# Patient Record
Sex: Male | Born: 1935
Health system: Southern US, Community
[De-identification: ages and names within clinical notes are randomized; demographics above are authoritative.]

## PROBLEM LIST (undated history)

## (undated) DIAGNOSIS — M1990 Unspecified osteoarthritis, unspecified site: Secondary | ICD-10-CM

## (undated) DIAGNOSIS — E119 Type 2 diabetes mellitus without complications: Secondary | ICD-10-CM

## (undated) DIAGNOSIS — C801 Malignant (primary) neoplasm, unspecified: Secondary | ICD-10-CM

## (undated) DIAGNOSIS — J439 Emphysema, unspecified: Secondary | ICD-10-CM

## (undated) DIAGNOSIS — Z95810 Presence of automatic (implantable) cardiac defibrillator: Secondary | ICD-10-CM

## (undated) DIAGNOSIS — E1142 Type 2 diabetes mellitus with diabetic polyneuropathy: Secondary | ICD-10-CM

## (undated) DIAGNOSIS — M1A00X0 Idiopathic chronic gout, unspecified site, without tophus (tophi): Secondary | ICD-10-CM

## (undated) DIAGNOSIS — M1A9XX0 Chronic gout, unspecified, without tophus (tophi): Secondary | ICD-10-CM

## (undated) DIAGNOSIS — I428 Other cardiomyopathies: Secondary | ICD-10-CM

## (undated) DIAGNOSIS — E538 Deficiency of other specified B group vitamins: Secondary | ICD-10-CM

## (undated) DIAGNOSIS — Z8673 Personal history of transient ischemic attack (TIA), and cerebral infarction without residual deficits: Secondary | ICD-10-CM

## (undated) DIAGNOSIS — E785 Hyperlipidemia, unspecified: Secondary | ICD-10-CM

## (undated) DIAGNOSIS — I447 Left bundle-branch block, unspecified: Secondary | ICD-10-CM

## (undated) DIAGNOSIS — I700 Atherosclerosis of aorta: Secondary | ICD-10-CM

## (undated) DIAGNOSIS — I6523 Occlusion and stenosis of bilateral carotid arteries: Secondary | ICD-10-CM

## (undated) DIAGNOSIS — Z9079 Acquired absence of other genital organ(s): Secondary | ICD-10-CM

## (undated) DIAGNOSIS — Z86718 Personal history of other venous thrombosis and embolism: Secondary | ICD-10-CM

## (undated) DIAGNOSIS — I4891 Unspecified atrial fibrillation: Secondary | ICD-10-CM

## (undated) DIAGNOSIS — I509 Heart failure, unspecified: Secondary | ICD-10-CM

## (undated) DIAGNOSIS — Z87442 Personal history of urinary calculi: Secondary | ICD-10-CM

## (undated) DIAGNOSIS — K219 Gastro-esophageal reflux disease without esophagitis: Secondary | ICD-10-CM

## (undated) HISTORY — PX: KNEE ARTHROSCOPY: SUR90

## (undated) HISTORY — PX: CARDIAC DEFIBRILLATOR PLACEMENT: SHX171

## (undated) HISTORY — PX: OTHER SURGICAL HISTORY: SHX169

## (undated) HISTORY — PX: PROSTATECTOMY: SHX69

---

## 1994-01-12 DIAGNOSIS — Z9079 Acquired absence of other genital organ(s): Secondary | ICD-10-CM | POA: Insufficient documentation

## 2005-06-22 ENCOUNTER — Ambulatory Visit: Payer: Self-pay | Admitting: Urology

## 2006-01-25 ENCOUNTER — Ambulatory Visit: Payer: Self-pay | Admitting: Ophthalmology

## 2006-10-10 ENCOUNTER — Emergency Department: Payer: Self-pay | Admitting: Emergency Medicine

## 2007-02-15 ENCOUNTER — Ambulatory Visit: Payer: Self-pay | Admitting: Gastroenterology

## 2007-08-11 ENCOUNTER — Ambulatory Visit: Payer: Self-pay | Admitting: Ophthalmology

## 2007-08-11 ENCOUNTER — Other Ambulatory Visit: Payer: Self-pay

## 2007-08-22 ENCOUNTER — Ambulatory Visit: Payer: Self-pay | Admitting: Ophthalmology

## 2012-01-14 ENCOUNTER — Ambulatory Visit: Payer: Self-pay | Admitting: Cardiology

## 2012-03-29 DIAGNOSIS — Z86718 Personal history of other venous thrombosis and embolism: Secondary | ICD-10-CM | POA: Insufficient documentation

## 2012-03-30 DIAGNOSIS — Z4502 Encounter for adjustment and management of automatic implantable cardiac defibrillator: Secondary | ICD-10-CM | POA: Insufficient documentation

## 2013-08-04 DIAGNOSIS — E1149 Type 2 diabetes mellitus with other diabetic neurological complication: Secondary | ICD-10-CM | POA: Insufficient documentation

## 2014-01-23 DIAGNOSIS — I420 Dilated cardiomyopathy: Secondary | ICD-10-CM | POA: Diagnosis not present

## 2014-02-01 DIAGNOSIS — M1A00X0 Idiopathic chronic gout, unspecified site, without tophus (tophi): Secondary | ICD-10-CM | POA: Diagnosis not present

## 2014-02-01 DIAGNOSIS — E119 Type 2 diabetes mellitus without complications: Secondary | ICD-10-CM | POA: Diagnosis not present

## 2014-02-01 DIAGNOSIS — C61 Malignant neoplasm of prostate: Secondary | ICD-10-CM | POA: Diagnosis not present

## 2014-02-08 DIAGNOSIS — E1149 Type 2 diabetes mellitus with other diabetic neurological complication: Secondary | ICD-10-CM | POA: Diagnosis not present

## 2014-02-08 DIAGNOSIS — M1A00X0 Idiopathic chronic gout, unspecified site, without tophus (tophi): Secondary | ICD-10-CM | POA: Diagnosis not present

## 2014-02-08 DIAGNOSIS — Z8546 Personal history of malignant neoplasm of prostate: Secondary | ICD-10-CM | POA: Insufficient documentation

## 2014-02-08 DIAGNOSIS — Z0000 Encounter for general adult medical examination without abnormal findings: Secondary | ICD-10-CM | POA: Diagnosis not present

## 2014-03-08 DIAGNOSIS — D0439 Carcinoma in situ of skin of other parts of face: Secondary | ICD-10-CM | POA: Diagnosis not present

## 2014-03-08 DIAGNOSIS — L821 Other seborrheic keratosis: Secondary | ICD-10-CM | POA: Diagnosis not present

## 2014-03-08 DIAGNOSIS — L858 Other specified epidermal thickening: Secondary | ICD-10-CM | POA: Diagnosis not present

## 2014-03-08 DIAGNOSIS — L218 Other seborrheic dermatitis: Secondary | ICD-10-CM | POA: Diagnosis not present

## 2014-03-08 DIAGNOSIS — D485 Neoplasm of uncertain behavior of skin: Secondary | ICD-10-CM | POA: Diagnosis not present

## 2014-03-08 DIAGNOSIS — Z85828 Personal history of other malignant neoplasm of skin: Secondary | ICD-10-CM | POA: Diagnosis not present

## 2014-04-11 DIAGNOSIS — C44519 Basal cell carcinoma of skin of other part of trunk: Secondary | ICD-10-CM | POA: Diagnosis not present

## 2014-04-24 DIAGNOSIS — I420 Dilated cardiomyopathy: Secondary | ICD-10-CM | POA: Diagnosis not present

## 2014-04-25 DIAGNOSIS — D0439 Carcinoma in situ of skin of other parts of face: Secondary | ICD-10-CM | POA: Diagnosis not present

## 2014-04-26 DIAGNOSIS — L905 Scar conditions and fibrosis of skin: Secondary | ICD-10-CM | POA: Diagnosis not present

## 2014-05-08 DIAGNOSIS — I5022 Chronic systolic (congestive) heart failure: Secondary | ICD-10-CM | POA: Diagnosis not present

## 2014-05-08 DIAGNOSIS — I447 Left bundle-branch block, unspecified: Secondary | ICD-10-CM | POA: Diagnosis not present

## 2014-05-08 DIAGNOSIS — E1149 Type 2 diabetes mellitus with other diabetic neurological complication: Secondary | ICD-10-CM | POA: Diagnosis not present

## 2014-05-08 DIAGNOSIS — I429 Cardiomyopathy, unspecified: Secondary | ICD-10-CM | POA: Diagnosis not present

## 2014-07-24 DIAGNOSIS — I420 Dilated cardiomyopathy: Secondary | ICD-10-CM | POA: Diagnosis not present

## 2014-08-02 DIAGNOSIS — Z0000 Encounter for general adult medical examination without abnormal findings: Secondary | ICD-10-CM | POA: Diagnosis not present

## 2014-08-02 DIAGNOSIS — E1149 Type 2 diabetes mellitus with other diabetic neurological complication: Secondary | ICD-10-CM | POA: Diagnosis not present

## 2014-08-02 DIAGNOSIS — M1A00X0 Idiopathic chronic gout, unspecified site, without tophus (tophi): Secondary | ICD-10-CM | POA: Diagnosis not present

## 2014-08-09 DIAGNOSIS — E1149 Type 2 diabetes mellitus with other diabetic neurological complication: Secondary | ICD-10-CM | POA: Diagnosis not present

## 2014-08-09 DIAGNOSIS — I429 Cardiomyopathy, unspecified: Secondary | ICD-10-CM | POA: Diagnosis not present

## 2014-08-09 DIAGNOSIS — M1A00X0 Idiopathic chronic gout, unspecified site, without tophus (tophi): Secondary | ICD-10-CM | POA: Diagnosis not present

## 2014-08-15 DIAGNOSIS — L821 Other seborrheic keratosis: Secondary | ICD-10-CM | POA: Diagnosis not present

## 2014-08-15 DIAGNOSIS — L570 Actinic keratosis: Secondary | ICD-10-CM | POA: Diagnosis not present

## 2014-08-15 DIAGNOSIS — D1801 Hemangioma of skin and subcutaneous tissue: Secondary | ICD-10-CM | POA: Diagnosis not present

## 2014-10-16 DIAGNOSIS — J40 Bronchitis, not specified as acute or chronic: Secondary | ICD-10-CM | POA: Diagnosis not present

## 2014-10-16 DIAGNOSIS — J4522 Mild intermittent asthma with status asthmaticus: Secondary | ICD-10-CM | POA: Diagnosis not present

## 2014-10-16 DIAGNOSIS — N3090 Cystitis, unspecified without hematuria: Secondary | ICD-10-CM | POA: Diagnosis not present

## 2014-10-16 DIAGNOSIS — Z23 Encounter for immunization: Secondary | ICD-10-CM | POA: Diagnosis not present

## 2014-10-23 DIAGNOSIS — I420 Dilated cardiomyopathy: Secondary | ICD-10-CM | POA: Diagnosis not present

## 2014-12-18 DIAGNOSIS — J40 Bronchitis, not specified as acute or chronic: Secondary | ICD-10-CM | POA: Diagnosis not present

## 2015-01-22 DIAGNOSIS — I420 Dilated cardiomyopathy: Secondary | ICD-10-CM | POA: Diagnosis not present

## 2015-02-06 DIAGNOSIS — E1149 Type 2 diabetes mellitus with other diabetic neurological complication: Secondary | ICD-10-CM | POA: Diagnosis not present

## 2015-02-13 DIAGNOSIS — Z0000 Encounter for general adult medical examination without abnormal findings: Secondary | ICD-10-CM | POA: Diagnosis not present

## 2015-02-13 DIAGNOSIS — R05 Cough: Secondary | ICD-10-CM | POA: Diagnosis not present

## 2015-02-13 DIAGNOSIS — E1142 Type 2 diabetes mellitus with diabetic polyneuropathy: Secondary | ICD-10-CM | POA: Insufficient documentation

## 2015-02-13 DIAGNOSIS — J40 Bronchitis, not specified as acute or chronic: Secondary | ICD-10-CM | POA: Diagnosis not present

## 2015-02-13 DIAGNOSIS — I5022 Chronic systolic (congestive) heart failure: Secondary | ICD-10-CM | POA: Diagnosis not present

## 2015-03-07 DIAGNOSIS — Z961 Presence of intraocular lens: Secondary | ICD-10-CM | POA: Diagnosis not present

## 2015-04-23 DIAGNOSIS — I420 Dilated cardiomyopathy: Secondary | ICD-10-CM | POA: Diagnosis not present

## 2015-05-02 DIAGNOSIS — D0439 Carcinoma in situ of skin of other parts of face: Secondary | ICD-10-CM | POA: Diagnosis not present

## 2015-05-02 DIAGNOSIS — D485 Neoplasm of uncertain behavior of skin: Secondary | ICD-10-CM | POA: Diagnosis not present

## 2015-05-08 DIAGNOSIS — I447 Left bundle-branch block, unspecified: Secondary | ICD-10-CM | POA: Diagnosis not present

## 2015-05-08 DIAGNOSIS — E1142 Type 2 diabetes mellitus with diabetic polyneuropathy: Secondary | ICD-10-CM | POA: Diagnosis not present

## 2015-05-08 DIAGNOSIS — I5022 Chronic systolic (congestive) heart failure: Secondary | ICD-10-CM | POA: Diagnosis not present

## 2015-05-21 DIAGNOSIS — J40 Bronchitis, not specified as acute or chronic: Secondary | ICD-10-CM | POA: Diagnosis not present

## 2015-05-21 DIAGNOSIS — J0101 Acute recurrent maxillary sinusitis: Secondary | ICD-10-CM | POA: Diagnosis not present

## 2015-05-21 DIAGNOSIS — E1142 Type 2 diabetes mellitus with diabetic polyneuropathy: Secondary | ICD-10-CM | POA: Diagnosis not present

## 2015-05-23 DIAGNOSIS — D0439 Carcinoma in situ of skin of other parts of face: Secondary | ICD-10-CM | POA: Diagnosis not present

## 2015-05-23 DIAGNOSIS — L570 Actinic keratosis: Secondary | ICD-10-CM | POA: Diagnosis not present

## 2015-07-23 DIAGNOSIS — I420 Dilated cardiomyopathy: Secondary | ICD-10-CM | POA: Diagnosis not present

## 2015-08-06 DIAGNOSIS — E1142 Type 2 diabetes mellitus with diabetic polyneuropathy: Secondary | ICD-10-CM | POA: Diagnosis not present

## 2015-08-06 DIAGNOSIS — Z0000 Encounter for general adult medical examination without abnormal findings: Secondary | ICD-10-CM | POA: Diagnosis not present

## 2015-08-13 DIAGNOSIS — E1142 Type 2 diabetes mellitus with diabetic polyneuropathy: Secondary | ICD-10-CM | POA: Diagnosis not present

## 2015-08-23 DIAGNOSIS — D485 Neoplasm of uncertain behavior of skin: Secondary | ICD-10-CM | POA: Diagnosis not present

## 2015-08-23 DIAGNOSIS — L0102 Bockhart's impetigo: Secondary | ICD-10-CM | POA: Diagnosis not present

## 2015-08-23 DIAGNOSIS — Z85828 Personal history of other malignant neoplasm of skin: Secondary | ICD-10-CM | POA: Diagnosis not present

## 2015-08-23 DIAGNOSIS — L570 Actinic keratosis: Secondary | ICD-10-CM | POA: Diagnosis not present

## 2015-08-23 DIAGNOSIS — X32XXXA Exposure to sunlight, initial encounter: Secondary | ICD-10-CM | POA: Diagnosis not present

## 2015-09-11 DIAGNOSIS — L570 Actinic keratosis: Secondary | ICD-10-CM | POA: Diagnosis not present

## 2015-10-14 ENCOUNTER — Inpatient Hospital Stay: Payer: Commercial Managed Care - HMO

## 2015-10-14 ENCOUNTER — Emergency Department: Payer: Commercial Managed Care - HMO

## 2015-10-14 ENCOUNTER — Inpatient Hospital Stay
Admission: EM | Admit: 2015-10-14 | Discharge: 2015-10-15 | DRG: 065 | Disposition: A | Discharge status: Home / Self Care | Payer: Commercial Managed Care - HMO | Attending: Internal Medicine | Admitting: Internal Medicine

## 2015-10-14 DIAGNOSIS — R531 Weakness: Secondary | ICD-10-CM

## 2015-10-14 DIAGNOSIS — R29818 Other symptoms and signs involving the nervous system: Secondary | ICD-10-CM | POA: Diagnosis not present

## 2015-10-14 DIAGNOSIS — I739 Peripheral vascular disease, unspecified: Secondary | ICD-10-CM | POA: Diagnosis present

## 2015-10-14 DIAGNOSIS — I440 Atrioventricular block, first degree: Secondary | ICD-10-CM | POA: Diagnosis present

## 2015-10-14 DIAGNOSIS — I2510 Atherosclerotic heart disease of native coronary artery without angina pectoris: Secondary | ICD-10-CM | POA: Diagnosis present

## 2015-10-14 DIAGNOSIS — R4781 Slurred speech: Secondary | ICD-10-CM | POA: Diagnosis present

## 2015-10-14 DIAGNOSIS — Z87891 Personal history of nicotine dependence: Secondary | ICD-10-CM

## 2015-10-14 DIAGNOSIS — R29701 NIHSS score 1: Secondary | ICD-10-CM | POA: Diagnosis present

## 2015-10-14 DIAGNOSIS — I429 Cardiomyopathy, unspecified: Secondary | ICD-10-CM | POA: Diagnosis not present

## 2015-10-14 DIAGNOSIS — J449 Chronic obstructive pulmonary disease, unspecified: Secondary | ICD-10-CM | POA: Diagnosis present

## 2015-10-14 DIAGNOSIS — R29810 Facial weakness: Secondary | ICD-10-CM | POA: Diagnosis present

## 2015-10-14 DIAGNOSIS — R200 Anesthesia of skin: Secondary | ICD-10-CM | POA: Diagnosis not present

## 2015-10-14 DIAGNOSIS — M109 Gout, unspecified: Secondary | ICD-10-CM | POA: Diagnosis present

## 2015-10-14 DIAGNOSIS — Z7984 Long term (current) use of oral hypoglycemic drugs: Secondary | ICD-10-CM | POA: Diagnosis not present

## 2015-10-14 DIAGNOSIS — I10 Essential (primary) hypertension: Secondary | ICD-10-CM

## 2015-10-14 DIAGNOSIS — N289 Disorder of kidney and ureter, unspecified: Secondary | ICD-10-CM

## 2015-10-14 DIAGNOSIS — N183 Chronic kidney disease, stage 3 (moderate): Secondary | ICD-10-CM | POA: Diagnosis present

## 2015-10-14 DIAGNOSIS — M6281 Muscle weakness (generalized): Secondary | ICD-10-CM

## 2015-10-14 DIAGNOSIS — I6529 Occlusion and stenosis of unspecified carotid artery: Secondary | ICD-10-CM | POA: Diagnosis present

## 2015-10-14 DIAGNOSIS — Z833 Family history of diabetes mellitus: Secondary | ICD-10-CM

## 2015-10-14 DIAGNOSIS — E1122 Type 2 diabetes mellitus with diabetic chronic kidney disease: Secondary | ICD-10-CM | POA: Diagnosis present

## 2015-10-14 DIAGNOSIS — I6359 Cerebral infarction due to unspecified occlusion or stenosis of other cerebral artery: Secondary | ICD-10-CM | POA: Diagnosis not present

## 2015-10-14 DIAGNOSIS — N189 Chronic kidney disease, unspecified: Secondary | ICD-10-CM

## 2015-10-14 DIAGNOSIS — Z23 Encounter for immunization: Secondary | ICD-10-CM | POA: Diagnosis not present

## 2015-10-14 DIAGNOSIS — R42 Dizziness and giddiness: Secondary | ICD-10-CM | POA: Diagnosis not present

## 2015-10-14 DIAGNOSIS — Z8546 Personal history of malignant neoplasm of prostate: Secondary | ICD-10-CM | POA: Diagnosis not present

## 2015-10-14 DIAGNOSIS — I130 Hypertensive heart and chronic kidney disease with heart failure and stage 1 through stage 4 chronic kidney disease, or unspecified chronic kidney disease: Secondary | ICD-10-CM | POA: Diagnosis present

## 2015-10-14 DIAGNOSIS — I509 Heart failure, unspecified: Secondary | ICD-10-CM | POA: Diagnosis present

## 2015-10-14 DIAGNOSIS — I639 Cerebral infarction, unspecified: Secondary | ICD-10-CM | POA: Diagnosis not present

## 2015-10-14 DIAGNOSIS — Z882 Allergy status to sulfonamides status: Secondary | ICD-10-CM

## 2015-10-14 DIAGNOSIS — Z950 Presence of cardiac pacemaker: Secondary | ICD-10-CM | POA: Diagnosis not present

## 2015-10-14 DIAGNOSIS — Z794 Long term (current) use of insulin: Secondary | ICD-10-CM | POA: Diagnosis not present

## 2015-10-14 DIAGNOSIS — I63233 Cerebral infarction due to unspecified occlusion or stenosis of bilateral carotid arteries: Secondary | ICD-10-CM | POA: Diagnosis not present

## 2015-10-14 HISTORY — DX: Type 2 diabetes mellitus without complications: E11.9

## 2015-10-14 HISTORY — DX: Heart failure, unspecified: I50.9

## 2015-10-14 HISTORY — DX: Malignant (primary) neoplasm, unspecified: C80.1

## 2015-10-14 LAB — COMPREHENSIVE METABOLIC PANEL
ALK PHOS: 49 U/L (ref 38–126)
ALT: 21 U/L (ref 17–63)
AST: 22 U/L (ref 15–41)
Albumin: 4.1 g/dL (ref 3.5–5.0)
Anion gap: 6 (ref 5–15)
BUN: 22 mg/dL — ABNORMAL HIGH (ref 6–20)
CALCIUM: 9.1 mg/dL (ref 8.9–10.3)
CO2: 32 mmol/L (ref 22–32)
CREATININE: 1.25 mg/dL — AB (ref 0.61–1.24)
Chloride: 98 mmol/L — ABNORMAL LOW (ref 101–111)
GFR, EST AFRICAN AMERICAN: 59 mL/min — AB (ref >60)
GFR, EST NON AFRICAN AMERICAN: 51 mL/min — AB (ref >60)
Glucose, Bld: 137 mg/dL — ABNORMAL HIGH (ref 65–99)
Potassium: 4.1 mmol/L (ref 3.5–5.1)
Sodium: 136 mmol/L (ref 135–145)
Total Bilirubin: 0.5 mg/dL (ref 0.3–1.2)
Total Protein: 7.1 g/dL (ref 6.5–8.1)

## 2015-10-14 LAB — DIFFERENTIAL
Basophils Absolute: 0.1 10*3/uL (ref 0–0.1)
Basophils Relative: 2 %
Eosinophils Absolute: 0.1 10*3/uL (ref 0–0.7)
Eosinophils Relative: 3 %
LYMPHS ABS: 1.1 10*3/uL (ref 1.0–3.6)
LYMPHS PCT: 25 %
MONO ABS: 0.7 10*3/uL (ref 0.2–1.0)
MONOS PCT: 16 %
NEUTROS ABS: 2.4 10*3/uL (ref 1.4–6.5)
Neutrophils Relative %: 54 %

## 2015-10-14 LAB — LIPID PANEL
Cholesterol: 157 mg/dL (ref 0–200)
HDL: 39 mg/dL — ABNORMAL LOW (ref >40)
LDL CALC: 88 mg/dL (ref 0–99)
TRIGLYCERIDES: 148 mg/dL (ref <150)
Total CHOL/HDL Ratio: 4 RATIO
VLDL: 30 mg/dL (ref 0–40)

## 2015-10-14 LAB — GLUCOSE, CAPILLARY
GLUCOSE-CAPILLARY: 131 mg/dL — AB (ref 65–99)
GLUCOSE-CAPILLARY: 100 mg/dL — AB (ref 65–99)
GLUCOSE-CAPILLARY: 154 mg/dL — AB (ref 65–99)
Glucose-Capillary: 98 mg/dL (ref 65–99)

## 2015-10-14 LAB — PROTIME-INR
INR: 1.04
Prothrombin Time: 13.6 seconds (ref 11.4–15.2)

## 2015-10-14 LAB — URINALYSIS COMPLETE WITH MICROSCOPIC (ARMC ONLY)
BACTERIA UA: NONE SEEN
Bilirubin Urine: NEGATIVE
GLUCOSE, UA: NEGATIVE mg/dL
HGB URINE DIPSTICK: NEGATIVE
Ketones, ur: NEGATIVE mg/dL
LEUKOCYTES UA: NEGATIVE
Nitrite: NEGATIVE
PROTEIN: NEGATIVE mg/dL
Specific Gravity, Urine: 1.015 (ref 1.005–1.030)
WBC UA: NONE SEEN WBC/hpf (ref 0–5)
pH: 5 (ref 5.0–8.0)

## 2015-10-14 LAB — CBC
HEMATOCRIT: 43.5 % (ref 40.0–52.0)
HEMOGLOBIN: 15.2 g/dL (ref 13.0–18.0)
MCH: 34.5 pg — ABNORMAL HIGH (ref 26.0–34.0)
MCHC: 34.9 g/dL (ref 32.0–36.0)
MCV: 98.8 fL (ref 80.0–100.0)
Platelets: 155 10*3/uL (ref 150–440)
RBC: 4.41 MIL/uL (ref 4.40–5.90)
RDW: 14 % (ref 11.5–14.5)
WBC: 4.4 10*3/uL (ref 3.8–10.6)

## 2015-10-14 LAB — APTT: aPTT: 29 seconds (ref 24–36)

## 2015-10-14 LAB — TROPONIN I

## 2015-10-14 MED ORDER — ONDANSETRON HCL 4 MG/2ML IJ SOLN: 4.0000 mg | Freq: Four times a day (QID) | INTRAMUSCULAR | Status: DC | PRN

## 2015-10-14 MED ORDER — INSULIN ASPART 100 UNIT/ML ~~LOC~~ SOLN: 0.0000 [IU] | Freq: Every day | SUBCUTANEOUS | Status: DC

## 2015-10-14 MED ORDER — ALLOPURINOL 300 MG PO TABS
300.0000 mg | ORAL_TABLET | Freq: Every day | ORAL | Status: DC
  Administered 2015-10-14: 300 mg via ORAL
  Filled 2015-10-14: qty 1

## 2015-10-14 MED ORDER — ASPIRIN 81 MG PO CHEW
324.0000 mg | CHEWABLE_TABLET | Freq: Once | ORAL | Status: AC
  Administered 2015-10-14: 324 mg via ORAL
  Filled 2015-10-14: qty 4

## 2015-10-14 MED ORDER — INSULIN ASPART 100 UNIT/ML ~~LOC~~ SOLN: 3.0000 [IU] | Freq: Three times a day (TID) | SUBCUTANEOUS | Status: DC

## 2015-10-14 MED ORDER — SODIUM CHLORIDE 0.9% FLUSH
3.0000 mL | Freq: Two times a day (BID) | INTRAVENOUS | Status: DC
Start: 2015-10-14 — End: 2015-10-15
  Administered 2015-10-14: 16:00:00 3 mL via INTRAVENOUS

## 2015-10-14 MED ORDER — ONDANSETRON HCL 4 MG PO TABS: 4.0000 mg | ORAL_TABLET | Freq: Four times a day (QID) | ORAL | Status: DC | PRN

## 2015-10-14 MED ORDER — METFORMIN HCL 500 MG PO TABS: 1000.0000 mg | ORAL_TABLET | Freq: Every day | ORAL | Status: DC

## 2015-10-14 MED ORDER — PANTOPRAZOLE SODIUM 40 MG PO TBEC
40.0000 mg | DELAYED_RELEASE_TABLET | Freq: Every day | ORAL | Status: DC
  Administered 2015-10-14 – 2015-10-15 (×2): 40 mg via ORAL
  Filled 2015-10-14 (×3): qty 1

## 2015-10-14 MED ORDER — CARVEDILOL 3.125 MG PO TABS
3.1250 mg | ORAL_TABLET | Freq: Every day | ORAL | Status: DC
  Administered 2015-10-14: 23:00:00 3.125 mg via ORAL
  Filled 2015-10-14 (×2): qty 1

## 2015-10-14 MED ORDER — IOPAMIDOL (ISOVUE-370) INJECTION 76%
75.0000 mL | Freq: Once | INTRAVENOUS | Status: AC | PRN
  Administered 2015-10-14: 75 mL via INTRAVENOUS

## 2015-10-14 MED ORDER — SODIUM CHLORIDE 0.45 % IV SOLN
INTRAVENOUS | Status: DC
  Administered 2015-10-14 – 2015-10-15 (×2): via INTRAVENOUS

## 2015-10-14 MED ORDER — INSULIN ASPART 100 UNIT/ML ~~LOC~~ SOLN: 0.0000 [IU] | Freq: Three times a day (TID) | SUBCUTANEOUS | Status: DC

## 2015-10-14 MED ORDER — ENOXAPARIN SODIUM 40 MG/0.4ML ~~LOC~~ SOLN
40.0000 mg | SUBCUTANEOUS | Status: DC
  Administered 2015-10-14 – 2015-10-15 (×2): 40 mg via SUBCUTANEOUS
  Filled 2015-10-14 (×2): qty 0.4

## 2015-10-14 MED ORDER — GLIMEPIRIDE 2 MG PO TABS
2.0000 mg | ORAL_TABLET | Freq: Every day | ORAL | Status: DC
  Administered 2015-10-14: 23:00:00 2 mg via ORAL
  Filled 2015-10-14 (×2): qty 1

## 2015-10-14 MED ORDER — INFLUENZA VAC SPLIT QUAD 0.5 ML IM SUSY
0.5000 mL | PREFILLED_SYRINGE | INTRAMUSCULAR | Status: AC
  Administered 2015-10-15: 0.5 mL via INTRAMUSCULAR
  Filled 2015-10-14: qty 0.5

## 2015-10-14 MED ORDER — DOCUSATE SODIUM 100 MG PO CAPS
100.0000 mg | ORAL_CAPSULE | Freq: Two times a day (BID) | ORAL | Status: DC
  Filled 2015-10-14 (×2): qty 1

## 2015-10-14 MED ORDER — INDAPAMIDE 2.5 MG PO TABS
1.2500 mg | ORAL_TABLET | Freq: Every day | ORAL | Status: DC
  Administered 2015-10-14 – 2015-10-15 (×2): 1.25 mg via ORAL
  Filled 2015-10-14 (×4): qty 1

## 2015-10-14 MED ORDER — ATORVASTATIN CALCIUM 20 MG PO TABS
40.0000 mg | ORAL_TABLET | Freq: Every day | ORAL | Status: DC
  Administered 2015-10-14: 40 mg via ORAL
  Filled 2015-10-14: qty 2

## 2015-10-14 MED ORDER — ASPIRIN EC 325 MG PO TBEC
325.0000 mg | DELAYED_RELEASE_TABLET | Freq: Every day | ORAL | Status: DC
  Administered 2015-10-14 – 2015-10-15 (×2): 325 mg via ORAL
  Filled 2015-10-14 (×2): qty 1

## 2015-10-14 NOTE — ED Triage Notes (Signed)
Pt with right side numbness and  Mild slurred speech.

## 2015-10-14 NOTE — Consult Note (Signed)
Referring Physician: Ether Griffins    Chief Complaint: Right facial weakness, right sided numbness  HPI: Thomas Morton is an 82 y.o. male  s/p pacemaker placement who reports going to bed normal and awakening around midnight with no complaints.  Awakened at about 0330 and noted that his right side was not working right.  Went back to sleep and on awakening around 0530 his symptoms were worse.  Presented for evaluation at that time.  NIHSS of 1.  Date last known well: Date: 10/14/2015 Time last known well: Time: 00:00 tPA Given: No: Outside time window  Past Medical History:  Diagnosis Date  . Cancer [facility])    prostate 2001  . CHF (congestive heart failure) ([place])   . Diabetes mellitus without complication ([place])     History reviewed. No pertinent surgical history.  Family history: Parents deceased.  Mother with colon cancer.  Father and GF with DM.  Brother and sister with Parkinsonism.  Both deceased.    Social History:  reports that he has quit smoking. His smoking use included Cigarettes. He quit after 30.00 years of use. He does not have any smokeless tobacco history on file. His alcohol and drug histories are not on file.  Allergies:  Allergies  Allergen Reactions  . Sulfa Antibiotics Rash    Medications:  I have reviewed the patient's current medications. Prior to Admission:  Prescriptions Prior to Admission  Medication Sig Dispense Refill Last Dose  . allopurinol (ZYLOPRIM) 300 MG tablet Take 1 tablet by mouth at bedtime.   10/13/2015 at 2000  . carvedilol (COREG) 3.125 MG tablet Take 1 tablet by mouth at bedtime.   10/13/2015 at 2000  . etodolac (LODINE) 400 MG tablet Take 1 tablet by mouth daily.   10/13/2015 at 2000  . glimepiride (AMARYL) 2 MG tablet Take 1 tablet by mouth at bedtime.   10/13/2015 at 2000  . indapamide (LOZOL) 1.25 MG tablet 1 tablet daily.   10/13/2015 at 2000  . metFORMIN (GLUCOPHAGE) 1000 MG tablet Take 1 tablet by mouth at bedtime.   10/13/2015 at 2000  .  pantoprazole (PROTONIX) 40 MG tablet Take 1 tablet by mouth as needed.   prn at prn  . triamcinolone (KENALOG) 0.025 % cream Apply 1 application topically as needed.   prn at prn   Scheduled: . allopurinol  300 mg Oral QHS  . aspirin EC  325 mg Oral Daily  . atorvastatin  40 mg Oral q1800  . carvedilol  3.125 mg Oral QHS  . docusate sodium  100 mg Oral BID  . enoxaparin (LOVENOX) injection  40 mg Subcutaneous Q24H  . glimepiride  2 mg Oral QHS  . indapamide  1.25 mg Oral Daily  . [START ON 10/15/2015] Influenza vac split quadrivalent PF  0.5 mL Intramuscular Tomorrow-1000  . insulin aspart  0-5 Units Subcutaneous QHS  . insulin aspart  0-9 Units Subcutaneous TID WC  . insulin aspart  3 Units Subcutaneous TID WC  . metFORMIN  1,000 mg Oral QHS  . pantoprazole  40 mg Oral Daily  . sodium chloride flush  3 mL Intravenous Q12H    ROS: History obtained from the patient  General ROS: negative for - chills, fatigue, fever, night sweats, weight gain or weight loss Psychological ROS: negative for - behavioral disorder, hallucinations, memory difficulties, mood swings or suicidal ideation Ophthalmic ROS: negative for - blurry vision, double vision, eye pain or loss of vision ENT ROS: negative for - epistaxis, nasal discharge, oral lesions,  sore throat, tinnitus or vertigo Allergy and Immunology ROS: negative for - hives or itchy/watery eyes Hematological and Lymphatic ROS: negative for - bleeding problems, bruising or swollen lymph nodes Endocrine ROS: negative for - galactorrhea, hair pattern changes, polydipsia/polyuria or temperature intolerance Respiratory ROS: negative for - cough, hemoptysis, shortness of breath or wheezing Cardiovascular ROS: negative for - chest pain, dyspnea on exertion, edema or irregular heartbeat Gastrointestinal ROS: negative for - abdominal pain, diarrhea, hematemesis, nausea/vomiting or stool incontinence Genito-Urinary ROS: negative for - dysuria, hematuria,  incontinence or urinary frequency/urgency Musculoskeletal ROS: negative for - joint swelling or muscular weakness Neurological ROS: as noted in HPI Dermatological ROS: negative for rash and skin lesion changes  Physical Examination: Blood pressure (!) 156/75, pulse (!) 59, temperature 97.7 F (36.5 C), temperature source Oral, resp. rate 20, height 5\' 10"  (1.778 m), weight 85.4 kg (188 lb 4.8 oz), SpO2 98 %.  HEENT-  Normocephalic, no lesions, without obvious abnormality.  Normal external eye and conjunctiva.  Normal TM's bilaterally.  Normal auditory canals and external ears. Normal external nose, mucus membranes and septum.  Normal pharynx. Cardiovascular- S1, S2 normal, pulses palpable throughout   Lungs- chest clear, no wheezing, rales, normal symmetric air entry Abdomen- soft, non-tender; bowel sounds normal; no masses,  no organomegaly Extremities- no edema Lymph-no adenopathy palpable Musculoskeletal-no joint tenderness, deformity or swelling Skin-warm and dry, no hyperpigmentation, vitiligo, or suspicious lesions  Neurological Examination Mental Status: Alert, oriented, thought content appropriate.  Speech fluent without evidence of aphasia.  Able to follow 3 step commands without difficulty. Cranial Nerves: II: Discs flat bilaterally; Visual fields grossly normal, pupils equal, round, reactive to light and accommodation III,IV, VI: ptosis not present, extra-ocular motions intact bilaterally V,VII: decreased right NLF, facial light touch sensation normal bilaterally VIII: hearing normal bilaterally IX,X: gag reflex present XI: bilateral shoulder shrug XII: midline tongue extension Motor: Right : Upper extremity   5/5    Left:     Upper extremity   5/5  Lower extremity   5/5     Lower extremity   5/5 Tone and bulk:normal tone throughout; no atrophy noted Sensory: Pinprick and light touch intact throughout, bilaterally Deep Tendon Reflexes: 2+ and symmetric with absent AJ's  bilaterally Plantars: Right: upgoing   Left: downgoing Cerebellar: Normal finger-to-nose and normal heel-to-shin testing bilaterally Gait: not tested due to safety concerns    Laboratory Studies:  Basic Metabolic Panel:  Recent Labs Lab 10/14/15 0724  NA 136  K 4.1  CL 98*  CO2 32  GLUCOSE 137*  BUN 22*  CREATININE 1.25*  CALCIUM 9.1    Liver Function Tests:  Recent Labs Lab 10/14/15 0724  AST 22  ALT 21  ALKPHOS 49  BILITOT 0.5  PROT 7.1  ALBUMIN 4.1   No results for input(s): LIPASE, AMYLASE in the last 168 hours. No results for input(s): AMMONIA in the last 168 hours.  CBC:  Recent Labs Lab 10/14/15 0724  WBC 4.4  NEUTROABS 2.4  HGB 15.2  HCT 43.5  MCV 98.8  PLT 155    Cardiac Enzymes:  Recent Labs Lab 10/14/15 0724  TROPONINI <0.03    BNP: Invalid input(s): POCBNP  CBG:  Recent Labs Lab 10/14/15 0740 10/14/15 1139  GLUCAP 131* 100*    Microbiology: No results found for this or any previous visit.  Coagulation Studies:  Recent Labs  10/14/15 0724  LABPROT 13.6  INR 1.04    Urinalysis:  Recent Labs Lab 10/14/15 0851  COLORURINE YELLOW*  LABSPEC 1.015  PHURINE 5.0  GLUCOSEU NEGATIVE  HGBUR NEGATIVE  BILIRUBINUR NEGATIVE  KETONESUR NEGATIVE  PROTEINUR NEGATIVE  NITRITE NEGATIVE  LEUKOCYTESUR NEGATIVE    Lipid Panel:    Component Value Date/Time   CHOL 157 10/14/2015 0724   TRIG 148 10/14/2015 0724   HDL 39 (L) 10/14/2015 0724   CHOLHDL 4.0 10/14/2015 0724   VLDL 30 10/14/2015 0724   LDLCALC 88 10/14/2015 0724    HgbA1C: No results found for: HGBA1C  Urine Drug Screen:  No results found for: LABOPIA, COCAINSCRNUR, LABBENZ, AMPHETMU, THCU, LABBARB  Alcohol Level: No results for input(s): ETH in the last 168 hours.  Other results: EKG: Atrioventricular paced rhythm at 60 bpm.    Imaging: Ct Angio Head W Or Wo Contrast  Result Date: 10/14/2015 CLINICAL DATA:  82 year old male code stroke, right side  weakness numbness and slurred speech. Unknown time of ictus. Initial encounter. EXAM: CT ANGIOGRAPHY HEAD AND NECK TECHNIQUE: Multidetector CT imaging of the head and neck was performed using the standard protocol during bolus administration of intravenous contrast. Multiplanar CT image reconstructions and MIPs were obtained to evaluate the vascular anatomy. Carotid stenosis measurements (when applicable) are obtained utilizing NASCET criteria, using the distal internal carotid diameter as the denominator. CONTRAST:  75 mL Isovue 370 COMPARISON:  Head CT without contrast 0730 hours today. FINDINGS: CTA NECK Skeleton: No acute osseous abnormality identified. Visualized paranasal sinuses and mastoids are stable and well pneumatized. Upper chest: Left chest cardiac pacemaker or AICD partially visible. Widespread pulmonary subpleural reticular opacity suggestive of chronic lung disease. Central airways are patent. No superior mediastinal lymphadenopathy. Other neck: Negative thyroid, larynx, pharynx, parapharyngeal spaces, retropharyngeal space, sublingual space, submandibular glands and parotid glands. Postoperative changes to the globes. No cervical lymphadenopathy. Aortic arch: 3 vessel arch configuration. Mild calcified arch atherosclerosis. Right carotid system: No brachiocephalic artery or right CCA origin stenosis. Soft and calcified plaque at the right carotid bifurcation and involving the right ICA origin and bulb. Subsequent stenosis is less than 50 % with respect to the distal vessel, because there is a somewhat diminutive caliber of the cervical right ICA throughout its course, never larger than about 4 mm diameter. Severe tortuosity of the vessel just below the skullbase, but no stenosis. Left carotid system: No left CCA origin stenosis despite calcified plaque. Mild soft plaque in the left CCA proximal to the bifurcation. At the bifurcation there is soft and calcified plaque involving the left ICA origin  and lateral bulb, but no subsequent stenosis. Negative cervical left ICA otherwise aside from tortuosity just below the skullbase. The left ICA has a more normal caliber measuring about 5 mm diameter. Vertebral arteries:No proximal right subclavian artery stenosis. Soft plaque at the right vertebral artery origin without stenosis. Negative right vertebral artery to the skullbase. No proximal left subclavian artery stenosis despite soft and calcified plaque. Normal left vertebral artery origin. Mildly tortuous V1 segment which is also partially obscured by dense paravertebral venous contrast reflux. Otherwise negative left vertebral artery to the skullbase. CTA HEAD Posterior circulation: Codominant distal vertebral arteries are normal to the vertebrobasilar junction. Both PICA origins are patent. No basilar artery stenosis. Mild basilar artery irregularity. Normal SCA and PCA origins. However, there is right distal P1 and P2 segment irregularity and mild stenosis best seen on series 10, image 26. Bilateral PCA branches otherwise are normal. Anterior circulation: Both ICA siphons are patent. Moderate left and mild right ICA siphon calcified plaque without stenosis. The right siphon appears  non dominant and somewhat diminutive compared to the left. Normal ophthalmic artery origins. Posterior communicating arteries are diminutive or absent. Patent carotid termini. The right ACA A1 segment is diminutive or absent. The right A2 is primarily supplied from the left. Normal MCA and left ACA origins. Anterior communicating artery and bilateral ACA branches are within normal limits. Left MCA M1 segment is tortuous but otherwise normal. Left MCA bifurcation is patent. There is a simple fide left MCA branching pattern, left MCA branches are within normal limits. Right MCA M1 segment is tortuous but otherwise normal. Right MCA branches are normal. Venous sinuses: Patent. Anatomic variants: Diminutive or absent right ACA A1  segment. This probably in part explains the non dominant, diminutive appearance of the right ICA. Delayed phase: Stable gray-white matter differentiation throughout the brain. No acute or evolving cortically based infarct identified. No abnormal enhancement identified. Review of the MIP images confirms the above findings IMPRESSION: 1. Negative for emergent large vessel occlusion. 2. Right greater than left carotid bifurcation and proximal ICA atherosclerosis is not hemodynamically significant. Non dominant, diminutive appearing right ICA. ICA siphon calcified plaque without stenosis. 3. Negative posterior circulation except for proximal right PCA irregularity and mild stenosis. 4.  Stable CT appearance of the brain. Electronically Signed   By: Genevie Ann M.D.   On: 10/14/2015 12:55   Ct Angio Neck W Or Wo Contrast  Result Date: 10/14/2015 CLINICAL DATA:  82 year old male code stroke, right side weakness numbness and slurred speech. Unknown time of ictus. Initial encounter. EXAM: CT ANGIOGRAPHY HEAD AND NECK TECHNIQUE: Multidetector CT imaging of the head and neck was performed using the standard protocol during bolus administration of intravenous contrast. Multiplanar CT image reconstructions and MIPs were obtained to evaluate the vascular anatomy. Carotid stenosis measurements (when applicable) are obtained utilizing NASCET criteria, using the distal internal carotid diameter as the denominator. CONTRAST:  75 mL Isovue 370 COMPARISON:  Head CT without contrast 0730 hours today. FINDINGS: CTA NECK Skeleton: No acute osseous abnormality identified. Visualized paranasal sinuses and mastoids are stable and well pneumatized. Upper chest: Left chest cardiac pacemaker or AICD partially visible. Widespread pulmonary subpleural reticular opacity suggestive of chronic lung disease. Central airways are patent. No superior mediastinal lymphadenopathy. Other neck: Negative thyroid, larynx, pharynx, parapharyngeal spaces,  retropharyngeal space, sublingual space, submandibular glands and parotid glands. Postoperative changes to the globes. No cervical lymphadenopathy. Aortic arch: 3 vessel arch configuration. Mild calcified arch atherosclerosis. Right carotid system: No brachiocephalic artery or right CCA origin stenosis. Soft and calcified plaque at the right carotid bifurcation and involving the right ICA origin and bulb. Subsequent stenosis is less than 50 % with respect to the distal vessel, because there is a somewhat diminutive caliber of the cervical right ICA throughout its course, never larger than about 4 mm diameter. Severe tortuosity of the vessel just below the skullbase, but no stenosis. Left carotid system: No left CCA origin stenosis despite calcified plaque. Mild soft plaque in the left CCA proximal to the bifurcation. At the bifurcation there is soft and calcified plaque involving the left ICA origin and lateral bulb, but no subsequent stenosis. Negative cervical left ICA otherwise aside from tortuosity just below the skullbase. The left ICA has a more normal caliber measuring about 5 mm diameter. Vertebral arteries:No proximal right subclavian artery stenosis. Soft plaque at the right vertebral artery origin without stenosis. Negative right vertebral artery to the skullbase. No proximal left subclavian artery stenosis despite soft and calcified plaque. Normal left vertebral  artery origin. Mildly tortuous V1 segment which is also partially obscured by dense paravertebral venous contrast reflux. Otherwise negative left vertebral artery to the skullbase. CTA HEAD Posterior circulation: Codominant distal vertebral arteries are normal to the vertebrobasilar junction. Both PICA origins are patent. No basilar artery stenosis. Mild basilar artery irregularity. Normal SCA and PCA origins. However, there is right distal P1 and P2 segment irregularity and mild stenosis best seen on series 10, image 26. Bilateral PCA branches  otherwise are normal. Anterior circulation: Both ICA siphons are patent. Moderate left and mild right ICA siphon calcified plaque without stenosis. The right siphon appears non dominant and somewhat diminutive compared to the left. Normal ophthalmic artery origins. Posterior communicating arteries are diminutive or absent. Patent carotid termini. The right ACA A1 segment is diminutive or absent. The right A2 is primarily supplied from the left. Normal MCA and left ACA origins. Anterior communicating artery and bilateral ACA branches are within normal limits. Left MCA M1 segment is tortuous but otherwise normal. Left MCA bifurcation is patent. There is a simple fide left MCA branching pattern, left MCA branches are within normal limits. Right MCA M1 segment is tortuous but otherwise normal. Right MCA branches are normal. Venous sinuses: Patent. Anatomic variants: Diminutive or absent right ACA A1 segment. This probably in part explains the non dominant, diminutive appearance of the right ICA. Delayed phase: Stable gray-white matter differentiation throughout the brain. No acute or evolving cortically based infarct identified. No abnormal enhancement identified. Review of the MIP images confirms the above findings IMPRESSION: 1. Negative for emergent large vessel occlusion. 2. Right greater than left carotid bifurcation and proximal ICA atherosclerosis is not hemodynamically significant. Non dominant, diminutive appearing right ICA. ICA siphon calcified plaque without stenosis. 3. Negative posterior circulation except for proximal right PCA irregularity and mild stenosis. 4.  Stable CT appearance of the brain. Electronically Signed   By: Genevie Ann M.D.   On: 10/14/2015 12:55   Dg Chest Portable 1 View  Result Date: 10/14/2015 CLINICAL DATA:  Onset of right-sided numbness and slurred speech this morning. EXAM: PORTABLE CHEST 1 VIEW COMPARISON:  Report of a chest x-ray dated February 13, 2015 FINDINGS: The lungs are  well-expanded. There is scarring or subsegmental atelectasis lateral to the cardiac apex. The heart and pulmonary vascularity are normal. The ICD is in reasonable position. There is calcification in the wall of the aortic arch. There is no pleural effusion. IMPRESSION: Scarring at the left lung base which by report is stable. No CHF, pneumonia, nor other acute cardiopulmonary abnormality. Aortic atherosclerosis. Electronically Signed   By: David  Martinique M.D.   On: 10/14/2015 08:08   Ct Head Code Stroke W/o Cm  Result Date: 10/14/2015 CLINICAL DATA:  Code stroke. Patient awoke with RIGHT-sided numbness and slurred speech, time of onset therefore unknown. EXAM: CT HEAD WITHOUT CONTRAST TECHNIQUE: Contiguous axial images were obtained from the base of the skull through the vertex without intravenous contrast. COMPARISON:  None. FINDINGS: Brain: No evidence of acute infarction, hemorrhage, hydrocephalus, or mass lesion/mass effect. Mild atrophy, not unexpected for age. Hypoattenuation of white matter, likely small vessel disease. Slight prominence of the extra-axial CSF spaces over the parietal convexity, likely represent cerebral volume loss, although small hygromas could have a similar appearance. Vascular: No hyperdense vessel. Mild vascular calcification in the carotid siphons, normal for age. Skull: Normal. Negative for fracture or focal lesion. Sinuses/Orbits: No acute finding.  BILATERAL cataract extraction. Other: None. ASPECTS [facility] Stroke Program Early CT Score) -  Ganglionic level infarction (caudate, lentiform nuclei, internal capsule, insula, M1-M3 cortex): 7 - Supraganglionic infarction (M4-M6 cortex): 3 Total score (0-10 with 10 being normal): 10 IMPRESSION: 1. No evidence for acute stroke or hemorrhage. Atrophy and small vessel disease. Slight prominence of the extra-axial CSF spaces likely reflects cerebral volume loss. 2. ASPECTS is 10. These results were called by telephone at the time of  interpretation on 10/14/2015 at 7:40 am to Dr. Hinda Kehr , who verbally acknowledged these results. Electronically Signed   By: Staci Righter M.D.   On: 10/14/2015 07:42    Assessment: 82 y.o. male with new onset facial droop and right sided numbness.  Symptoms have improved but continues to have facial droop.  Head CT personally reviewed and shows no acute changes.  Patient with DM and small vessel disease which I suspect is etiology of patient's symptoms.  Suspect acute infarct.  MRI unable to be performed secondary to pacer.  CTA shows no large vessel occlusion.  A1c pending.  LDL 88.  On no antiplatelet therapy at home.    Stroke Risk Factors - diabetes mellitus  Plan: 1. HgbA1c, fasting lipid panel 2. PT consult, OT consult, Speech consult 3. Echocardiogram 4. Prophylactic therapy-Antiplatelet med: Aspirin - dose 325mg  daily 5. NPO until RN stroke swallow screen 6. Telemetry monitoring 7. Frequent neuro checks 8. Agressive lipid management with target LDL<70.   Alexis Goodell, MD Neurology [phone] 10/14/2015, 2:19 PM

## 2015-10-14 NOTE — Progress Notes (Signed)
Chaplain was referred to patient by the on-call chaplain who asked for follow-up with code stroke patient who was in Ed. Chaplain visited patient to continue with care and support for patient who was under observation. Patient cracked jokes and was optimistic about getting better and asked prayers for a quick recovery. Chaplain provided spiritual support, prayers for healing and presence.      10/14/15 1500  Clinical Encounter Type  Visited With Patient  Visit Type Follow-up;Spiritual support  Referral From Chaplain  Consult/Referral To Chaplain;Nurse  Spiritual Encounters  Spiritual Needs Prayer

## 2015-10-14 NOTE — ED Provider Notes (Signed)
[facility] Emergency Department Provider Note   ____________________________________________   First MD Initiated Contact with Patient 10/14/15 [phone]     (approximate)  I have reviewed the triage vital signs and the nursing notes.   HISTORY  Chief Complaint Code Stroke    HPI Thomas Morton is a 82 y.o. male with history of tach 2 diabetes, history of first-degree AV block status post AICD/pacemeker placement, CKD, gout, nonischemic cardiomyopathy with EF of 16%, not chronically anti-coagulated who presents for evaluation of right sided numbness and weakness noted when he awakened at 3:30 AM to go to the bathroom, constant, mild to moderate, no modifying factors. Patient went to bed in his usual state of health at approximate 9 PM last night, when he awoke at 3:30 AM to use the bathroom, he noted the symptoms. He denies any headache, vision change, chest pain or difficulty breathing. He denies any recent illness.   No past medical history on file.  There are no active problems to display for this patient.   No past surgical history on file.  Prior to Admission medications   Medication Sig Start Date End Date Taking? Authorizing Provider  allopurinol (ZYLOPRIM) 300 MG tablet Take 1 tablet by mouth at bedtime. 02/13/15  Yes Historical Provider, MD  carvedilol (COREG) 3.125 MG tablet Take 1 tablet by mouth at bedtime. 02/13/15  Yes Historical Provider, MD  etodolac (LODINE) 400 MG tablet Take 1 tablet by mouth daily. 02/13/15 02/13/16 Yes Historical Provider, MD  glimepiride (AMARYL) 2 MG tablet Take 1 tablet by mouth at bedtime.   Yes Historical Provider, MD  indapamide (LOZOL) 1.25 MG tablet 1 tablet daily. 12/24/14  Yes Historical Provider, MD  metFORMIN (GLUCOPHAGE) 1000 MG tablet Take 1 tablet by mouth at bedtime. 02/13/15  Yes Historical Provider, MD  pantoprazole (PROTONIX) 40 MG tablet Take 1 tablet by mouth as needed. 02/13/15  Yes Historical Provider, MD    triamcinolone (KENALOG) 0.025 % cream Apply 1 application topically as needed. 11/24/13  Yes Historical Provider, MD    Allergies Sulfa antibiotics  No family history on file.  Social History Social History  Substance Use Topics  . Smoking status: Not on file  . Smokeless tobacco: Not on file  . Alcohol use Not on file    Review of Systems Constitutional: No fever/chills Eyes: No visual changes. ENT: No sore throat. Cardiovascular: Denies chest pain. Respiratory: Denies shortness of breath. Gastrointestinal: No abdominal pain.  No nausea, no vomiting.  No diarrhea.  No constipation. Genitourinary: Negative for dysuria. Musculoskeletal: Negative for back pain. Skin: Negative for rash. Neurological: Negative for headaches, +focal weakness and numbness.  10-point ROS otherwise negative.  ____________________________________________   PHYSICAL EXAM:  VITAL SIGNS: ED Triage Vitals  Enc Vitals Group     BP 10/14/15 0715 (!) 152/58     Pulse Rate 10/14/15 0715 (!) 59     Resp 10/14/15 0715 18     Temp 10/14/15 0715 97.3 F (36.3 C)     Temp Source 10/14/15 0715 Oral     SpO2 10/14/15 0715 94 %     Weight 10/14/15 0716 200 lb (90.7 kg)     Height 10/14/15 0716 5\' 10"  (1.778 m)     Head Circumference --      Peak Flow --      Pain Score --      Pain Loc --      Pain Edu? --      Excl. in  GC? --     Constitutional: Alert and oriented. Well appearing and in no acute distress. Eyes: Conjunctivae are normal. PERRL. EOMI. Head: Atraumatic. Nose: No congestion/rhinnorhea. Mouth/Throat: Mucous membranes are moist.  Oropharynx non-erythematous. Neck: No stridor.  Supple without meningismus. Cardiovascular: Normal rate, regular rhythm. Grossly normal heart sounds.  Good peripheral circulation. Respiratory: Normal respiratory effort.  No retractions. Lungs CTAB. Gastrointestinal: Soft and nontender. No distention.  No CVA tenderness. Genitourinary:  deferred Musculoskeletal: No lower extremity tenderness nor edema.  No joint effusions. Neurologic:  Normal speech and language. Decreased sensation to light touch in the right face, right arm and right leg however sensation intact to light touch on the left. 5 out of 5 strength in bilateral upper and lower extremities. Cranial nerves II through XII otherwise appear intact with the exception of a decreased sensation in the right face which could reflect a cranial nerve VII involvement. Skin:  Skin is warm, dry and intact. No rash noted. Psychiatric: Mood and affect are normal. Speech and behavior are normal.  ____________________________________________   LABS (all labs ordered are listed, but only abnormal results are displayed)  Labs Reviewed  CBC - Abnormal; Notable for the following:       Result Value   MCH 34.5 (*)    All other components within normal limits  COMPREHENSIVE METABOLIC PANEL - Abnormal; Notable for the following:    Chloride 98 (*)    Glucose, Bld 137 (*)    BUN 22 (*)    Creatinine, Ser 1.25 (*)    GFR calc non Af Amer 51 (*)    GFR calc Af Amer 59 (*)    All other components within normal limits  GLUCOSE, CAPILLARY - Abnormal; Notable for the following:    Glucose-Capillary 131 (*)    All other components within normal limits  PROTIME-INR  APTT  DIFFERENTIAL  TROPONIN I  URINALYSIS COMPLETEWITH MICROSCOPIC ([facility] ONLY)  CBG MONITORING, ED   ____________________________________________  EKG  ED ECG REPORT I, Joanne Gavel, the attending physician, personally viewed and interpreted this ECG.   Date: 10/14/2015  EKG Time: 07:37  Rate: 60  Rhythm: Atrioventricular paced rhythm  ____________________________________________  RADIOLOGY  CT head IMPRESSION:  1. No evidence for acute stroke or hemorrhage.    Atrophy and small vessel disease. Slight prominence of the  extra-axial CSF spaces likely reflects cerebral volume loss.    2. ASPECTS is  10.    These results were called by telephone at the time of interpretation  on 10/14/2015 at 7:40 am to Dr. Hinda Kehr , who verbally  acknowledged these results.     CXR IMPRESSION:  Scarring at the left lung base which by report is stable. No CHF,  pneumonia, nor other acute cardiopulmonary abnormality.    Aortic atherosclerosis.    ____________________________________________   PROCEDURES  Procedure(s) performed: None  Procedures  Critical Care performed: No  ____________________________________________   INITIAL IMPRESSION / ASSESSMENT AND PLAN / ED COURSE  Pertinent labs & imaging results that were available during my care of the patient were reviewed by me and considered in my medical decision making (see chart for details).  Thomas Morton is a 82 y.o. male with history of tach 2 diabetes, history of first-degree AV block status post AICD/pacemeker placement, CKD, gout, nonischemic cardiomyopathy with EF of 16%, not chronically anti-coagulated who presents for evaluation of right sided numbness and weakness noted when he awakened at 3:30 AM to go to the bathroom. On  arrival to the emergency department, code stroke initiated. NIH stroke scale 1, symptoms noted on awakening, not a candidate for TPA and teleneurologist agrees. CT head shows no acute intracranial process. Chest x-ray shows no acute cardio pulmonary abnormality. Labs are generally reassuring with the exception of a very mild creatinine elevation at 1.25. We'll give aspirin. Case discussed with the hospitalist for admission at 9 AM. Urinalysis pending.  Clinical Course     ____________________________________________   FINAL CLINICAL IMPRESSION(S) / ED DIAGNOSES  Final diagnoses:  Cerebrovascular accident (CVA), unspecified mechanism ([place])      NEW MEDICATIONS STARTED DURING THIS VISIT:  New Prescriptions   No medications on file     Note:  This document was prepared using Dragon voice  recognition software and may include unintentional dictation errors.    Joanne Gavel, MD 10/14/15 [phone]

## 2015-10-14 NOTE — Progress Notes (Signed)
OT Cancellation Note  Patient Details Name: KAISEI KERCHNER MRN: [id number] DOB: 1927/07/06   Cancelled Treatment:    Reason Eval/Treat Not Completed: Patient at procedure or test/ unavailable. Attempted. Pt. With nursing, and Lab waiting to see pt.   Harrel Carina, MS, OTR/L 10/14/2015, 11:17 AM

## 2015-10-14 NOTE — H&P (Signed)
[name] NAME: Thomas Morton    MR#:  [id number]  DATE OF BIRTH:  04-23-1927  DATE OF ADMISSION:  10/14/2015  PRIMARY CARE PHYSICIAN: Rusty Aus, MD   REQUESTING/REFERRING PHYSICIAN:   CHIEF COMPLAINT:   Chief Complaint  Patient presents with  . Code Stroke    HISTORY OF PRESENT ILLNESS: Thomas Morton  is a 82 y.o. male with a known history of former tobacco abuse for 30 years, essential hypertension, permanent pacemaker placement, coronary artery disease, diabetes, who presents to the hospital with complaints of right-sided numbness and weakness. Currently, patient woke up at around 3:30 at night and noted that his right side and bloating face, arm and leg are numb and weak, he was dragging his right lower extremity to walk to the bathroom. He has been not feeling well for the past 1-2 days, feeling lightheaded and dizzy, unsteady on his feet. During today's presentation. He denied any speech problems, however, patient's son admits of slurring speech. He denied any swelling, vision or other abnormalities. Hospitalist services were contacted for admission. Noncontrast CT of head was unremarkable.  PAST MEDICAL HISTORY:  No past medical history on file.  PAST SURGICAL HISTORY: No past surgical history on file.  SOCIAL HISTORY:  Social History  Substance Use Topics  . Smoking status: Not on file  . Smokeless tobacco: Not on file  . Alcohol use Not on file    FAMILY HISTORY: No family history on file.  DRUG ALLERGIES:  Allergies  Allergen Reactions  . Sulfa Antibiotics Rash    Review of Systems  Constitutional: Positive for malaise/fatigue. Negative for chills, fever and weight loss.  HENT: Negative for congestion.   Eyes: Negative for blurred vision and double vision.  Respiratory: Negative for cough, sputum production, shortness of breath and wheezing.   Cardiovascular: Negative for chest pain, palpitations,  orthopnea, leg swelling and PND.  Gastrointestinal: Negative for abdominal pain, blood in stool, constipation, diarrhea, nausea and vomiting.  Genitourinary: Negative for dysuria, frequency, hematuria and urgency.  Musculoskeletal: Negative for falls.  Neurological: Positive for dizziness, sensory change and focal weakness. Negative for tremors and headaches.  Endo/Heme/Allergies: Does not bruise/bleed easily.  Psychiatric/Behavioral: Negative for depression. The patient does not have insomnia.     MEDICATIONS AT HOME:  Prior to Admission medications   Medication Sig Start Date End Date Taking? Authorizing Provider  allopurinol (ZYLOPRIM) 300 MG tablet Take 1 tablet by mouth at bedtime. 02/13/15  Yes Historical Provider, MD  carvedilol (COREG) 3.125 MG tablet Take 1 tablet by mouth at bedtime. 02/13/15  Yes Historical Provider, MD  etodolac (LODINE) 400 MG tablet Take 1 tablet by mouth daily. 02/13/15 02/13/16 Yes Historical Provider, MD  glimepiride (AMARYL) 2 MG tablet Take 1 tablet by mouth at bedtime.   Yes Historical Provider, MD  indapamide (LOZOL) 1.25 MG tablet 1 tablet daily. 12/24/14  Yes Historical Provider, MD  metFORMIN (GLUCOPHAGE) 1000 MG tablet Take 1 tablet by mouth at bedtime. 02/13/15  Yes Historical Provider, MD  pantoprazole (PROTONIX) 40 MG tablet Take 1 tablet by mouth as needed. 02/13/15  Yes Historical Provider, MD  triamcinolone (KENALOG) 0.025 % cream Apply 1 application topically as needed. 11/24/13  Yes Historical Provider, MD      PHYSICAL EXAMINATION:   VITAL SIGNS: Blood pressure (!) 159/89, pulse 71, temperature 97.3 F (36.3 C), resp. rate 17, height 5\' 10"  (1.778 m), weight 90.7 kg (200 lb), SpO2 98 %.  GENERAL:  82 y.o.-year-old patient lying in the bed with no acute distress.  EYES: Pupils equal, round, reactive to light and accommodation. No scleral icterus. Extraocular muscles intact.  HEENT: Head atraumatic, normocephalic. Oropharynx and nasopharynx clear.   NECK:  Supple, no jugular venous distention. No thyroid enlargement, no tenderness.  LUNGS: Normal breath sounds bilaterally, no wheezing, rales,rhonchi or crepitation. No use of accessory muscles of respiration.  CARDIOVASCULAR: S1, S2 normal. No murmurs, rubs, or gallops.  ABDOMEN: Soft, nontender, nondistended. Bowel sounds present. No organomegaly or mass.  EXTREMITIES: No pedal edema, cyanosis, or clubbing.  NEUROLOGIC: Cranial nerves right facial weakness, lower face, for this., tongue is deviated to the left. Muscle strength 5/5 in all extremities. Sensation decreased to light touch in the right face, arm and leg. Gait not checked. Slurred speech PSYCHIATRIC: The patient is alert and oriented x 3.  SKIN: No obvious rash, lesion, or ulcer.   LABORATORY PANEL:   CBC  Recent Labs Lab 10/14/15 0724  WBC 4.4  HGB 15.2  HCT 43.5  PLT 155  MCV 98.8  MCH 34.5*  MCHC 34.9  RDW 14.0  LYMPHSABS 1.1  MONOABS 0.7  EOSABS 0.1  BASOSABS 0.1   ------------------------------------------------------------------------------------------------------------------  Chemistries   Recent Labs Lab 10/14/15 0724  NA 136  K 4.1  CL 98*  CO2 32  GLUCOSE 137*  BUN 22*  CREATININE 1.25*  CALCIUM 9.1  AST 22  ALT 21  ALKPHOS 49  BILITOT 0.5   ------------------------------------------------------------------------------------------------------------------  Cardiac Enzymes  Recent Labs Lab 10/14/15 0724  TROPONINI <0.03   ------------------------------------------------------------------------------------------------------------------  RADIOLOGY: Dg Chest Portable 1 View  Result Date: 10/14/2015 CLINICAL DATA:  Onset of right-sided numbness and slurred speech this morning. EXAM: PORTABLE CHEST 1 VIEW COMPARISON:  Report of a chest x-ray dated February 13, 2015 FINDINGS: The lungs are well-expanded. There is scarring or subsegmental atelectasis lateral to the cardiac apex. The  heart and pulmonary vascularity are normal. The ICD is in reasonable position. There is calcification in the wall of the aortic arch. There is no pleural effusion. IMPRESSION: Scarring at the left lung base which by report is stable. No CHF, pneumonia, nor other acute cardiopulmonary abnormality. Aortic atherosclerosis. Electronically Signed   By: David  Martinique M.D.   On: 10/14/2015 08:08   Ct Head Code Stroke W/o Cm  Result Date: 10/14/2015 CLINICAL DATA:  Code stroke. Patient awoke with RIGHT-sided numbness and slurred speech, time of onset therefore unknown. EXAM: CT HEAD WITHOUT CONTRAST TECHNIQUE: Contiguous axial images were obtained from the base of the skull through the vertex without intravenous contrast. COMPARISON:  None. FINDINGS: Brain: No evidence of acute infarction, hemorrhage, hydrocephalus, or mass lesion/mass effect. Mild atrophy, not unexpected for age. Hypoattenuation of white matter, likely small vessel disease. Slight prominence of the extra-axial CSF spaces over the parietal convexity, likely represent cerebral volume loss, although small hygromas could have a similar appearance. Vascular: No hyperdense vessel. Mild vascular calcification in the carotid siphons, normal for age. Skull: Normal. Negative for fracture or focal lesion. Sinuses/Orbits: No acute finding.  BILATERAL cataract extraction. Other: None. ASPECTS [facility] Stroke Program Early CT Score) - Ganglionic level infarction (caudate, lentiform nuclei, internal capsule, insula, M1-M3 cortex): 7 - Supraganglionic infarction (M4-M6 cortex): 3 Total score (0-10 with 10 being normal): 10 IMPRESSION: 1. No evidence for acute stroke or hemorrhage. Atrophy and small vessel disease. Slight prominence of the extra-axial CSF spaces likely reflects cerebral volume loss. 2. ASPECTS is 10. These results were called by  telephone at the time of interpretation on 10/14/2015 at 7:40 am to Dr. Hinda Kehr , who verbally acknowledged these  results. Electronically Signed   By: Staci Righter M.D.   On: 10/14/2015 07:42    EKG: Orders placed or performed during the hospital encounter of 10/14/15  . EKG 12-Lead  . EKG 12-Lead  Atrioventricular dual paced rhythm at 60 bpm, nonspecific changes  IMPRESSION AND PLAN:  Principal Problem:   CVA (cerebral vascular accident) ([place]) Active Problems:   Numbness   Essential hypertension   Chronic renal insufficiency #1 acute stroke. Admit patient to medical floor, initiate him on aspirin, Lipitor, get lipid panel, get echocardiogram, CT angiogram of head and neck, neurology consultation, will need to repeat CT of the head without contrast in 24 hours, unable to get MRI done due to permanent pacemaker. Get TSH, hemoglobin A1c, getting physical therapist, occupational therapist and speech therapist involved for recommendations #2. Dizziness, get physical therapist involved #3. Chronic renal insufficiency, COPD, stage III,  follow after CT angiogram, IV fluids #4. Essential hypertension, maintain blood pressure readings around 140s to 160s #5. Diabetes mellitus, get hemoglobin A1c, lipid profile, continue outpatient medications and sliding scale insulin All the records are reviewed and case discussed with ED provider. Management plans discussed with the patient, family and they are in agreement.  CODE STATUS: Code Status History    This patient does not have a recorded code status. Please follow your organizational policy for patients in this situation.       TOTAL TIME TAKING CARE OF THIS PATIENT: 50 minutes.    Theodoro Grist M.D on 10/14/2015 at 10:12 AM  Between 7am to 6pm - Pager - [phone] After 6pm go to [email or web address] - password [email or web address] [place] [facility]  Office  [phone]  CC: Primary care physician; Rusty Aus, MD

## 2015-10-14 NOTE — Progress Notes (Signed)
SLP Cancellation Note  Patient Details Name: Thomas Morton MRN: [id number] DOB: Feb 17, 1927   Cancelled treatment:       Reason Eval/Treat Not Completed: SLP screened, no needs identified, will sign off. Pt denied any trouble swallowing (noted ate his full lunch meal). Pt denied any speech-language deficits conversing w/ SLP at conversational level.  Will f/u w/ pt's status while admitted for any change in status. NSG updated. Pt/NSG agreed.   Orinda Kenner, MS, CCC-SLP  Thomas Morton 10/14/2015, 2:02 PM

## 2015-10-14 NOTE — Progress Notes (Signed)
   10/14/15 0700  Clinical Encounter Type  Visited With Health care provider  Visit Type Initial;Psychological support;Spiritual support;Social support;Code;ED  Referral From Care management  Consult/Referral To Chaplain  Stress Factors  Patient Stress Factors None identified  Family Stress Factors None identified    Chaplain responded to a code stroke @7 :29 am. Patient went to CT and then the ED. Chaplain will follow up.

## 2015-10-15 ENCOUNTER — Inpatient Hospital Stay: Payer: Commercial Managed Care - HMO

## 2015-10-15 ENCOUNTER — Inpatient Hospital Stay
Admit: 2015-10-15 | Discharge: 2015-10-15 | Disposition: A | Discharge status: Home / Self Care | Payer: Commercial Managed Care - HMO | Attending: Internal Medicine | Admitting: Internal Medicine

## 2015-10-15 DIAGNOSIS — R42 Dizziness and giddiness: Secondary | ICD-10-CM

## 2015-10-15 DIAGNOSIS — Z23 Encounter for immunization: Secondary | ICD-10-CM | POA: Diagnosis not present

## 2015-10-15 LAB — CBC
HEMATOCRIT: 44.1 % (ref 40.0–52.0)
HEMOGLOBIN: 15 g/dL (ref 13.0–18.0)
MCH: 34.3 pg — ABNORMAL HIGH (ref 26.0–34.0)
MCHC: 34 g/dL (ref 32.0–36.0)
MCV: 100.6 fL — AB (ref 80.0–100.0)
Platelets: 156 10*3/uL (ref 150–440)
RBC: 4.38 MIL/uL — AB (ref 4.40–5.90)
RDW: 13.9 % (ref 11.5–14.5)
WBC: 5.7 10*3/uL (ref 3.8–10.6)

## 2015-10-15 LAB — ECHOCARDIOGRAM COMPLETE
HEIGHTINCHES: 70 in
Weight: 3012.8 oz

## 2015-10-15 LAB — HEMOGLOBIN A1C
Hgb A1c MFr Bld: 6.1 % — ABNORMAL HIGH (ref 4.8–5.6)
MEAN PLASMA GLUCOSE: 128 mg/dL

## 2015-10-15 LAB — BASIC METABOLIC PANEL
ANION GAP: 5 (ref 5–15)
BUN: 22 mg/dL — ABNORMAL HIGH (ref 6–20)
CHLORIDE: 100 mmol/L — AB (ref 101–111)
CO2: 34 mmol/L — ABNORMAL HIGH (ref 22–32)
Calcium: 9.3 mg/dL (ref 8.9–10.3)
Creatinine, Ser: 1.33 mg/dL — ABNORMAL HIGH (ref 0.61–1.24)
GFR calc Af Amer: 55 mL/min — ABNORMAL LOW (ref >60)
GFR, EST NON AFRICAN AMERICAN: 47 mL/min — AB (ref >60)
Glucose, Bld: 100 mg/dL — ABNORMAL HIGH (ref 65–99)
POTASSIUM: 4.1 mmol/L (ref 3.5–5.1)
SODIUM: 139 mmol/L (ref 135–145)

## 2015-10-15 LAB — TSH: TSH: 1.749 u[IU]/mL (ref 0.350–4.500)

## 2015-10-15 LAB — GLUCOSE, CAPILLARY
GLUCOSE-CAPILLARY: 123 mg/dL — AB (ref 65–99)
GLUCOSE-CAPILLARY: 104 mg/dL — AB (ref 65–99)

## 2015-10-15 MED ORDER — ASPIRIN 325 MG PO TBEC: 325.0000 mg | DELAYED_RELEASE_TABLET | Freq: Every day | ORAL | 0 refills | Status: DC

## 2015-10-15 MED ORDER — ATORVASTATIN CALCIUM 40 MG PO TABS
40.0000 mg | ORAL_TABLET | Freq: Every day | ORAL | 6 refills | Status: DC
Start: 2015-10-15 — End: 2020-03-14

## 2015-10-15 MED ORDER — ACETAMINOPHEN 325 MG PO TABS
650.0000 mg | ORAL_TABLET | Freq: Four times a day (QID) | ORAL | Status: DC | PRN
  Administered 2015-10-15: 650 mg via ORAL
  Filled 2015-10-15: qty 2

## 2015-10-15 NOTE — Evaluation (Signed)
Physical Therapy Evaluation Patient Details Name: Thomas Morton MRN: [id number] DOB: 1927/02/11 Today's Date: 10/15/2015   History of Present Illness  Pt admitted for possible CVA. Unable to perform MRI at this time secondary to pacemaker. Pt with history of HTN, pacemaker, CAD, and DM.   Clinical Impression  Pt is a pleasant 82 year old male who was admitted for possible CVA. Pt appears to be at baseline level at this time. Pt demonstrates all bed mobility/transfers/ambulation with independence and no AD. Coordination/strength/sensation WNL. Pt does not require any further PT needs at this time. Pt will be dc in house and does not require follow up. RN aware. Will dc current orders.      Follow Up Recommendations No PT follow up    Equipment Recommendations  None recommended by PT    Recommendations for Other Services       Precautions / Restrictions Precautions Precautions: None Restrictions Weight Bearing Restrictions: No      Mobility  Bed Mobility Overal bed mobility: Independent             General bed mobility comments: safe technique performed  Transfers Overall transfer level: Independent Equipment used: None             General transfer comment: safe technique performed with no AD. No unsteadiness noted  Ambulation/Gait Ambulation/Gait assistance: Independent Ambulation Distance (Feet): 150 Feet Assistive device: None Gait Pattern/deviations: Step-through pattern     General Gait Details: ambulated using reciprocal gait pattern and no AD. Safe technique performed and pt able to carry conversation and ambulate at same time. No unsteadiness noted  Stairs            Wheelchair Mobility    Modified Rankin (Stroke Patients Only)       Balance Overall balance assessment: Independent                                           Pertinent Vitals/Pain Pain Assessment: No/denies pain    Home Living Family/patient  expects to be discharged to:: Private residence Living Arrangements: Spouse/significant other Available Help at Discharge: Family Type of Home: House Home Access: Stairs to enter Entrance Stairs-Rails: Can reach both Entrance Stairs-Number of Steps: 4 Home Layout: One level Home Equipment: None      Prior Function Level of Independence: Independent               Hand Dominance        Extremity/Trunk Assessment   Upper Extremity Assessment: Overall WFL for tasks assessed           Lower Extremity Assessment: Overall WFL for tasks assessed         Communication   Communication: No difficulties  Cognition Arousal/Alertness: Awake/alert Behavior During Therapy: WFL for tasks assessed/performed Overall Cognitive Status: Within Functional Limits for tasks assessed                      General Comments      Exercises     Assessment/Plan    PT Assessment Patent does not need any further PT services  PT Problem List            PT Treatment Interventions      PT Goals (Current goals can be found in the Care Plan section)  Acute Rehab PT Goals Patient Stated Goal: to get stronger PT  Goal Formulation: All assessment and education complete, DC therapy Time For Goal Achievement: Nov 14, 2015 Potential to Achieve Goals: Good    Frequency     Barriers to discharge        Co-evaluation               End of Session Equipment Utilized During Treatment: Gait belt Activity Tolerance: Patient tolerated treatment well Patient left: in bed Nurse Communication: Mobility status         Time: [id number] PT Time Calculation (min) (ACUTE ONLY): 12 min   Charges:   PT Evaluation $PT Eval Low Complexity: 1 Procedure     PT G Codes:        Veralyn Lopp 2015/11/14, 1:50 PM  Greggory Stallion, PT, DPT [phone]

## 2015-10-15 NOTE — Progress Notes (Signed)
OT Cancellation Note  Patient Details Name: TERMAINE FORTUNO MRN: [id number] DOB: 06-13-1927   Cancelled Treatment:    Reason Eval/Treat Not Completed: Patient declined, no reason specified.  Spoke to PT, [employer], and patient is at baseline and declined OT evaluation.  Please re-consult if any further needs arise.  Thank you for the referral.  Chrys Racer, OTR/L ascom [phone] 10/15/15, 10:44 AM

## 2015-10-15 NOTE — Care Management Important Message (Signed)
Important Message  Patient Details  Name: Thomas Morton MRN: [id number] Date of Birth: [birth year]   Medicare Important Message Given:  Yes    Shelbie Ammons, RN 10/15/2015, 12:05 PM

## 2015-10-15 NOTE — Discharge Summary (Signed)
[facility] NAME: Thomas Morton    MR#:  [id number]  DATE OF BIRTH:  [birth year]  DATE OF ADMISSION:  10/14/2015 ADMITTING PHYSICIAN: Theodoro Grist, MD  DATE OF DISCHARGE: No discharge date for patient encounter.  PRIMARY CARE PHYSICIAN: Rusty Aus, MD     ADMISSION DIAGNOSIS:  Right sided weakness [R53.1] Cerebrovascular accident (CVA), unspecified mechanism ([place]) [I63.9]  DISCHARGE DIAGNOSIS:  Principal Problem:   CVA (cerebral vascular accident) ([place]) Active Problems:   Numbness   Essential hypertension   Dizziness and giddiness   Chronic renal insufficiency   SECONDARY DIAGNOSIS:   Past Medical History:  Diagnosis Date  . Cancer [facility])    prostate 2001  . CHF (congestive heart failure) ([place])   . Diabetes mellitus without complication ([place])     .pro HOSPITAL COURSE:   Thomas Morton  is a 82 y.o. male with a known history of former tobacco abuse for 30 years, essential hypertension, permanent pacemaker placement, coronary artery disease, diabetes, who presents to the hospital with complaints of right-sided numbness and weakness. Apparently, patient woke up at around 3:30 at night and noted that his right side of the face, right arm and leg are numb and weak, he was dragging his right lower extremity to walk to the bathroom. He has been not feeling well for the past 1-2 days, feeling lightheaded and dizzy, unsteady on his feet. During admission he denied any speech problems, however, patient's son felt the patient was slurring speech. He denied any swallowing, vision abnormalities.  Noncontrast CT of head was unremarkable. CT angiogram of head and neck was also unremarkable. Patient was seen by neurologist, who felt that patient had a stroke, recommended to repeat CT of head in 24 hours after admission, since MRI was not possible to do due to permanent pacemaker. Repeated head CT showed questionable left parietal  vertex density which may represent small cortical infarction. Patient's numbness and weakness improved, close to resolved in about 24 hours after he was admitted to the hospital  . Echocardiogram revealed global hypokinesis, normal valves, ejection fraction of 40%. Patient was seen by occupational, speech , physical therapist and recommended no follow-up.   Discussion by problem:  #1 acute stroke. Continue aspirin, Lipitor, lipid panel revealed total cholesterol 157, triglycerides 148, HDL 39, LDL 88, echocardiogram showed ejection fraction of 40%, global hypokinesis, normal valves. CT angiogram of head and neck was unremarkable, neurologist saw patient in consultation, recommended to continue Lipitor and aspirin indefinitely, repeated CT of the head without contrast revealed possible left parietal vertex cortical stroke, unable to get MRI done due to permanent pacemaker. TSH was pending, hemoglobin A1c 6.1, physical , occupational and speech therapists were involved , recommended no follow-up #2. Dizziness, improved with IV fluid administration, patient may benefit from decreasing diuretic doses as outpatient #3. Chronic renal insufficiency, COPD, stage III, stable on IV fluids, even after CT angiogram, it is recommended to follow creatinine level as outpatient #4. Essential hypertension, continue Coreg, good control, patient may benefit from ACE inhibitor addition, due to cardiomyopathy, recommended to be initiated in about 1 week after discharge #5. Diabetes mellitus, hemoglobin A1c 6.1, lipid profile as above, continue outpatient medications #6. Cardiomyopathy with ejection fraction of 40%, patient would benefit from ACE inhibitor addition, he is to continue Coreg, recommendation to follow with primary cardiologist was made and discussed this patient  DISCHARGE CONDITIONS:   Stable  CONSULTS OBTAINED:  Treatment Team:  Magda Paganini  Doy Mince, MD  DRUG ALLERGIES:   Allergies  Allergen Reactions  .  Sulfa Antibiotics Rash    DISCHARGE MEDICATIONS:   Current Discharge Medication List    START taking these medications   Details  aspirin EC 325 MG EC tablet Take 1 tablet (325 mg total) by mouth daily. Qty: 30 tablet, Refills: 0    atorvastatin (LIPITOR) 40 MG tablet Take 1 tablet (40 mg total) by mouth daily at 6 PM. Qty: 30 tablet, Refills: 6      CONTINUE these medications which have NOT CHANGED   Details  allopurinol (ZYLOPRIM) 300 MG tablet Take 1 tablet by mouth at bedtime.    carvedilol (COREG) 3.125 MG tablet Take 1 tablet by mouth at bedtime.    etodolac (LODINE) 400 MG tablet Take 1 tablet by mouth daily.    glimepiride (AMARYL) 2 MG tablet Take 1 tablet by mouth at bedtime.    indapamide (LOZOL) 1.25 MG tablet 1 tablet daily.    metFORMIN (GLUCOPHAGE) 1000 MG tablet Take 1 tablet by mouth at bedtime.    pantoprazole (PROTONIX) 40 MG tablet Take 1 tablet by mouth as needed.    triamcinolone (KENALOG) 0.025 % cream Apply 1 application topically as needed.         DISCHARGE INSTRUCTIONS:    Patient is to follow-up with primary care physician, primary cardiologist within 1 week after discharge  If you experience worsening of your admission symptoms, develop shortness of breath, life threatening emergency, suicidal or homicidal thoughts you must seek medical attention immediately by calling 911 or calling your MD immediately  if symptoms less severe.  You Must read complete instructions/literature along with all the possible adverse reactions/side effects for all the Medicines you take and that have been prescribed to you. Take any new Medicines after you have completely understood and accept all the possible adverse reactions/side effects.   Please note  You were cared for by a hospitalist during your hospital stay. If you have any questions about your discharge medications or the care you received while you were in the hospital after you are discharged, you  can call the unit and asked to speak with the hospitalist on call if the hospitalist that took care of you is not available. Once you are discharged, your primary care physician will handle any further medical issues. Please note that NO REFILLS for any discharge medications will be authorized once you are discharged, as it is imperative that you return to your primary care physician (or establish a relationship with a primary care physician if you do not have one) for your aftercare needs so that they can reassess your need for medications and monitor your lab values.    Today   CHIEF COMPLAINT:   Chief Complaint  Patient presents with  . Code Stroke    HISTORY OF PRESENT ILLNESS:  Thomas Morton  is a 82 y.o. male with a known history of former tobacco abuse for 30 years, essential hypertension, permanent pacemaker placement, coronary artery disease, diabetes, who presents to the hospital with complaints of right-sided numbness and weakness. Apparently, patient woke up at around 3:30 at night and noted that his right side of the face, right arm and leg are numb and weak, he was dragging his right lower extremity to walk to the bathroom. He has been not feeling well for the past 1-2 days, feeling lightheaded and dizzy, unsteady on his feet. During admission he denied any speech problems, however, patient's son felt the  patient was slurring speech. He denied any swallowing, vision abnormalities.  Noncontrast CT of head was unremarkable. CT angiogram of head and neck was also unremarkable. Patient was seen by neurologist, who felt that patient had a stroke, recommended to repeat CT of head in 24 hours after admission, since MRI was not possible to do due to permanent pacemaker. Repeated head CT showed questionable left parietal vertex density which may represent small cortical infarction. Patient's numbness and weakness improved, close to resolved in about 24 hours after he was admitted to the hospital   . Echocardiogram revealed global hypokinesis, normal valves, ejection fraction of 40%. Patient was seen by occupational, speech , physical therapist and recommended no follow-up.   Discussion by problem:  #1 acute stroke. Continue aspirin, Lipitor, lipid panel revealed total cholesterol 157, triglycerides 148, HDL 39, LDL 88, echocardiogram showed ejection fraction of 40%, global hypokinesis, normal valves. CT angiogram of head and neck was unremarkable, neurologist saw patient in consultation, recommended to continue Lipitor and aspirin indefinitely, repeated CT of the head without contrast revealed possible left parietal vertex cortical stroke, unable to get MRI done due to permanent pacemaker. TSH was pending, hemoglobin A1c 6.1, physical , occupational and speech therapists were involved , recommended no follow-up #2. Dizziness, improved with IV fluid administration, patient may benefit from decreasing diuretic doses as outpatient #3. Chronic renal insufficiency, COPD, stage III, stable on IV fluids, even after CT angiogram, it is recommended to follow creatinine level as outpatient #4. Essential hypertension, continue Coreg, good control, patient may benefit from ACE inhibitor addition, due to cardiomyopathy, recommended to be initiated in about 1 week after discharge #5. Diabetes mellitus, hemoglobin A1c 6.1, lipid profile as above, continue outpatient medications #6. Cardiomyopathy with ejection fraction of 40%, patient would benefit from ACE inhibitor addition, he is to continue Coreg, recommendation to follow with primary cardiologist was made and discussed this patient    VITAL SIGNS:  Blood pressure 120/65, pulse 76, temperature 97.6 F (36.4 C), temperature source Oral, resp. rate 16, height 5\' 10"  (1.778 m), weight 85.4 kg (188 lb 4.8 oz), SpO2 95 %.  I/O:   Intake/Output Summary (Last 24 hours) at 10/15/15 1306 Last data filed at 10/15/15 0800  Gross per 24 hour  Intake            823.33 ml  Output             1100 ml  Net          -276.67 ml    PHYSICAL EXAMINATION:  GENERAL:  82 y.o.-year-old patient lying in the bed with no acute distress.  EYES: Pupils equal, round, reactive to light and accommodation. No scleral icterus. Extraocular muscles intact.  HEENT: Head atraumatic, normocephalic. Oropharynx and nasopharynx clear.  NECK:  Supple, no jugular venous distention. No thyroid enlargement, no tenderness.  LUNGS: Normal breath sounds bilaterally, no wheezing, rales,rhonchi or crepitation. No use of accessory muscles of respiration.  CARDIOVASCULAR: S1, S2 normal. No murmurs, rubs, or gallops.  ABDOMEN: Soft, non-tender, non-distended. Bowel sounds present. No organomegaly or mass.  EXTREMITIES: No pedal edema, cyanosis, or clubbing.  NEUROLOGIC: Cranial nerves II through XII are intact. Muscle strength 5/5 in all extremities. Sensation intact. Gait not checked.  PSYCHIATRIC: The patient is alert and oriented x 3.  SKIN: No obvious rash, lesion, or ulcer.   DATA REVIEW:   CBC  Recent Labs Lab 10/15/15 0336  WBC 5.7  HGB 15.0  HCT 44.1  PLT 156  Chemistries   Recent Labs Lab 10/14/15 0724 10/15/15 0336  NA 136 139  K 4.1 4.1  CL 98* 100*  CO2 32 34*  GLUCOSE 137* 100*  BUN 22* 22*  CREATININE 1.25* 1.33*  CALCIUM 9.1 9.3  AST 22  --   ALT 21  --   ALKPHOS 49  --   BILITOT 0.5  --     Cardiac Enzymes  Recent Labs Lab 10/14/15 0724  TROPONINI <0.03    Microbiology Results  No results found for this or any previous visit.  RADIOLOGY:  Ct Angio Head W Or Wo Contrast  Result Date: 10/14/2015 CLINICAL DATA:  82 year old male code stroke, right side weakness numbness and slurred speech. Unknown time of ictus. Initial encounter. EXAM: CT ANGIOGRAPHY HEAD AND NECK TECHNIQUE: Multidetector CT imaging of the head and neck was performed using the standard protocol during bolus administration of intravenous contrast. Multiplanar CT image  reconstructions and MIPs were obtained to evaluate the vascular anatomy. Carotid stenosis measurements (when applicable) are obtained utilizing NASCET criteria, using the distal internal carotid diameter as the denominator. CONTRAST:  75 mL Isovue 370 COMPARISON:  Head CT without contrast 0730 hours today. FINDINGS: CTA NECK Skeleton: No acute osseous abnormality identified. Visualized paranasal sinuses and mastoids are stable and well pneumatized. Upper chest: Left chest cardiac pacemaker or AICD partially visible. Widespread pulmonary subpleural reticular opacity suggestive of chronic lung disease. Central airways are patent. No superior mediastinal lymphadenopathy. Other neck: Negative thyroid, larynx, pharynx, parapharyngeal spaces, retropharyngeal space, sublingual space, submandibular glands and parotid glands. Postoperative changes to the globes. No cervical lymphadenopathy. Aortic arch: 3 vessel arch configuration. Mild calcified arch atherosclerosis. Right carotid system: No brachiocephalic artery or right CCA origin stenosis. Soft and calcified plaque at the right carotid bifurcation and involving the right ICA origin and bulb. Subsequent stenosis is less than 50 % with respect to the distal vessel, because there is a somewhat diminutive caliber of the cervical right ICA throughout its course, never larger than about 4 mm diameter. Severe tortuosity of the vessel just below the skullbase, but no stenosis. Left carotid system: No left CCA origin stenosis despite calcified plaque. Mild soft plaque in the left CCA proximal to the bifurcation. At the bifurcation there is soft and calcified plaque involving the left ICA origin and lateral bulb, but no subsequent stenosis. Negative cervical left ICA otherwise aside from tortuosity just below the skullbase. The left ICA has a more normal caliber measuring about 5 mm diameter. Vertebral arteries:No proximal right subclavian artery stenosis. Soft plaque at the  right vertebral artery origin without stenosis. Negative right vertebral artery to the skullbase. No proximal left subclavian artery stenosis despite soft and calcified plaque. Normal left vertebral artery origin. Mildly tortuous V1 segment which is also partially obscured by dense paravertebral venous contrast reflux. Otherwise negative left vertebral artery to the skullbase. CTA HEAD Posterior circulation: Codominant distal vertebral arteries are normal to the vertebrobasilar junction. Both PICA origins are patent. No basilar artery stenosis. Mild basilar artery irregularity. Normal SCA and PCA origins. However, there is right distal P1 and P2 segment irregularity and mild stenosis best seen on series 10, image 26. Bilateral PCA branches otherwise are normal. Anterior circulation: Both ICA siphons are patent. Moderate left and mild right ICA siphon calcified plaque without stenosis. The right siphon appears non dominant and somewhat diminutive compared to the left. Normal ophthalmic artery origins. Posterior communicating arteries are diminutive or absent. Patent carotid termini. The right ACA A1  segment is diminutive or absent. The right A2 is primarily supplied from the left. Normal MCA and left ACA origins. Anterior communicating artery and bilateral ACA branches are within normal limits. Left MCA M1 segment is tortuous but otherwise normal. Left MCA bifurcation is patent. There is a simple fide left MCA branching pattern, left MCA branches are within normal limits. Right MCA M1 segment is tortuous but otherwise normal. Right MCA branches are normal. Venous sinuses: Patent. Anatomic variants: Diminutive or absent right ACA A1 segment. This probably in part explains the non dominant, diminutive appearance of the right ICA. Delayed phase: Stable gray-white matter differentiation throughout the brain. No acute or evolving cortically based infarct identified. No abnormal enhancement identified. Review of the MIP  images confirms the above findings IMPRESSION: 1. Negative for emergent large vessel occlusion. 2. Right greater than left carotid bifurcation and proximal ICA atherosclerosis is not hemodynamically significant. Non dominant, diminutive appearing right ICA. ICA siphon calcified plaque without stenosis. 3. Negative posterior circulation except for proximal right PCA irregularity and mild stenosis. 4.  Stable CT appearance of the brain. Electronically Signed   By: Genevie Ann M.D.   On: 10/14/2015 12:55   Ct Head Wo Contrast  Result Date: 10/15/2015 CLINICAL DATA:  Facial droop and right sided numbness beginning yesterday. Patient with diabetes. EXAM: CT HEAD WITHOUT CONTRAST TECHNIQUE: Contiguous axial images were obtained from the base of the skull through the vertex without intravenous contrast. COMPARISON:  CT angiography and CT 10/14/2015 FINDINGS: Brain: Mild age related atrophy. Since yesterday's study, on image 23 a, 1 could question a a 1 cm cortical infarction at the left parietal vertex in the region of the central sulcus. This is not definite. No other questionably acute insult. Mild chronic small-vessel ischemic changes elsewhere within the cerebral hemispheric white matter. No mass lesion, hemorrhage, hydrocephalus or extra-axial collection. Vascular: There is atherosclerotic calcification of the major vessels at the base of the brain. Skull: Normal Sinuses/Orbits: Normal Other: None IMPRESSION: No definite acute finding. On image 23 at the left parietal vertex, one could question a 1 cm region of cortical low density that could possibly represent a small cortical infarction. No hemorrhage or mass effect. Electronically Signed   By: Nelson Chimes M.D.   On: 10/15/2015 09:05   Ct Angio Neck W Or Wo Contrast  Result Date: 10/14/2015 CLINICAL DATA:  82 year old male code stroke, right side weakness numbness and slurred speech. Unknown time of ictus. Initial encounter. EXAM: CT ANGIOGRAPHY HEAD AND NECK  TECHNIQUE: Multidetector CT imaging of the head and neck was performed using the standard protocol during bolus administration of intravenous contrast. Multiplanar CT image reconstructions and MIPs were obtained to evaluate the vascular anatomy. Carotid stenosis measurements (when applicable) are obtained utilizing NASCET criteria, using the distal internal carotid diameter as the denominator. CONTRAST:  75 mL Isovue 370 COMPARISON:  Head CT without contrast 0730 hours today. FINDINGS: CTA NECK Skeleton: No acute osseous abnormality identified. Visualized paranasal sinuses and mastoids are stable and well pneumatized. Upper chest: Left chest cardiac pacemaker or AICD partially visible. Widespread pulmonary subpleural reticular opacity suggestive of chronic lung disease. Central airways are patent. No superior mediastinal lymphadenopathy. Other neck: Negative thyroid, larynx, pharynx, parapharyngeal spaces, retropharyngeal space, sublingual space, submandibular glands and parotid glands. Postoperative changes to the globes. No cervical lymphadenopathy. Aortic arch: 3 vessel arch configuration. Mild calcified arch atherosclerosis. Right carotid system: No brachiocephalic artery or right CCA origin stenosis. Soft and calcified plaque at the right carotid  bifurcation and involving the right ICA origin and bulb. Subsequent stenosis is less than 50 % with respect to the distal vessel, because there is a somewhat diminutive caliber of the cervical right ICA throughout its course, never larger than about 4 mm diameter. Severe tortuosity of the vessel just below the skullbase, but no stenosis. Left carotid system: No left CCA origin stenosis despite calcified plaque. Mild soft plaque in the left CCA proximal to the bifurcation. At the bifurcation there is soft and calcified plaque involving the left ICA origin and lateral bulb, but no subsequent stenosis. Negative cervical left ICA otherwise aside from tortuosity just below  the skullbase. The left ICA has a more normal caliber measuring about 5 mm diameter. Vertebral arteries:No proximal right subclavian artery stenosis. Soft plaque at the right vertebral artery origin without stenosis. Negative right vertebral artery to the skullbase. No proximal left subclavian artery stenosis despite soft and calcified plaque. Normal left vertebral artery origin. Mildly tortuous V1 segment which is also partially obscured by dense paravertebral venous contrast reflux. Otherwise negative left vertebral artery to the skullbase. CTA HEAD Posterior circulation: Codominant distal vertebral arteries are normal to the vertebrobasilar junction. Both PICA origins are patent. No basilar artery stenosis. Mild basilar artery irregularity. Normal SCA and PCA origins. However, there is right distal P1 and P2 segment irregularity and mild stenosis best seen on series 10, image 26. Bilateral PCA branches otherwise are normal. Anterior circulation: Both ICA siphons are patent. Moderate left and mild right ICA siphon calcified plaque without stenosis. The right siphon appears non dominant and somewhat diminutive compared to the left. Normal ophthalmic artery origins. Posterior communicating arteries are diminutive or absent. Patent carotid termini. The right ACA A1 segment is diminutive or absent. The right A2 is primarily supplied from the left. Normal MCA and left ACA origins. Anterior communicating artery and bilateral ACA branches are within normal limits. Left MCA M1 segment is tortuous but otherwise normal. Left MCA bifurcation is patent. There is a simple fide left MCA branching pattern, left MCA branches are within normal limits. Right MCA M1 segment is tortuous but otherwise normal. Right MCA branches are normal. Venous sinuses: Patent. Anatomic variants: Diminutive or absent right ACA A1 segment. This probably in part explains the non dominant, diminutive appearance of the right ICA. Delayed phase: Stable  gray-white matter differentiation throughout the brain. No acute or evolving cortically based infarct identified. No abnormal enhancement identified. Review of the MIP images confirms the above findings IMPRESSION: 1. Negative for emergent large vessel occlusion. 2. Right greater than left carotid bifurcation and proximal ICA atherosclerosis is not hemodynamically significant. Non dominant, diminutive appearing right ICA. ICA siphon calcified plaque without stenosis. 3. Negative posterior circulation except for proximal right PCA irregularity and mild stenosis. 4.  Stable CT appearance of the brain. Electronically Signed   By: Genevie Ann M.D.   On: 10/14/2015 12:55   Dg Chest Portable 1 View  Result Date: 10/14/2015 CLINICAL DATA:  Onset of right-sided numbness and slurred speech this morning. EXAM: PORTABLE CHEST 1 VIEW COMPARISON:  Report of a chest x-ray dated February 13, 2015 FINDINGS: The lungs are well-expanded. There is scarring or subsegmental atelectasis lateral to the cardiac apex. The heart and pulmonary vascularity are normal. The ICD is in reasonable position. There is calcification in the wall of the aortic arch. There is no pleural effusion. IMPRESSION: Scarring at the left lung base which by report is stable. No CHF, pneumonia, nor other acute cardiopulmonary abnormality.  Aortic atherosclerosis. Electronically Signed   By: David  Martinique M.D.   On: 10/14/2015 08:08   Ct Head Code Stroke W/o Cm  Result Date: 10/14/2015 CLINICAL DATA:  Code stroke. Patient awoke with RIGHT-sided numbness and slurred speech, time of onset therefore unknown. EXAM: CT HEAD WITHOUT CONTRAST TECHNIQUE: Contiguous axial images were obtained from the base of the skull through the vertex without intravenous contrast. COMPARISON:  None. FINDINGS: Brain: No evidence of acute infarction, hemorrhage, hydrocephalus, or mass lesion/mass effect. Mild atrophy, not unexpected for age. Hypoattenuation of white matter, likely small  vessel disease. Slight prominence of the extra-axial CSF spaces over the parietal convexity, likely represent cerebral volume loss, although small hygromas could have a similar appearance. Vascular: No hyperdense vessel. Mild vascular calcification in the carotid siphons, normal for age. Skull: Normal. Negative for fracture or focal lesion. Sinuses/Orbits: No acute finding.  BILATERAL cataract extraction. Other: None. ASPECTS [facility] Stroke Program Early CT Score) - Ganglionic level infarction (caudate, lentiform nuclei, internal capsule, insula, M1-M3 cortex): 7 - Supraganglionic infarction (M4-M6 cortex): 3 Total score (0-10 with 10 being normal): 10 IMPRESSION: 1. No evidence for acute stroke or hemorrhage. Atrophy and small vessel disease. Slight prominence of the extra-axial CSF spaces likely reflects cerebral volume loss. 2. ASPECTS is 10. These results were called by telephone at the time of interpretation on 10/14/2015 at 7:40 am to Dr. Hinda Kehr , who verbally acknowledged these results. Electronically Signed   By: Staci Righter M.D.   On: 10/14/2015 07:42    EKG:   Orders placed or performed during the hospital encounter of 10/14/15  . EKG 12-Lead  . EKG 12-Lead      Management plans discussed with the patient, family and they are in agreement.  CODE STATUS:     Code Status Orders        Start     Ordered   10/14/15 1056  Full code  Continuous     10/14/15 1055    Code Status History    Date Active Date Inactive Code Status Order ID Comments User Context   This patient has a current code status but no historical code status.    Advance Directive Documentation   Flowsheet Row Most Recent Value  Type of Advance Directive  Healthcare Power of Attorney, Living will  Pre-existing out of facility DNR order (yellow form or pink MOST form)  No data  "MOST" Form in Place?  No data      TOTAL TIME TAKING CARE OF THIS PATIENT: 40  minutes.    Theodoro Grist M.D on 10/15/2015  at 1:06 PM  Between 7am to 6pm - Pager - [phone]  After 6pm go to [email or web address] - password [email or web address] [facility]  Office  [phone]  CC: Primary care physician; Rusty Aus, MD

## 2015-10-15 NOTE — Progress Notes (Signed)
Pt being discharged home, discharge instructions reviewed with pt and son, states understanding, pt with no noted complaints, no distress or discomfort noted

## 2015-10-15 NOTE — Progress Notes (Signed)
*  PRELIMINARY RESULTS* Echocardiogram 2D Echocardiogram has been performed.  Sherrie Sport 10/15/2015, 8:35 AM

## 2015-10-20 DIAGNOSIS — I700 Atherosclerosis of aorta: Secondary | ICD-10-CM | POA: Insufficient documentation

## 2015-10-20 DIAGNOSIS — Z8673 Personal history of transient ischemic attack (TIA), and cerebral infarction without residual deficits: Secondary | ICD-10-CM | POA: Insufficient documentation

## 2015-10-22 DIAGNOSIS — I420 Dilated cardiomyopathy: Secondary | ICD-10-CM | POA: Diagnosis not present

## 2015-10-24 DIAGNOSIS — I700 Atherosclerosis of aorta: Secondary | ICD-10-CM | POA: Diagnosis not present

## 2015-10-24 DIAGNOSIS — I639 Cerebral infarction, unspecified: Secondary | ICD-10-CM | POA: Diagnosis not present

## 2015-10-24 DIAGNOSIS — I6523 Occlusion and stenosis of bilateral carotid arteries: Secondary | ICD-10-CM | POA: Diagnosis not present

## 2016-01-21 DIAGNOSIS — I420 Dilated cardiomyopathy: Secondary | ICD-10-CM | POA: Diagnosis not present

## 2016-02-07 DIAGNOSIS — Z125 Encounter for screening for malignant neoplasm of prostate: Secondary | ICD-10-CM | POA: Diagnosis not present

## 2016-02-07 DIAGNOSIS — E1142 Type 2 diabetes mellitus with diabetic polyneuropathy: Secondary | ICD-10-CM | POA: Diagnosis not present

## 2016-02-07 DIAGNOSIS — E538 Deficiency of other specified B group vitamins: Secondary | ICD-10-CM | POA: Diagnosis not present

## 2016-02-14 DIAGNOSIS — I5022 Chronic systolic (congestive) heart failure: Secondary | ICD-10-CM | POA: Diagnosis not present

## 2016-02-14 DIAGNOSIS — I700 Atherosclerosis of aorta: Secondary | ICD-10-CM | POA: Diagnosis not present

## 2016-02-14 DIAGNOSIS — E538 Deficiency of other specified B group vitamins: Secondary | ICD-10-CM | POA: Insufficient documentation

## 2016-02-14 DIAGNOSIS — Z0000 Encounter for general adult medical examination without abnormal findings: Secondary | ICD-10-CM | POA: Diagnosis not present

## 2016-02-14 DIAGNOSIS — E1140 Type 2 diabetes mellitus with diabetic neuropathy, unspecified: Secondary | ICD-10-CM | POA: Diagnosis not present

## 2016-03-11 DIAGNOSIS — Z85828 Personal history of other malignant neoplasm of skin: Secondary | ICD-10-CM | POA: Diagnosis not present

## 2016-03-11 DIAGNOSIS — X32XXXA Exposure to sunlight, initial encounter: Secondary | ICD-10-CM | POA: Diagnosis not present

## 2016-03-11 DIAGNOSIS — L218 Other seborrheic dermatitis: Secondary | ICD-10-CM | POA: Diagnosis not present

## 2016-03-11 DIAGNOSIS — Z08 Encounter for follow-up examination after completed treatment for malignant neoplasm: Secondary | ICD-10-CM | POA: Diagnosis not present

## 2016-03-11 DIAGNOSIS — D485 Neoplasm of uncertain behavior of skin: Secondary | ICD-10-CM | POA: Diagnosis not present

## 2016-03-11 DIAGNOSIS — Z872 Personal history of diseases of the skin and subcutaneous tissue: Secondary | ICD-10-CM | POA: Diagnosis not present

## 2016-03-11 DIAGNOSIS — C44329 Squamous cell carcinoma of skin of other parts of face: Secondary | ICD-10-CM | POA: Diagnosis not present

## 2016-03-11 DIAGNOSIS — L570 Actinic keratosis: Secondary | ICD-10-CM | POA: Diagnosis not present

## 2016-03-23 DIAGNOSIS — E119 Type 2 diabetes mellitus without complications: Secondary | ICD-10-CM | POA: Diagnosis not present

## 2016-03-31 DIAGNOSIS — Z85828 Personal history of other malignant neoplasm of skin: Secondary | ICD-10-CM | POA: Diagnosis not present

## 2016-03-31 DIAGNOSIS — C44329 Squamous cell carcinoma of skin of other parts of face: Secondary | ICD-10-CM | POA: Diagnosis not present

## 2016-04-16 DIAGNOSIS — H04563 Stenosis of bilateral lacrimal punctum: Secondary | ICD-10-CM | POA: Diagnosis not present

## 2016-04-16 DIAGNOSIS — H02135 Senile ectropion of left lower eyelid: Secondary | ICD-10-CM | POA: Diagnosis not present

## 2016-04-16 DIAGNOSIS — H02132 Senile ectropion of right lower eyelid: Secondary | ICD-10-CM | POA: Diagnosis not present

## 2016-04-21 DIAGNOSIS — E538 Deficiency of other specified B group vitamins: Secondary | ICD-10-CM | POA: Diagnosis not present

## 2016-04-21 DIAGNOSIS — I420 Dilated cardiomyopathy: Secondary | ICD-10-CM | POA: Diagnosis not present

## 2016-04-28 DIAGNOSIS — I6523 Occlusion and stenosis of bilateral carotid arteries: Secondary | ICD-10-CM | POA: Diagnosis not present

## 2016-04-28 DIAGNOSIS — I5022 Chronic systolic (congestive) heart failure: Secondary | ICD-10-CM | POA: Diagnosis not present

## 2016-04-28 DIAGNOSIS — E1140 Type 2 diabetes mellitus with diabetic neuropathy, unspecified: Secondary | ICD-10-CM | POA: Diagnosis not present

## 2016-04-28 DIAGNOSIS — I447 Left bundle-branch block, unspecified: Secondary | ICD-10-CM | POA: Diagnosis not present

## 2016-04-29 DIAGNOSIS — I447 Left bundle-branch block, unspecified: Secondary | ICD-10-CM | POA: Insufficient documentation

## 2016-04-29 DIAGNOSIS — I5022 Chronic systolic (congestive) heart failure: Secondary | ICD-10-CM | POA: Diagnosis not present

## 2016-04-29 DIAGNOSIS — Z01818 Encounter for other preprocedural examination: Secondary | ICD-10-CM | POA: Diagnosis not present

## 2016-04-29 DIAGNOSIS — I639 Cerebral infarction, unspecified: Secondary | ICD-10-CM | POA: Diagnosis not present

## 2016-04-29 DIAGNOSIS — E1140 Type 2 diabetes mellitus with diabetic neuropathy, unspecified: Secondary | ICD-10-CM | POA: Diagnosis not present

## 2016-04-29 DIAGNOSIS — M1A00X0 Idiopathic chronic gout, unspecified site, without tophus (tophi): Secondary | ICD-10-CM | POA: Diagnosis not present

## 2016-04-29 DIAGNOSIS — I10 Essential (primary) hypertension: Secondary | ICD-10-CM | POA: Diagnosis not present

## 2016-05-05 DIAGNOSIS — I5022 Chronic systolic (congestive) heart failure: Secondary | ICD-10-CM | POA: Diagnosis not present

## 2016-05-05 DIAGNOSIS — I110 Hypertensive heart disease with heart failure: Secondary | ICD-10-CM | POA: Diagnosis not present

## 2016-05-05 DIAGNOSIS — H04223 Epiphora due to insufficient drainage, bilateral lacrimal glands: Secondary | ICD-10-CM | POA: Diagnosis not present

## 2016-05-05 DIAGNOSIS — H02112 Cicatricial ectropion of right lower eyelid: Secondary | ICD-10-CM | POA: Diagnosis not present

## 2016-05-05 DIAGNOSIS — H02115 Cicatricial ectropion of left lower eyelid: Secondary | ICD-10-CM | POA: Diagnosis not present

## 2016-05-05 DIAGNOSIS — H04203 Unspecified epiphora, bilateral lacrimal glands: Secondary | ICD-10-CM | POA: Diagnosis not present

## 2016-05-05 DIAGNOSIS — E1140 Type 2 diabetes mellitus with diabetic neuropathy, unspecified: Secondary | ICD-10-CM | POA: Diagnosis not present

## 2016-05-05 DIAGNOSIS — M1A9XX0 Chronic gout, unspecified, without tophus (tophi): Secondary | ICD-10-CM | POA: Diagnosis not present

## 2016-05-05 DIAGNOSIS — H02132 Senile ectropion of right lower eyelid: Secondary | ICD-10-CM | POA: Diagnosis not present

## 2016-05-05 DIAGNOSIS — H02135 Senile ectropion of left lower eyelid: Secondary | ICD-10-CM | POA: Diagnosis not present

## 2016-05-05 DIAGNOSIS — E669 Obesity, unspecified: Secondary | ICD-10-CM | POA: Diagnosis not present

## 2016-05-05 DIAGNOSIS — Z95810 Presence of automatic (implantable) cardiac defibrillator: Secondary | ICD-10-CM | POA: Diagnosis not present

## 2016-05-22 DIAGNOSIS — J40 Bronchitis, not specified as acute or chronic: Secondary | ICD-10-CM | POA: Diagnosis not present

## 2016-07-21 DIAGNOSIS — I420 Dilated cardiomyopathy: Secondary | ICD-10-CM | POA: Diagnosis not present

## 2016-07-24 DIAGNOSIS — M9905 Segmental and somatic dysfunction of pelvic region: Secondary | ICD-10-CM | POA: Diagnosis not present

## 2016-07-24 DIAGNOSIS — M791 Myalgia: Secondary | ICD-10-CM | POA: Diagnosis not present

## 2016-07-24 DIAGNOSIS — Z72820 Sleep deprivation: Secondary | ICD-10-CM | POA: Diagnosis not present

## 2016-07-24 DIAGNOSIS — M9902 Segmental and somatic dysfunction of thoracic region: Secondary | ICD-10-CM | POA: Diagnosis not present

## 2016-07-24 DIAGNOSIS — M6240 Contracture of muscle, unspecified site: Secondary | ICD-10-CM | POA: Diagnosis not present

## 2016-07-24 DIAGNOSIS — M5430 Sciatica, unspecified side: Secondary | ICD-10-CM | POA: Diagnosis not present

## 2016-07-24 DIAGNOSIS — M9903 Segmental and somatic dysfunction of lumbar region: Secondary | ICD-10-CM | POA: Diagnosis not present

## 2016-07-27 DIAGNOSIS — M5430 Sciatica, unspecified side: Secondary | ICD-10-CM | POA: Diagnosis not present

## 2016-07-27 DIAGNOSIS — M791 Myalgia: Secondary | ICD-10-CM | POA: Diagnosis not present

## 2016-07-27 DIAGNOSIS — M6240 Contracture of muscle, unspecified site: Secondary | ICD-10-CM | POA: Diagnosis not present

## 2016-07-27 DIAGNOSIS — M9902 Segmental and somatic dysfunction of thoracic region: Secondary | ICD-10-CM | POA: Diagnosis not present

## 2016-07-27 DIAGNOSIS — Z72820 Sleep deprivation: Secondary | ICD-10-CM | POA: Diagnosis not present

## 2016-07-27 DIAGNOSIS — M9903 Segmental and somatic dysfunction of lumbar region: Secondary | ICD-10-CM | POA: Diagnosis not present

## 2016-07-27 DIAGNOSIS — M9905 Segmental and somatic dysfunction of pelvic region: Secondary | ICD-10-CM | POA: Diagnosis not present

## 2016-07-31 DIAGNOSIS — M9902 Segmental and somatic dysfunction of thoracic region: Secondary | ICD-10-CM | POA: Diagnosis not present

## 2016-07-31 DIAGNOSIS — M5430 Sciatica, unspecified side: Secondary | ICD-10-CM | POA: Diagnosis not present

## 2016-07-31 DIAGNOSIS — M791 Myalgia: Secondary | ICD-10-CM | POA: Diagnosis not present

## 2016-07-31 DIAGNOSIS — M9903 Segmental and somatic dysfunction of lumbar region: Secondary | ICD-10-CM | POA: Diagnosis not present

## 2016-07-31 DIAGNOSIS — Z72820 Sleep deprivation: Secondary | ICD-10-CM | POA: Diagnosis not present

## 2016-07-31 DIAGNOSIS — M9905 Segmental and somatic dysfunction of pelvic region: Secondary | ICD-10-CM | POA: Diagnosis not present

## 2016-07-31 DIAGNOSIS — M6240 Contracture of muscle, unspecified site: Secondary | ICD-10-CM | POA: Diagnosis not present

## 2016-08-03 DIAGNOSIS — M9902 Segmental and somatic dysfunction of thoracic region: Secondary | ICD-10-CM | POA: Diagnosis not present

## 2016-08-03 DIAGNOSIS — M9903 Segmental and somatic dysfunction of lumbar region: Secondary | ICD-10-CM | POA: Diagnosis not present

## 2016-08-03 DIAGNOSIS — M791 Myalgia: Secondary | ICD-10-CM | POA: Diagnosis not present

## 2016-08-03 DIAGNOSIS — M9905 Segmental and somatic dysfunction of pelvic region: Secondary | ICD-10-CM | POA: Diagnosis not present

## 2016-08-03 DIAGNOSIS — Z72820 Sleep deprivation: Secondary | ICD-10-CM | POA: Diagnosis not present

## 2016-08-03 DIAGNOSIS — M6240 Contracture of muscle, unspecified site: Secondary | ICD-10-CM | POA: Diagnosis not present

## 2016-08-03 DIAGNOSIS — M5430 Sciatica, unspecified side: Secondary | ICD-10-CM | POA: Diagnosis not present

## 2016-08-07 DIAGNOSIS — Z72820 Sleep deprivation: Secondary | ICD-10-CM | POA: Diagnosis not present

## 2016-08-07 DIAGNOSIS — M5430 Sciatica, unspecified side: Secondary | ICD-10-CM | POA: Diagnosis not present

## 2016-08-07 DIAGNOSIS — M9902 Segmental and somatic dysfunction of thoracic region: Secondary | ICD-10-CM | POA: Diagnosis not present

## 2016-08-07 DIAGNOSIS — E538 Deficiency of other specified B group vitamins: Secondary | ICD-10-CM | POA: Diagnosis not present

## 2016-08-07 DIAGNOSIS — E1140 Type 2 diabetes mellitus with diabetic neuropathy, unspecified: Secondary | ICD-10-CM | POA: Diagnosis not present

## 2016-08-07 DIAGNOSIS — M6240 Contracture of muscle, unspecified site: Secondary | ICD-10-CM | POA: Diagnosis not present

## 2016-08-07 DIAGNOSIS — M9903 Segmental and somatic dysfunction of lumbar region: Secondary | ICD-10-CM | POA: Diagnosis not present

## 2016-08-07 DIAGNOSIS — M9905 Segmental and somatic dysfunction of pelvic region: Secondary | ICD-10-CM | POA: Diagnosis not present

## 2016-08-07 DIAGNOSIS — M791 Myalgia: Secondary | ICD-10-CM | POA: Diagnosis not present

## 2016-08-10 DIAGNOSIS — M9903 Segmental and somatic dysfunction of lumbar region: Secondary | ICD-10-CM | POA: Diagnosis not present

## 2016-08-10 DIAGNOSIS — M6240 Contracture of muscle, unspecified site: Secondary | ICD-10-CM | POA: Diagnosis not present

## 2016-08-10 DIAGNOSIS — M791 Myalgia: Secondary | ICD-10-CM | POA: Diagnosis not present

## 2016-08-10 DIAGNOSIS — M5430 Sciatica, unspecified side: Secondary | ICD-10-CM | POA: Diagnosis not present

## 2016-08-10 DIAGNOSIS — M9905 Segmental and somatic dysfunction of pelvic region: Secondary | ICD-10-CM | POA: Diagnosis not present

## 2016-08-10 DIAGNOSIS — M9902 Segmental and somatic dysfunction of thoracic region: Secondary | ICD-10-CM | POA: Diagnosis not present

## 2016-08-10 DIAGNOSIS — Z72820 Sleep deprivation: Secondary | ICD-10-CM | POA: Diagnosis not present

## 2016-08-14 DIAGNOSIS — M791 Myalgia: Secondary | ICD-10-CM | POA: Diagnosis not present

## 2016-08-14 DIAGNOSIS — Z72820 Sleep deprivation: Secondary | ICD-10-CM | POA: Diagnosis not present

## 2016-08-14 DIAGNOSIS — M9902 Segmental and somatic dysfunction of thoracic region: Secondary | ICD-10-CM | POA: Diagnosis not present

## 2016-08-14 DIAGNOSIS — M5430 Sciatica, unspecified side: Secondary | ICD-10-CM | POA: Diagnosis not present

## 2016-08-14 DIAGNOSIS — M9903 Segmental and somatic dysfunction of lumbar region: Secondary | ICD-10-CM | POA: Diagnosis not present

## 2016-08-14 DIAGNOSIS — M9905 Segmental and somatic dysfunction of pelvic region: Secondary | ICD-10-CM | POA: Diagnosis not present

## 2016-08-14 DIAGNOSIS — M6240 Contracture of muscle, unspecified site: Secondary | ICD-10-CM | POA: Diagnosis not present

## 2016-08-17 DIAGNOSIS — M9903 Segmental and somatic dysfunction of lumbar region: Secondary | ICD-10-CM | POA: Diagnosis not present

## 2016-08-17 DIAGNOSIS — M5430 Sciatica, unspecified side: Secondary | ICD-10-CM | POA: Diagnosis not present

## 2016-08-17 DIAGNOSIS — M9905 Segmental and somatic dysfunction of pelvic region: Secondary | ICD-10-CM | POA: Diagnosis not present

## 2016-08-17 DIAGNOSIS — M791 Myalgia: Secondary | ICD-10-CM | POA: Diagnosis not present

## 2016-08-17 DIAGNOSIS — Z72820 Sleep deprivation: Secondary | ICD-10-CM | POA: Diagnosis not present

## 2016-08-17 DIAGNOSIS — M9902 Segmental and somatic dysfunction of thoracic region: Secondary | ICD-10-CM | POA: Diagnosis not present

## 2016-08-17 DIAGNOSIS — M6240 Contracture of muscle, unspecified site: Secondary | ICD-10-CM | POA: Diagnosis not present

## 2016-08-20 DIAGNOSIS — Z125 Encounter for screening for malignant neoplasm of prostate: Secondary | ICD-10-CM | POA: Diagnosis not present

## 2016-08-20 DIAGNOSIS — E538 Deficiency of other specified B group vitamins: Secondary | ICD-10-CM | POA: Diagnosis not present

## 2016-08-20 DIAGNOSIS — I700 Atherosclerosis of aorta: Secondary | ICD-10-CM | POA: Diagnosis not present

## 2016-08-20 DIAGNOSIS — E1140 Type 2 diabetes mellitus with diabetic neuropathy, unspecified: Secondary | ICD-10-CM | POA: Diagnosis not present

## 2016-08-21 DIAGNOSIS — M9905 Segmental and somatic dysfunction of pelvic region: Secondary | ICD-10-CM | POA: Diagnosis not present

## 2016-08-21 DIAGNOSIS — M5430 Sciatica, unspecified side: Secondary | ICD-10-CM | POA: Diagnosis not present

## 2016-08-21 DIAGNOSIS — M9902 Segmental and somatic dysfunction of thoracic region: Secondary | ICD-10-CM | POA: Diagnosis not present

## 2016-08-21 DIAGNOSIS — M6240 Contracture of muscle, unspecified site: Secondary | ICD-10-CM | POA: Diagnosis not present

## 2016-08-21 DIAGNOSIS — Z72820 Sleep deprivation: Secondary | ICD-10-CM | POA: Diagnosis not present

## 2016-08-21 DIAGNOSIS — M9903 Segmental and somatic dysfunction of lumbar region: Secondary | ICD-10-CM | POA: Diagnosis not present

## 2016-08-21 DIAGNOSIS — M791 Myalgia: Secondary | ICD-10-CM | POA: Diagnosis not present

## 2016-08-26 DIAGNOSIS — M6240 Contracture of muscle, unspecified site: Secondary | ICD-10-CM | POA: Diagnosis not present

## 2016-08-26 DIAGNOSIS — M9903 Segmental and somatic dysfunction of lumbar region: Secondary | ICD-10-CM | POA: Diagnosis not present

## 2016-08-26 DIAGNOSIS — M9902 Segmental and somatic dysfunction of thoracic region: Secondary | ICD-10-CM | POA: Diagnosis not present

## 2016-08-26 DIAGNOSIS — M9905 Segmental and somatic dysfunction of pelvic region: Secondary | ICD-10-CM | POA: Diagnosis not present

## 2016-08-26 DIAGNOSIS — M5430 Sciatica, unspecified side: Secondary | ICD-10-CM | POA: Diagnosis not present

## 2016-08-26 DIAGNOSIS — M791 Myalgia: Secondary | ICD-10-CM | POA: Diagnosis not present

## 2016-08-26 DIAGNOSIS — Z72820 Sleep deprivation: Secondary | ICD-10-CM | POA: Diagnosis not present

## 2016-08-31 DIAGNOSIS — M9903 Segmental and somatic dysfunction of lumbar region: Secondary | ICD-10-CM | POA: Diagnosis not present

## 2016-08-31 DIAGNOSIS — Z72820 Sleep deprivation: Secondary | ICD-10-CM | POA: Diagnosis not present

## 2016-08-31 DIAGNOSIS — M9902 Segmental and somatic dysfunction of thoracic region: Secondary | ICD-10-CM | POA: Diagnosis not present

## 2016-08-31 DIAGNOSIS — M5430 Sciatica, unspecified side: Secondary | ICD-10-CM | POA: Diagnosis not present

## 2016-08-31 DIAGNOSIS — M9905 Segmental and somatic dysfunction of pelvic region: Secondary | ICD-10-CM | POA: Diagnosis not present

## 2016-08-31 DIAGNOSIS — M6240 Contracture of muscle, unspecified site: Secondary | ICD-10-CM | POA: Diagnosis not present

## 2016-08-31 DIAGNOSIS — M791 Myalgia: Secondary | ICD-10-CM | POA: Diagnosis not present

## 2016-09-07 DIAGNOSIS — M5430 Sciatica, unspecified side: Secondary | ICD-10-CM | POA: Diagnosis not present

## 2016-09-07 DIAGNOSIS — Z72820 Sleep deprivation: Secondary | ICD-10-CM | POA: Diagnosis not present

## 2016-09-07 DIAGNOSIS — M9902 Segmental and somatic dysfunction of thoracic region: Secondary | ICD-10-CM | POA: Diagnosis not present

## 2016-09-07 DIAGNOSIS — M9903 Segmental and somatic dysfunction of lumbar region: Secondary | ICD-10-CM | POA: Diagnosis not present

## 2016-09-07 DIAGNOSIS — M791 Myalgia: Secondary | ICD-10-CM | POA: Diagnosis not present

## 2016-09-07 DIAGNOSIS — M9905 Segmental and somatic dysfunction of pelvic region: Secondary | ICD-10-CM | POA: Diagnosis not present

## 2016-09-07 DIAGNOSIS — M6240 Contracture of muscle, unspecified site: Secondary | ICD-10-CM | POA: Diagnosis not present

## 2016-09-21 DIAGNOSIS — E119 Type 2 diabetes mellitus without complications: Secondary | ICD-10-CM | POA: Diagnosis not present

## 2016-10-20 DIAGNOSIS — I420 Dilated cardiomyopathy: Secondary | ICD-10-CM | POA: Diagnosis not present

## 2016-12-23 DIAGNOSIS — J40 Bronchitis, not specified as acute or chronic: Secondary | ICD-10-CM | POA: Diagnosis not present

## 2017-01-04 DIAGNOSIS — J40 Bronchitis, not specified as acute or chronic: Secondary | ICD-10-CM | POA: Diagnosis not present

## 2017-01-04 DIAGNOSIS — E1140 Type 2 diabetes mellitus with diabetic neuropathy, unspecified: Secondary | ICD-10-CM | POA: Diagnosis not present

## 2017-01-08 DIAGNOSIS — J069 Acute upper respiratory infection, unspecified: Secondary | ICD-10-CM | POA: Diagnosis not present

## 2017-01-08 DIAGNOSIS — R05 Cough: Secondary | ICD-10-CM | POA: Diagnosis not present

## 2017-01-15 DIAGNOSIS — J40 Bronchitis, not specified as acute or chronic: Secondary | ICD-10-CM | POA: Diagnosis not present

## 2017-01-19 DIAGNOSIS — I420 Dilated cardiomyopathy: Secondary | ICD-10-CM | POA: Diagnosis not present

## 2017-02-16 DIAGNOSIS — E1140 Type 2 diabetes mellitus with diabetic neuropathy, unspecified: Secondary | ICD-10-CM | POA: Diagnosis not present

## 2017-02-16 DIAGNOSIS — E538 Deficiency of other specified B group vitamins: Secondary | ICD-10-CM | POA: Diagnosis not present

## 2017-02-16 DIAGNOSIS — Z125 Encounter for screening for malignant neoplasm of prostate: Secondary | ICD-10-CM | POA: Diagnosis not present

## 2017-02-17 DIAGNOSIS — R040 Epistaxis: Secondary | ICD-10-CM | POA: Diagnosis not present

## 2017-02-17 DIAGNOSIS — E1140 Type 2 diabetes mellitus with diabetic neuropathy, unspecified: Secondary | ICD-10-CM | POA: Diagnosis not present

## 2017-02-23 DIAGNOSIS — M1A00X0 Idiopathic chronic gout, unspecified site, without tophus (tophi): Secondary | ICD-10-CM | POA: Diagnosis not present

## 2017-02-23 DIAGNOSIS — L570 Actinic keratosis: Secondary | ICD-10-CM | POA: Diagnosis not present

## 2017-02-23 DIAGNOSIS — E1140 Type 2 diabetes mellitus with diabetic neuropathy, unspecified: Secondary | ICD-10-CM | POA: Diagnosis not present

## 2017-02-23 DIAGNOSIS — I5022 Chronic systolic (congestive) heart failure: Secondary | ICD-10-CM | POA: Diagnosis not present

## 2017-02-23 DIAGNOSIS — I700 Atherosclerosis of aorta: Secondary | ICD-10-CM | POA: Diagnosis not present

## 2017-02-23 DIAGNOSIS — E538 Deficiency of other specified B group vitamins: Secondary | ICD-10-CM | POA: Diagnosis not present

## 2017-02-23 DIAGNOSIS — E1142 Type 2 diabetes mellitus with diabetic polyneuropathy: Secondary | ICD-10-CM | POA: Diagnosis not present

## 2017-02-23 DIAGNOSIS — Z0000 Encounter for general adult medical examination without abnormal findings: Secondary | ICD-10-CM | POA: Diagnosis not present

## 2017-02-23 DIAGNOSIS — T83410A Breakdown (mechanical) of penile (implanted) prosthesis, initial encounter: Secondary | ICD-10-CM | POA: Diagnosis not present

## 2017-03-08 DIAGNOSIS — M5430 Sciatica, unspecified side: Secondary | ICD-10-CM | POA: Diagnosis not present

## 2017-03-08 DIAGNOSIS — Z72820 Sleep deprivation: Secondary | ICD-10-CM | POA: Diagnosis not present

## 2017-03-08 DIAGNOSIS — M9905 Segmental and somatic dysfunction of pelvic region: Secondary | ICD-10-CM | POA: Diagnosis not present

## 2017-03-08 DIAGNOSIS — M6240 Contracture of muscle, unspecified site: Secondary | ICD-10-CM | POA: Diagnosis not present

## 2017-03-08 DIAGNOSIS — M9903 Segmental and somatic dysfunction of lumbar region: Secondary | ICD-10-CM | POA: Diagnosis not present

## 2017-03-08 DIAGNOSIS — M9902 Segmental and somatic dysfunction of thoracic region: Secondary | ICD-10-CM | POA: Diagnosis not present

## 2017-03-08 DIAGNOSIS — M791 Myalgia, unspecified site: Secondary | ICD-10-CM | POA: Diagnosis not present

## 2017-03-12 DIAGNOSIS — M5430 Sciatica, unspecified side: Secondary | ICD-10-CM | POA: Diagnosis not present

## 2017-03-12 DIAGNOSIS — M9903 Segmental and somatic dysfunction of lumbar region: Secondary | ICD-10-CM | POA: Diagnosis not present

## 2017-03-12 DIAGNOSIS — M9902 Segmental and somatic dysfunction of thoracic region: Secondary | ICD-10-CM | POA: Diagnosis not present

## 2017-03-12 DIAGNOSIS — M9905 Segmental and somatic dysfunction of pelvic region: Secondary | ICD-10-CM | POA: Diagnosis not present

## 2017-03-12 DIAGNOSIS — M791 Myalgia, unspecified site: Secondary | ICD-10-CM | POA: Diagnosis not present

## 2017-03-12 DIAGNOSIS — Z72820 Sleep deprivation: Secondary | ICD-10-CM | POA: Diagnosis not present

## 2017-03-12 DIAGNOSIS — M6240 Contracture of muscle, unspecified site: Secondary | ICD-10-CM | POA: Diagnosis not present

## 2017-03-15 DIAGNOSIS — Z72820 Sleep deprivation: Secondary | ICD-10-CM | POA: Diagnosis not present

## 2017-03-15 DIAGNOSIS — M9903 Segmental and somatic dysfunction of lumbar region: Secondary | ICD-10-CM | POA: Diagnosis not present

## 2017-03-15 DIAGNOSIS — M9902 Segmental and somatic dysfunction of thoracic region: Secondary | ICD-10-CM | POA: Diagnosis not present

## 2017-03-15 DIAGNOSIS — M9905 Segmental and somatic dysfunction of pelvic region: Secondary | ICD-10-CM | POA: Diagnosis not present

## 2017-03-15 DIAGNOSIS — M791 Myalgia, unspecified site: Secondary | ICD-10-CM | POA: Diagnosis not present

## 2017-03-15 DIAGNOSIS — M5430 Sciatica, unspecified side: Secondary | ICD-10-CM | POA: Diagnosis not present

## 2017-03-15 DIAGNOSIS — M6240 Contracture of muscle, unspecified site: Secondary | ICD-10-CM | POA: Diagnosis not present

## 2017-03-17 DIAGNOSIS — M791 Myalgia, unspecified site: Secondary | ICD-10-CM | POA: Diagnosis not present

## 2017-03-17 DIAGNOSIS — Z72820 Sleep deprivation: Secondary | ICD-10-CM | POA: Diagnosis not present

## 2017-03-17 DIAGNOSIS — M9902 Segmental and somatic dysfunction of thoracic region: Secondary | ICD-10-CM | POA: Diagnosis not present

## 2017-03-17 DIAGNOSIS — M9903 Segmental and somatic dysfunction of lumbar region: Secondary | ICD-10-CM | POA: Diagnosis not present

## 2017-03-17 DIAGNOSIS — M5430 Sciatica, unspecified side: Secondary | ICD-10-CM | POA: Diagnosis not present

## 2017-03-17 DIAGNOSIS — M9905 Segmental and somatic dysfunction of pelvic region: Secondary | ICD-10-CM | POA: Diagnosis not present

## 2017-03-17 DIAGNOSIS — M6240 Contracture of muscle, unspecified site: Secondary | ICD-10-CM | POA: Diagnosis not present

## 2017-03-18 ENCOUNTER — Ambulatory Visit: Payer: Self-pay | Admitting: Urology

## 2017-03-19 DIAGNOSIS — M791 Myalgia, unspecified site: Secondary | ICD-10-CM | POA: Diagnosis not present

## 2017-03-19 DIAGNOSIS — Z72820 Sleep deprivation: Secondary | ICD-10-CM | POA: Diagnosis not present

## 2017-03-19 DIAGNOSIS — M9902 Segmental and somatic dysfunction of thoracic region: Secondary | ICD-10-CM | POA: Diagnosis not present

## 2017-03-19 DIAGNOSIS — M9905 Segmental and somatic dysfunction of pelvic region: Secondary | ICD-10-CM | POA: Diagnosis not present

## 2017-03-19 DIAGNOSIS — M6240 Contracture of muscle, unspecified site: Secondary | ICD-10-CM | POA: Diagnosis not present

## 2017-03-19 DIAGNOSIS — M9903 Segmental and somatic dysfunction of lumbar region: Secondary | ICD-10-CM | POA: Diagnosis not present

## 2017-03-19 DIAGNOSIS — M5430 Sciatica, unspecified side: Secondary | ICD-10-CM | POA: Diagnosis not present

## 2017-03-22 DIAGNOSIS — Z72820 Sleep deprivation: Secondary | ICD-10-CM | POA: Diagnosis not present

## 2017-03-22 DIAGNOSIS — M5430 Sciatica, unspecified side: Secondary | ICD-10-CM | POA: Diagnosis not present

## 2017-03-22 DIAGNOSIS — M9903 Segmental and somatic dysfunction of lumbar region: Secondary | ICD-10-CM | POA: Diagnosis not present

## 2017-03-22 DIAGNOSIS — M9905 Segmental and somatic dysfunction of pelvic region: Secondary | ICD-10-CM | POA: Diagnosis not present

## 2017-03-22 DIAGNOSIS — M791 Myalgia, unspecified site: Secondary | ICD-10-CM | POA: Diagnosis not present

## 2017-03-22 DIAGNOSIS — M6240 Contracture of muscle, unspecified site: Secondary | ICD-10-CM | POA: Diagnosis not present

## 2017-03-22 DIAGNOSIS — M9902 Segmental and somatic dysfunction of thoracic region: Secondary | ICD-10-CM | POA: Diagnosis not present

## 2017-03-24 DIAGNOSIS — M5430 Sciatica, unspecified side: Secondary | ICD-10-CM | POA: Diagnosis not present

## 2017-03-24 DIAGNOSIS — M9903 Segmental and somatic dysfunction of lumbar region: Secondary | ICD-10-CM | POA: Diagnosis not present

## 2017-03-24 DIAGNOSIS — Z72820 Sleep deprivation: Secondary | ICD-10-CM | POA: Diagnosis not present

## 2017-03-24 DIAGNOSIS — M6240 Contracture of muscle, unspecified site: Secondary | ICD-10-CM | POA: Diagnosis not present

## 2017-03-24 DIAGNOSIS — M9905 Segmental and somatic dysfunction of pelvic region: Secondary | ICD-10-CM | POA: Diagnosis not present

## 2017-03-24 DIAGNOSIS — M791 Myalgia, unspecified site: Secondary | ICD-10-CM | POA: Diagnosis not present

## 2017-03-24 DIAGNOSIS — M9902 Segmental and somatic dysfunction of thoracic region: Secondary | ICD-10-CM | POA: Diagnosis not present

## 2017-03-26 DIAGNOSIS — Z85828 Personal history of other malignant neoplasm of skin: Secondary | ICD-10-CM | POA: Diagnosis not present

## 2017-03-26 DIAGNOSIS — Z872 Personal history of diseases of the skin and subcutaneous tissue: Secondary | ICD-10-CM | POA: Diagnosis not present

## 2017-03-26 DIAGNOSIS — M9905 Segmental and somatic dysfunction of pelvic region: Secondary | ICD-10-CM | POA: Diagnosis not present

## 2017-03-26 DIAGNOSIS — X32XXXA Exposure to sunlight, initial encounter: Secondary | ICD-10-CM | POA: Diagnosis not present

## 2017-03-26 DIAGNOSIS — M6240 Contracture of muscle, unspecified site: Secondary | ICD-10-CM | POA: Diagnosis not present

## 2017-03-26 DIAGNOSIS — M9902 Segmental and somatic dysfunction of thoracic region: Secondary | ICD-10-CM | POA: Diagnosis not present

## 2017-03-26 DIAGNOSIS — L570 Actinic keratosis: Secondary | ICD-10-CM | POA: Diagnosis not present

## 2017-03-26 DIAGNOSIS — M5430 Sciatica, unspecified side: Secondary | ICD-10-CM | POA: Diagnosis not present

## 2017-03-26 DIAGNOSIS — Z72820 Sleep deprivation: Secondary | ICD-10-CM | POA: Diagnosis not present

## 2017-03-26 DIAGNOSIS — M9903 Segmental and somatic dysfunction of lumbar region: Secondary | ICD-10-CM | POA: Diagnosis not present

## 2017-03-26 DIAGNOSIS — Z08 Encounter for follow-up examination after completed treatment for malignant neoplasm: Secondary | ICD-10-CM | POA: Diagnosis not present

## 2017-03-26 DIAGNOSIS — M791 Myalgia, unspecified site: Secondary | ICD-10-CM | POA: Diagnosis not present

## 2017-03-29 DIAGNOSIS — M5430 Sciatica, unspecified side: Secondary | ICD-10-CM | POA: Diagnosis not present

## 2017-03-29 DIAGNOSIS — M791 Myalgia, unspecified site: Secondary | ICD-10-CM | POA: Diagnosis not present

## 2017-03-29 DIAGNOSIS — M9905 Segmental and somatic dysfunction of pelvic region: Secondary | ICD-10-CM | POA: Diagnosis not present

## 2017-03-29 DIAGNOSIS — M9903 Segmental and somatic dysfunction of lumbar region: Secondary | ICD-10-CM | POA: Diagnosis not present

## 2017-03-29 DIAGNOSIS — M6240 Contracture of muscle, unspecified site: Secondary | ICD-10-CM | POA: Diagnosis not present

## 2017-03-29 DIAGNOSIS — Z72820 Sleep deprivation: Secondary | ICD-10-CM | POA: Diagnosis not present

## 2017-03-29 DIAGNOSIS — M9902 Segmental and somatic dysfunction of thoracic region: Secondary | ICD-10-CM | POA: Diagnosis not present

## 2017-04-02 DIAGNOSIS — M5430 Sciatica, unspecified side: Secondary | ICD-10-CM | POA: Diagnosis not present

## 2017-04-02 DIAGNOSIS — M9905 Segmental and somatic dysfunction of pelvic region: Secondary | ICD-10-CM | POA: Diagnosis not present

## 2017-04-02 DIAGNOSIS — M6240 Contracture of muscle, unspecified site: Secondary | ICD-10-CM | POA: Diagnosis not present

## 2017-04-02 DIAGNOSIS — M9903 Segmental and somatic dysfunction of lumbar region: Secondary | ICD-10-CM | POA: Diagnosis not present

## 2017-04-02 DIAGNOSIS — M9902 Segmental and somatic dysfunction of thoracic region: Secondary | ICD-10-CM | POA: Diagnosis not present

## 2017-04-02 DIAGNOSIS — M791 Myalgia, unspecified site: Secondary | ICD-10-CM | POA: Diagnosis not present

## 2017-04-02 DIAGNOSIS — Z72820 Sleep deprivation: Secondary | ICD-10-CM | POA: Diagnosis not present

## 2017-04-05 DIAGNOSIS — M9902 Segmental and somatic dysfunction of thoracic region: Secondary | ICD-10-CM | POA: Diagnosis not present

## 2017-04-05 DIAGNOSIS — M9905 Segmental and somatic dysfunction of pelvic region: Secondary | ICD-10-CM | POA: Diagnosis not present

## 2017-04-05 DIAGNOSIS — Z72820 Sleep deprivation: Secondary | ICD-10-CM | POA: Diagnosis not present

## 2017-04-05 DIAGNOSIS — M5430 Sciatica, unspecified side: Secondary | ICD-10-CM | POA: Diagnosis not present

## 2017-04-05 DIAGNOSIS — M6240 Contracture of muscle, unspecified site: Secondary | ICD-10-CM | POA: Diagnosis not present

## 2017-04-05 DIAGNOSIS — M9903 Segmental and somatic dysfunction of lumbar region: Secondary | ICD-10-CM | POA: Diagnosis not present

## 2017-04-05 DIAGNOSIS — M791 Myalgia, unspecified site: Secondary | ICD-10-CM | POA: Diagnosis not present

## 2017-04-07 ENCOUNTER — Ambulatory Visit: Payer: Self-pay | Admitting: Urology

## 2017-04-07 DIAGNOSIS — M5430 Sciatica, unspecified side: Secondary | ICD-10-CM | POA: Diagnosis not present

## 2017-04-07 DIAGNOSIS — M6240 Contracture of muscle, unspecified site: Secondary | ICD-10-CM | POA: Diagnosis not present

## 2017-04-07 DIAGNOSIS — M9905 Segmental and somatic dysfunction of pelvic region: Secondary | ICD-10-CM | POA: Diagnosis not present

## 2017-04-07 DIAGNOSIS — M791 Myalgia, unspecified site: Secondary | ICD-10-CM | POA: Diagnosis not present

## 2017-04-07 DIAGNOSIS — M9902 Segmental and somatic dysfunction of thoracic region: Secondary | ICD-10-CM | POA: Diagnosis not present

## 2017-04-07 DIAGNOSIS — M9903 Segmental and somatic dysfunction of lumbar region: Secondary | ICD-10-CM | POA: Diagnosis not present

## 2017-04-07 DIAGNOSIS — Z72820 Sleep deprivation: Secondary | ICD-10-CM | POA: Diagnosis not present

## 2017-04-12 DIAGNOSIS — Z72820 Sleep deprivation: Secondary | ICD-10-CM | POA: Diagnosis not present

## 2017-04-12 DIAGNOSIS — M9905 Segmental and somatic dysfunction of pelvic region: Secondary | ICD-10-CM | POA: Diagnosis not present

## 2017-04-12 DIAGNOSIS — M9902 Segmental and somatic dysfunction of thoracic region: Secondary | ICD-10-CM | POA: Diagnosis not present

## 2017-04-12 DIAGNOSIS — M6240 Contracture of muscle, unspecified site: Secondary | ICD-10-CM | POA: Diagnosis not present

## 2017-04-12 DIAGNOSIS — M9903 Segmental and somatic dysfunction of lumbar region: Secondary | ICD-10-CM | POA: Diagnosis not present

## 2017-04-12 DIAGNOSIS — M5430 Sciatica, unspecified side: Secondary | ICD-10-CM | POA: Diagnosis not present

## 2017-04-12 DIAGNOSIS — M791 Myalgia, unspecified site: Secondary | ICD-10-CM | POA: Diagnosis not present

## 2017-04-16 DIAGNOSIS — Z72820 Sleep deprivation: Secondary | ICD-10-CM | POA: Diagnosis not present

## 2017-04-16 DIAGNOSIS — M6240 Contracture of muscle, unspecified site: Secondary | ICD-10-CM | POA: Diagnosis not present

## 2017-04-16 DIAGNOSIS — M9905 Segmental and somatic dysfunction of pelvic region: Secondary | ICD-10-CM | POA: Diagnosis not present

## 2017-04-16 DIAGNOSIS — M791 Myalgia, unspecified site: Secondary | ICD-10-CM | POA: Diagnosis not present

## 2017-04-16 DIAGNOSIS — M5430 Sciatica, unspecified side: Secondary | ICD-10-CM | POA: Diagnosis not present

## 2017-04-16 DIAGNOSIS — M9903 Segmental and somatic dysfunction of lumbar region: Secondary | ICD-10-CM | POA: Diagnosis not present

## 2017-04-16 DIAGNOSIS — M9902 Segmental and somatic dysfunction of thoracic region: Secondary | ICD-10-CM | POA: Diagnosis not present

## 2017-04-21 DIAGNOSIS — M6240 Contracture of muscle, unspecified site: Secondary | ICD-10-CM | POA: Diagnosis not present

## 2017-04-21 DIAGNOSIS — M9903 Segmental and somatic dysfunction of lumbar region: Secondary | ICD-10-CM | POA: Diagnosis not present

## 2017-04-21 DIAGNOSIS — M9905 Segmental and somatic dysfunction of pelvic region: Secondary | ICD-10-CM | POA: Diagnosis not present

## 2017-04-21 DIAGNOSIS — Z72820 Sleep deprivation: Secondary | ICD-10-CM | POA: Diagnosis not present

## 2017-04-21 DIAGNOSIS — M791 Myalgia, unspecified site: Secondary | ICD-10-CM | POA: Diagnosis not present

## 2017-04-21 DIAGNOSIS — M5430 Sciatica, unspecified side: Secondary | ICD-10-CM | POA: Diagnosis not present

## 2017-04-21 DIAGNOSIS — M9902 Segmental and somatic dysfunction of thoracic region: Secondary | ICD-10-CM | POA: Diagnosis not present

## 2017-04-26 DIAGNOSIS — M791 Myalgia, unspecified site: Secondary | ICD-10-CM | POA: Diagnosis not present

## 2017-04-26 DIAGNOSIS — M9903 Segmental and somatic dysfunction of lumbar region: Secondary | ICD-10-CM | POA: Diagnosis not present

## 2017-04-26 DIAGNOSIS — M5430 Sciatica, unspecified side: Secondary | ICD-10-CM | POA: Diagnosis not present

## 2017-04-26 DIAGNOSIS — M6240 Contracture of muscle, unspecified site: Secondary | ICD-10-CM | POA: Diagnosis not present

## 2017-04-26 DIAGNOSIS — M9902 Segmental and somatic dysfunction of thoracic region: Secondary | ICD-10-CM | POA: Diagnosis not present

## 2017-04-26 DIAGNOSIS — M9905 Segmental and somatic dysfunction of pelvic region: Secondary | ICD-10-CM | POA: Diagnosis not present

## 2017-04-26 DIAGNOSIS — Z72820 Sleep deprivation: Secondary | ICD-10-CM | POA: Diagnosis not present

## 2017-04-29 ENCOUNTER — Ambulatory Visit: Payer: Self-pay | Admitting: Urology

## 2017-05-11 DIAGNOSIS — I482 Chronic atrial fibrillation: Secondary | ICD-10-CM | POA: Diagnosis not present

## 2017-05-26 DIAGNOSIS — I5022 Chronic systolic (congestive) heart failure: Secondary | ICD-10-CM | POA: Diagnosis not present

## 2017-05-26 DIAGNOSIS — I700 Atherosclerosis of aorta: Secondary | ICD-10-CM | POA: Diagnosis not present

## 2017-05-26 DIAGNOSIS — Z4502 Encounter for adjustment and management of automatic implantable cardiac defibrillator: Secondary | ICD-10-CM | POA: Diagnosis not present

## 2017-05-26 DIAGNOSIS — I447 Left bundle-branch block, unspecified: Secondary | ICD-10-CM | POA: Diagnosis not present

## 2017-05-26 DIAGNOSIS — I6523 Occlusion and stenosis of bilateral carotid arteries: Secondary | ICD-10-CM | POA: Diagnosis not present

## 2017-05-26 DIAGNOSIS — I482 Chronic atrial fibrillation: Secondary | ICD-10-CM | POA: Diagnosis not present

## 2017-08-24 DIAGNOSIS — I480 Paroxysmal atrial fibrillation: Secondary | ICD-10-CM | POA: Diagnosis not present

## 2017-08-25 DIAGNOSIS — M1A00X0 Idiopathic chronic gout, unspecified site, without tophus (tophi): Secondary | ICD-10-CM | POA: Diagnosis not present

## 2017-08-25 DIAGNOSIS — E538 Deficiency of other specified B group vitamins: Secondary | ICD-10-CM | POA: Diagnosis not present

## 2017-08-25 DIAGNOSIS — E1140 Type 2 diabetes mellitus with diabetic neuropathy, unspecified: Secondary | ICD-10-CM | POA: Diagnosis not present

## 2017-09-01 DIAGNOSIS — I5022 Chronic systolic (congestive) heart failure: Secondary | ICD-10-CM | POA: Diagnosis not present

## 2017-09-01 DIAGNOSIS — Z125 Encounter for screening for malignant neoplasm of prostate: Secondary | ICD-10-CM | POA: Diagnosis not present

## 2017-09-01 DIAGNOSIS — I700 Atherosclerosis of aorta: Secondary | ICD-10-CM | POA: Diagnosis not present

## 2017-09-01 DIAGNOSIS — E1151 Type 2 diabetes mellitus with diabetic peripheral angiopathy without gangrene: Secondary | ICD-10-CM | POA: Diagnosis not present

## 2017-09-22 DIAGNOSIS — E119 Type 2 diabetes mellitus without complications: Secondary | ICD-10-CM | POA: Diagnosis not present

## 2017-11-23 DIAGNOSIS — I480 Paroxysmal atrial fibrillation: Secondary | ICD-10-CM | POA: Diagnosis not present

## 2017-12-06 DIAGNOSIS — Z23 Encounter for immunization: Secondary | ICD-10-CM | POA: Diagnosis not present

## 2017-12-22 ENCOUNTER — Emergency Department: Payer: Medicare HMO

## 2017-12-22 ENCOUNTER — Inpatient Hospital Stay
Admission: EM | Admit: 2017-12-22 | Discharge: 2017-12-25 | DRG: 193 | Disposition: A | Discharge status: Home / Self Care | Payer: Medicare HMO | Attending: Internal Medicine | Admitting: Internal Medicine

## 2017-12-22 ENCOUNTER — Other Ambulatory Visit: Payer: Self-pay

## 2017-12-22 DIAGNOSIS — Z95810 Presence of automatic (implantable) cardiac defibrillator: Secondary | ICD-10-CM | POA: Diagnosis not present

## 2017-12-22 DIAGNOSIS — Z7982 Long term (current) use of aspirin: Secondary | ICD-10-CM | POA: Diagnosis not present

## 2017-12-22 DIAGNOSIS — R0602 Shortness of breath: Secondary | ICD-10-CM | POA: Diagnosis not present

## 2017-12-22 DIAGNOSIS — M1990 Unspecified osteoarthritis, unspecified site: Secondary | ICD-10-CM | POA: Diagnosis present

## 2017-12-22 DIAGNOSIS — I959 Hypotension, unspecified: Secondary | ICD-10-CM | POA: Diagnosis present

## 2017-12-22 DIAGNOSIS — M109 Gout, unspecified: Secondary | ICD-10-CM | POA: Diagnosis present

## 2017-12-22 DIAGNOSIS — J029 Acute pharyngitis, unspecified: Secondary | ICD-10-CM | POA: Diagnosis not present

## 2017-12-22 DIAGNOSIS — R05 Cough: Secondary | ICD-10-CM | POA: Diagnosis not present

## 2017-12-22 DIAGNOSIS — N183 Chronic kidney disease, stage 3 (moderate): Secondary | ICD-10-CM | POA: Diagnosis present

## 2017-12-22 DIAGNOSIS — J189 Pneumonia, unspecified organism: Secondary | ICD-10-CM | POA: Diagnosis not present

## 2017-12-22 DIAGNOSIS — A419 Sepsis, unspecified organism: Secondary | ICD-10-CM | POA: Diagnosis not present

## 2017-12-22 DIAGNOSIS — E1122 Type 2 diabetes mellitus with diabetic chronic kidney disease: Secondary | ICD-10-CM | POA: Diagnosis present

## 2017-12-22 DIAGNOSIS — R6889 Other general symptoms and signs: Secondary | ICD-10-CM | POA: Diagnosis not present

## 2017-12-22 DIAGNOSIS — Z87891 Personal history of nicotine dependence: Secondary | ICD-10-CM

## 2017-12-22 DIAGNOSIS — Z79899 Other long term (current) drug therapy: Secondary | ICD-10-CM

## 2017-12-22 DIAGNOSIS — R0902 Hypoxemia: Secondary | ICD-10-CM

## 2017-12-22 DIAGNOSIS — Z8546 Personal history of malignant neoplasm of prostate: Secondary | ICD-10-CM | POA: Diagnosis not present

## 2017-12-22 DIAGNOSIS — J8410 Pulmonary fibrosis, unspecified: Secondary | ICD-10-CM | POA: Diagnosis present

## 2017-12-22 DIAGNOSIS — Z882 Allergy status to sulfonamides status: Secondary | ICD-10-CM | POA: Diagnosis not present

## 2017-12-22 DIAGNOSIS — J9601 Acute respiratory failure with hypoxia: Secondary | ICD-10-CM | POA: Diagnosis not present

## 2017-12-22 DIAGNOSIS — I255 Ischemic cardiomyopathy: Secondary | ICD-10-CM | POA: Diagnosis present

## 2017-12-22 DIAGNOSIS — E785 Hyperlipidemia, unspecified: Secondary | ICD-10-CM | POA: Diagnosis present

## 2017-12-22 DIAGNOSIS — J181 Lobar pneumonia, unspecified organism: Secondary | ICD-10-CM

## 2017-12-22 DIAGNOSIS — E11649 Type 2 diabetes mellitus with hypoglycemia without coma: Secondary | ICD-10-CM | POA: Diagnosis not present

## 2017-12-22 DIAGNOSIS — R0989 Other specified symptoms and signs involving the circulatory and respiratory systems: Secondary | ICD-10-CM | POA: Diagnosis not present

## 2017-12-22 DIAGNOSIS — K219 Gastro-esophageal reflux disease without esophagitis: Secondary | ICD-10-CM | POA: Diagnosis present

## 2017-12-22 DIAGNOSIS — I130 Hypertensive heart and chronic kidney disease with heart failure and stage 1 through stage 4 chronic kidney disease, or unspecified chronic kidney disease: Secondary | ICD-10-CM | POA: Diagnosis present

## 2017-12-22 DIAGNOSIS — I5022 Chronic systolic (congestive) heart failure: Secondary | ICD-10-CM | POA: Diagnosis present

## 2017-12-22 DIAGNOSIS — Z7984 Long term (current) use of oral hypoglycemic drugs: Secondary | ICD-10-CM | POA: Diagnosis not present

## 2017-12-22 DIAGNOSIS — N179 Acute kidney failure, unspecified: Secondary | ICD-10-CM | POA: Diagnosis present

## 2017-12-22 HISTORY — DX: Hyperlipidemia, unspecified: E78.5

## 2017-12-22 LAB — URINALYSIS, COMPLETE (UACMP) WITH MICROSCOPIC
BILIRUBIN URINE: NEGATIVE
Bacteria, UA: NONE SEEN
GLUCOSE, UA: NEGATIVE mg/dL
Hgb urine dipstick: NEGATIVE
Ketones, ur: NEGATIVE mg/dL
LEUKOCYTES UA: NEGATIVE
NITRITE: NEGATIVE
PH: 5 (ref 5.0–8.0)
Protein, ur: NEGATIVE mg/dL
SPECIFIC GRAVITY, URINE: 1.02 (ref 1.005–1.030)

## 2017-12-22 LAB — COMPREHENSIVE METABOLIC PANEL
ALK PHOS: 58 U/L (ref 38–126)
ALT: 15 U/L (ref 0–44)
AST: 19 U/L (ref 15–41)
Albumin: 3.9 g/dL (ref 3.5–5.0)
Anion gap: 7 (ref 5–15)
BUN: 23 mg/dL (ref 8–23)
CO2: 28 mmol/L (ref 22–32)
Calcium: 8.9 mg/dL (ref 8.9–10.3)
Chloride: 101 mmol/L (ref 98–111)
Creatinine, Ser: 1.51 mg/dL — ABNORMAL HIGH (ref 0.61–1.24)
GFR calc non Af Amer: 41 mL/min — ABNORMAL LOW (ref >60)
GFR, EST AFRICAN AMERICAN: 47 mL/min — AB (ref >60)
Glucose, Bld: 145 mg/dL — ABNORMAL HIGH (ref 70–99)
POTASSIUM: 4.2 mmol/L (ref 3.5–5.1)
Sodium: 136 mmol/L (ref 135–145)
Total Bilirubin: 0.9 mg/dL (ref 0.3–1.2)
Total Protein: 6.9 g/dL (ref 6.5–8.1)

## 2017-12-22 LAB — CBC
HEMATOCRIT: 41.6 % (ref 39.0–52.0)
HEMOGLOBIN: 13.6 g/dL (ref 13.0–17.0)
MCH: 34.8 pg — ABNORMAL HIGH (ref 26.0–34.0)
MCHC: 32.7 g/dL (ref 30.0–36.0)
MCV: 106.4 fL — AB (ref 80.0–100.0)
PLATELETS: 171 10*3/uL (ref 150–400)
RBC: 3.91 MIL/uL — ABNORMAL LOW (ref 4.22–5.81)
RDW: 13.3 % (ref 11.5–15.5)
WBC: 11.3 10*3/uL — AB (ref 4.0–10.5)
nRBC: 0 % (ref 0.0–0.2)

## 2017-12-22 LAB — INFLUENZA PANEL BY PCR (TYPE A & B)
Influenza A By PCR: NEGATIVE
Influenza B By PCR: NEGATIVE

## 2017-12-22 LAB — GLUCOSE, CAPILLARY
Glucose-Capillary: 100 mg/dL — ABNORMAL HIGH (ref 70–99)
Glucose-Capillary: 90 mg/dL (ref 70–99)

## 2017-12-22 LAB — PROCALCITONIN: Procalcitonin: <0.1 ng/mL

## 2017-12-22 LAB — HEMOGLOBIN A1C
Hgb A1c MFr Bld: 5.8 % — ABNORMAL HIGH (ref 4.8–5.6)
Mean Plasma Glucose: 119.76 mg/dL

## 2017-12-22 LAB — TROPONIN I: Troponin I: <0.03 ng/mL (ref <0.03)

## 2017-12-22 LAB — CG4 I-STAT (LACTIC ACID): Lactic Acid, Venous: 1.13 mmol/L (ref 0.5–1.9)

## 2017-12-22 MED ORDER — ALLOPURINOL 100 MG PO TABS
100.0000 mg | ORAL_TABLET | Freq: Every day | ORAL | Status: DC
  Administered 2017-12-22 – 2017-12-24 (×3): 100 mg via ORAL
  Filled 2017-12-22 (×3): qty 1

## 2017-12-22 MED ORDER — LEVOFLOXACIN IN D5W 250 MG/50ML IV SOLN
250.0000 mg | INTRAVENOUS | Status: DC
  Administered 2017-12-23 – 2017-12-25 (×3): 250 mg via INTRAVENOUS
  Filled 2017-12-22 (×4): qty 50

## 2017-12-22 MED ORDER — LEVOFLOXACIN IN D5W 500 MG/100ML IV SOLN: 500.0000 mg | INTRAVENOUS | Status: DC

## 2017-12-22 MED ORDER — INSULIN ASPART 100 UNIT/ML ~~LOC~~ SOLN
0.0000 [IU] | Freq: Three times a day (TID) | SUBCUTANEOUS | Status: DC
  Administered 2017-12-23 – 2017-12-25 (×3): 2 [IU] via SUBCUTANEOUS
  Filled 2017-12-22 (×3): qty 1

## 2017-12-22 MED ORDER — ONDANSETRON HCL 4 MG/2ML IJ SOLN: 4.0000 mg | Freq: Four times a day (QID) | INTRAMUSCULAR | Status: DC | PRN

## 2017-12-22 MED ORDER — INSULIN ASPART 100 UNIT/ML ~~LOC~~ SOLN: 0.0000 [IU] | Freq: Every day | SUBCUTANEOUS | Status: DC

## 2017-12-22 MED ORDER — ATORVASTATIN CALCIUM 20 MG PO TABS
40.0000 mg | ORAL_TABLET | Freq: Every day | ORAL | Status: DC
  Administered 2017-12-22 – 2017-12-25 (×4): 40 mg via ORAL
  Filled 2017-12-22 (×4): qty 2

## 2017-12-22 MED ORDER — ACETAMINOPHEN 500 MG PO TABS
1000.0000 mg | ORAL_TABLET | Freq: Once | ORAL | Status: AC
  Administered 2017-12-22: 1000 mg via ORAL

## 2017-12-22 MED ORDER — ACETAMINOPHEN 500 MG PO TABS
ORAL_TABLET | ORAL | Status: AC
  Filled 2017-12-22: qty 2

## 2017-12-22 MED ORDER — ASPIRIN EC 325 MG PO TBEC
325.0000 mg | DELAYED_RELEASE_TABLET | Freq: Every day | ORAL | Status: DC
  Administered 2017-12-23 – 2017-12-25 (×3): 325 mg via ORAL
  Filled 2017-12-22 (×3): qty 1

## 2017-12-22 MED ORDER — GUAIFENESIN 100 MG/5ML PO SOLN
5.0000 mL | ORAL | Status: DC | PRN
  Administered 2017-12-23: 100 mg via ORAL
  Filled 2017-12-22 (×2): qty 5

## 2017-12-22 MED ORDER — ENOXAPARIN SODIUM 40 MG/0.4ML ~~LOC~~ SOLN
40.0000 mg | SUBCUTANEOUS | Status: DC
  Administered 2017-12-22 – 2017-12-24 (×3): 40 mg via SUBCUTANEOUS
  Filled 2017-12-22 (×3): qty 0.4

## 2017-12-22 MED ORDER — ACETAMINOPHEN 325 MG PO TABS
650.0000 mg | ORAL_TABLET | Freq: Four times a day (QID) | ORAL | Status: DC | PRN
  Administered 2017-12-24 – 2017-12-25 (×2): 650 mg via ORAL
  Filled 2017-12-22 (×2): qty 2

## 2017-12-22 MED ORDER — LEVOFLOXACIN IN D5W 750 MG/150ML IV SOLN
750.0000 mg | Freq: Once | INTRAVENOUS | Status: AC
  Administered 2017-12-22: 750 mg via INTRAVENOUS
  Filled 2017-12-22: qty 150

## 2017-12-22 MED ORDER — SODIUM CHLORIDE 0.9 % IV BOLUS
1000.0000 mL | Freq: Once | INTRAVENOUS | Status: AC
  Administered 2017-12-22: 1000 mL via INTRAVENOUS

## 2017-12-22 MED ORDER — LIDOCAINE VISCOUS HCL 2 % MT SOLN
15.0000 mL | Freq: Once | OROMUCOSAL | Status: AC
  Administered 2017-12-22: 15 mL via OROMUCOSAL
  Filled 2017-12-22: qty 15

## 2017-12-22 MED ORDER — ONDANSETRON HCL 4 MG PO TABS: 4.0000 mg | ORAL_TABLET | Freq: Four times a day (QID) | ORAL | Status: DC | PRN

## 2017-12-22 MED ORDER — ACETAMINOPHEN 650 MG RE SUPP: 650.0000 mg | Freq: Four times a day (QID) | RECTAL | Status: DC | PRN

## 2017-12-22 MED ORDER — PHENOL 1.4 % MT LIQD
1.0000 | OROMUCOSAL | Status: DC | PRN
  Administered 2017-12-22: 1 via OROMUCOSAL
  Filled 2017-12-22: qty 177

## 2017-12-22 MED ORDER — CARVEDILOL 6.25 MG PO TABS
3.1250 mg | ORAL_TABLET | Freq: Two times a day (BID) | ORAL | Status: DC
  Administered 2017-12-22 – 2017-12-24 (×4): 3.125 mg via ORAL
  Filled 2017-12-22 (×6): qty 1

## 2017-12-22 MED ORDER — BUDESONIDE 0.5 MG/2ML IN SUSP
0.5000 mg | Freq: Two times a day (BID) | RESPIRATORY_TRACT | Status: DC
  Administered 2017-12-22 – 2017-12-25 (×6): 0.5 mg via RESPIRATORY_TRACT
  Filled 2017-12-22 (×7): qty 2

## 2017-12-22 MED ORDER — IOHEXOL 350 MG/ML SOLN
60.0000 mL | Freq: Once | INTRAVENOUS | Status: AC | PRN
  Administered 2017-12-22: 14:00:00 via INTRAVENOUS

## 2017-12-22 MED ORDER — SODIUM CHLORIDE 0.9 % IV SOLN
INTRAVENOUS | Status: DC
  Administered 2017-12-22: 17:00:00 via INTRAVENOUS

## 2017-12-22 MED ORDER — PANTOPRAZOLE SODIUM 40 MG PO TBEC: 40.0000 mg | DELAYED_RELEASE_TABLET | Freq: Every day | ORAL | Status: DC | PRN

## 2017-12-22 MED ORDER — IPRATROPIUM-ALBUTEROL 0.5-2.5 (3) MG/3ML IN SOLN
3.0000 mL | Freq: Four times a day (QID) | RESPIRATORY_TRACT | Status: DC
  Administered 2017-12-22 – 2017-12-24 (×8): 3 mL via RESPIRATORY_TRACT
  Filled 2017-12-22 (×8): qty 3

## 2017-12-22 NOTE — Progress Notes (Signed)
MD messaged: [id number]/Baquera- Would you like to add a diet to the patient's order.

## 2017-12-22 NOTE — ED Triage Notes (Addendum)
Pt comes via POV from home with c/o fever and earlier had N/V. Pt states runny nose and some congestion. Pt states this all started this am.  Pt states he took zpack today that was just prescribed for him yesterday.  Pt denies any SHOB.  Family states pt was at [facility] and brought over here because his O2 was low. They put him on 2L. Pt also had chest xray, strep and flu test performed and negative results.  Pt currently at 93% room air.

## 2017-12-22 NOTE — Progress Notes (Signed)
Patient ID: Thomas Morton, male   DOB: 1927-11-23, 84 y.o.   MRN: [id number]  ACP note  Patient and son at the bedside  Diagnosis: Acute hypoxic respiratory failure, clinical sepsis with pneumonia, fibrosis seen on CT scan, chronic systolic congestive heart failure, acute kidney injury on chronic kidney disease stage III, relative hypotension, type 2 diabetes  CODE STATUS discussed and patient is a full code  Plan.  Oxygen supplementation, IV antibiotics, nebulizer treatments and monitor closely.  Time spent on ACP discussion 17 minutes Dr. Loletha Grayer

## 2017-12-22 NOTE — ED Notes (Signed)
Patient transported to CT 

## 2017-12-22 NOTE — ED Provider Notes (Signed)
[facility] EMERGENCY DEPARTMENT Provider Note   CSN: [id number] Arrival date & time: 12/22/17  1150     History   Chief Complaint Chief Complaint  Patient presents with  . Fever    HPI Thomas Morton is a 84 y.o. male hx of CHF, DM, prostate cancer here with fever, shortness of breath, nausea.  Patient has been having some cough and shortness of breath since yesterday.  Patient was prescribed a Z-Pak over the phone by primary care doctor.  He had a follow-up with [facility] clinic this morning.  He had a negative rapid strep as well as flu and the chest x-ray that was unremarkable.  Was noted to be hypoxic in clinic (87 % walking, 90-91% on RA per clinic note) so was sent here for evaluation.  Patient states that he is not on oxygen at baseline.  Patient was noted to be febrile and hypotensive in the ED.   The history is provided by the patient.    Past Medical History:  Diagnosis Date  . Cancer [facility])    prostate 2001  . CHF (congestive heart failure) ([place])   . Diabetes mellitus without complication [facility])     Patient Active Problem List   Diagnosis Date Noted  . Dizziness and giddiness 10/15/2015  . CVA (cerebral vascular accident) ([place]) 10/14/2015  . Numbness 10/14/2015  . Chronic renal insufficiency 10/14/2015  . Essential hypertension 10/14/2015    History reviewed. No pertinent surgical history.      Home Medications    Prior to Admission medications   Medication Sig Start Date End Date Taking? Authorizing Provider  allopurinol (ZYLOPRIM) 300 MG tablet Take 1 tablet by mouth at bedtime. 02/13/15  Yes [provider]  aspirin EC 325 MG EC tablet Take 1 tablet (325 mg total) by mouth daily. 10/16/15  Yes Theodoro Grist, MD  atorvastatin (LIPITOR) 40 MG tablet Take 1 tablet (40 mg total) by mouth daily at 6 PM. 10/15/15  Yes Theodoro Grist, MD  betamethasone dipropionate (DIPROLENE) 0.05 % cream Apply 1 application topically 2 (two)  times daily as needed. 02/08/14  Yes [provider]  carvedilol (COREG) 3.125 MG tablet Take 1 tablet by mouth 2 (two) times daily.  02/13/15  Yes [provider]  etodolac (LODINE) 400 MG tablet Take 400 mg by mouth daily as needed. 02/13/15  Yes [provider]  glimepiride (AMARYL) 2 MG tablet Take 1 tablet by mouth at bedtime.   Yes [provider]  indapamide (LOZOL) 1.25 MG tablet 1 tablet daily. 12/24/14  Yes [provider]  pantoprazole (PROTONIX) 40 MG tablet Take 1 tablet by mouth as needed. 02/13/15  Yes [provider]  triamcinolone (KENALOG) 0.025 % cream Apply 1 application topically as needed. 11/24/13  Yes [provider]  metFORMIN (GLUCOPHAGE) 1000 MG tablet Take 1 tablet by mouth at bedtime. 02/13/15   [provider]    Family History No family history on file.  Social History Social History   Tobacco Use  . Smoking status: Former Smoker    Years: 30.00    Types: Cigarettes  . Smokeless tobacco: Never Used  Substance Use Topics  . Alcohol use: Not on file  . Drug use: Not on file     Allergies   Sulfa antibiotics   Review of Systems Review of Systems  Constitutional: Positive for fever.  Respiratory: Positive for cough and shortness of breath.   All other systems reviewed and are negative.  Physical Exam Updated Vital Signs BP (!) 114/54   Pulse 96   Temp 99.1 F (37.3 C) (Oral)   Resp 18   Ht 5\' 10"  (1.778 m)   Wt 89.4 kg   SpO2 99%   BMI 28.27 kg/m   Physical Exam  Constitutional: He is oriented to person, place, and time.  Chronically ill   HENT:  Head: Normocephalic.  MM slightly dry, OP clear   Eyes: Pupils are equal, round, and reactive to light. Conjunctivae and EOM are normal.  Neck: Normal range of motion. Neck supple.  Cardiovascular: Normal rate, regular rhythm and normal heart sounds.  Pulmonary/Chest:  Crackles L base   Abdominal: Soft. Bowel sounds are  normal. He exhibits no distension. There is no tenderness.  Musculoskeletal: Normal range of motion.  Neurological: He is alert and oriented to person, place, and time. No cranial nerve deficit. Coordination normal.  Skin: Skin is warm. Capillary refill takes less than 2 seconds.  Psychiatric: He has a normal mood and affect.  Nursing note and vitals reviewed.    ED Treatments / Results  Labs (all labs ordered are listed, but only abnormal results are displayed) Labs Reviewed  CBC - Abnormal; Notable for the following components:      Result Value   WBC 11.3 (*)    RBC 3.91 (*)    MCV 106.4 (*)    MCH 34.8 (*)    All other components within normal limits  COMPREHENSIVE METABOLIC PANEL - Abnormal; Notable for the following components:   Glucose, Bld 145 (*)    Creatinine, Ser 1.51 (*)    GFR calc non Af Amer 41 (*)    GFR calc Af Amer 47 (*)    All other components within normal limits  URINALYSIS, COMPLETE (UACMP) WITH MICROSCOPIC - Abnormal; Notable for the following components:   Color, Urine YELLOW (*)    APPearance CLEAR (*)    All other components within normal limits  CULTURE, BLOOD (ROUTINE X 2)  CULTURE, BLOOD (ROUTINE X 2)  URINE CULTURE  TROPONIN I  I-STAT CG4 LACTIC ACID, ED  CG4 I-STAT (LACTIC ACID)    EKG None  Radiology Ct Angio Chest Pe W And/or Wo Contrast  Result Date: 12/22/2017 CLINICAL DATA:  Fever and congestion. Decreased oxygen saturation. Evaluate for pulmonary embolism. EXAM: CT ANGIOGRAPHY CHEST WITH CONTRAST TECHNIQUE: Multidetector CT imaging of the chest was performed using the standard protocol during bolus administration of intravenous contrast. Multiplanar CT image reconstructions and MIPs were obtained to evaluate the vascular anatomy. CONTRAST:  60 cc Omnipaque 350 COMPARISON:  Chest radiograph-10/14/2015 FINDINGS: Examination degraded secondary to patient respiratory artifact. Vascular Findings: There is adequate opacification of the  pulmonary arterial system with the main pulmonary artery measuring 314 Hounsfield units. No discrete filling defects within the pulmonary arterial tree to suggest pulmonary embolism. Normal caliber of the main pulmonary artery. Cardiomegaly. Left anterior chest wall 3 lead AICD/pacemaker. Coronary artery calcifications. Normal caliber the thoracic aorta. Conventional configuration of the aortic arch. The branch vessels of the aortic arch appear patent throughout their imaged course. Minimal amount of mixed calcified and noncalcified atherosclerotic plaque within a tortuous but normal caliber descending thoracic aorta. Descending thoracic aorta is mildly tortuous but of normal caliber. Review of the MIP images confirms the above findings. ---------------------------------------------------------------------------------- Nonvascular Findings: Mediastinum/Lymph Nodes: No bulky mediastinal, hilar axillary lymphadenopathy. Lungs/Pleura: Rather extensive subpleural reticular opacities with subpleural reticular opacities, most conspicuous within the lung apices and subpleural dependent portions of  the bilateral lower lobes, right greater than left, without discrete area of honeycombing on this motion degraded examination. No discrete focal airspace opacities. No pleural effusion or pneumothorax. There is mild short-segment bronchial occlusion involving the antral medial basilar segmental bronchus of the left lower lobe (image 49, series 7) and mild diffuse bronchial wall thickening primarily involving the bilateral lower lobe segmental and subsegmental bronchi most conspicuous within the left lower lobe. The remaining central pulmonary airways appear patent. No air bronchograms. Punctate (approximately 4 mm) calcified granuloma within the left lower lobe (image 47, series 7). Otherwise, no discrete pulmonary nodules given limitation of the examination. Upper abdomen: Limited early arterial phase evaluation of the upper  abdomen is unremarkable. Musculoskeletal: No acute or aggressive osseous abnormalities. Stigmata of DISH throughout the thoracic spine. Old/healed fracture involving the posterior aspect of the right ninth rib (image 71, series 5). Note is made of mild grossly symmetric bilateral gynecomastia. IMPRESSION: 1. Suspected mild airways disease, most conspicuously involving the left lower lobe without focal airspace opacities to suggest pneumonia. 2. Otherwise, no acute cardiopulmonary disease. Specifically, no evidence of pulmonary embolism. 3. Rather extensive subpleural reticular opacities suggestive of pulmonary fibrosis but without definitive evidence of honeycombing to suggest UIP. If not previously performed, this patient could benefit from nonemergent pulmonary medicine consult and/or the acquisition of an ILD protocol chest CT. 4. Cardiomegaly. Left anterior chest wall 3 lead AICD/pacemaker placement. 5. Coronary artery calcifications. Aortic Atherosclerosis (ICD10-I70.0). Electronically Signed   By: Sandi Mariscal M.D.   On: 12/22/2017 14:01    Procedures Procedures (including critical care time)  CRITICAL CARE Performed by: Wandra Arthurs   Total critical care time: 30 minutes  Critical care time was exclusive of separately billable procedures and treating other patients.  Critical care was necessary to treat or prevent imminent or life-threatening deterioration.  Critical care was time spent personally by me on the following activities: development of treatment plan with patient and/or surrogate as well as nursing, discussions with consultants, evaluation of patient's response to treatment, examination of patient, obtaining history from patient or surrogate, ordering and performing treatments and interventions, ordering and review of laboratory studies, ordering and review of radiographic studies, pulse oximetry and re-evaluation of patient's condition.   Medications Ordered in ED Medications    acetaminophen (TYLENOL) 500 MG tablet (has no administration in time range)  levofloxacin (LEVAQUIN) IVPB 750 mg (750 mg Intravenous New Bag/Given 12/22/17 1408)  acetaminophen (TYLENOL) tablet 1,000 mg (1,000 mg Oral Given 12/22/17 1221)  sodium chloride 0.9 % bolus 1,000 mL (1,000 mLs Intravenous Bolus 12/22/17 1401)  iohexol (OMNIPAQUE) 350 MG/ML injection 60 mL ( Intravenous Contrast Given 12/22/17 1337)  lidocaine (XYLOCAINE) 2 % viscous mouth solution 15 mL (15 mLs Mouth/Throat Given 12/22/17 1408)     Initial Impression / Assessment and Plan / ED Course  I have reviewed the triage vital signs and the nursing notes.  Pertinent labs & imaging results that were available during my care of the patient were reviewed by me and considered in my medical decision making (see chart for details).    ODYN TURKO is a 84 y.o. male here with SOB, cough, hypoxia, fever. Flu negative, rapid strep neg, CXR normal prior to arrival. Clinically has pneumonia and he is hypotensive. I am concerned that he is septic from pneumonia. Will get labs, lactate, blood cultures, CT chest. Will give IVF, levaquin empirically (he is already on zpack).   2:32 PM Doing well on 2 L [place].  CTA showed pulmonary fibrosis, airway disease, no PE. I think likely has pneumonia clinically. WBC 11. Temp down to 99, BP improved with IVF. Will admit for pneumonia with hypoxia, workup for pulmonary fibrosis.    Final Clinical Impressions(s) / ED Diagnoses   Final diagnoses:  None    ED Discharge Orders    None       Drenda Freeze, MD 12/22/17 1433

## 2017-12-22 NOTE — H&P (Signed)
[facility] NAME: Thomas Morton    MR#:  [id number]  DATE OF BIRTH:  1927/12/14  DATE OF ADMISSION:  12/22/2017  PRIMARY CARE PHYSICIAN: Rusty Aus, MD   REQUESTING/REFERRING PHYSICIAN: Dr Shirlyn Goltz  CHIEF COMPLAINT:   Chief Complaint  Patient presents with  . Fever    HISTORY OF PRESENT ILLNESS:  Thomas Morton  is a 84 y.o. male sent over from primary care physicians for hypoxia with a pulse ox of 87% on room air.  He stated he had a bad cold Wednesday got a Z-Pak started last night his throat was sore and can hardly swallow.  He has had a raspy voice.  He has had a fever.  Coughing up greenish phlegm.  Has had a runny nose.  He went to see his primary care physician found to have a oxygen level that was low and sent over to the ER for further evaluation.  In the ER he had a CT scan of the chest that showed airway disease and also fibrosis.  Hospitalist services were contacted for further evaluation.  PAST MEDICAL HISTORY:   Past Medical History:  Diagnosis Date  . Cancer [facility])    prostate 2001  . CHF (congestive heart failure) ([place])   . Diabetes mellitus without complication ([place])   . Hyperlipidemia     PAST SURGICAL HISTORY:   Past Surgical History:  Procedure Laterality Date  . CARDIAC DEFIBRILLATOR PLACEMENT    . eyelid surgery    . PROSTATECTOMY      SOCIAL HISTORY:   Social History   Tobacco Use  . Smoking status: Former Smoker    Years: 30.00    Types: Cigarettes  . Smokeless tobacco: Never Used  Substance Use Topics  . Alcohol use: Not on file    FAMILY HISTORY:   Family History  Problem Relation Age of Onset  . Throat cancer Mother   . Diabetes Father     DRUG ALLERGIES:   Allergies  Allergen Reactions  . Sulfa Antibiotics Rash    Per patient low doses do not cause issues, only higher doses    REVIEW OF SYSTEMS:  CONSTITUTIONAL: Positive fever chills.  Positive for  fatigue.  EYES: No blurred or double vision.  Wears glasses.  Has an issue with his right eyelid. EARS, NOSE, AND THROAT: No tinnitus or ear pain.  Positive for runny nose and sore throat RESPIRATORY: Positive for cough, shortness of breath, and wheezing.no hemoptysis.  CARDIOVASCULAR: No chest pain, orthopnea, edema.  GASTROINTESTINAL: Slight nausea.  No vomiting, diarrhea or abdominal pain. No blood in bowel movements GENITOURINARY: No dysuria, hematuria.  ENDOCRINE: No polyuria, nocturia,  HEMATOLOGY: No anemia, easy bruising or bleeding SKIN: No rash or lesion. MUSCULOSKELETAL: Positive for joint pains NEUROLOGIC: No tingling, numbness, weakness.  PSYCHIATRY: No anxiety or depression.   MEDICATIONS AT HOME:   Prior to Admission medications   Medication Sig Start Date End Date Taking? Authorizing Provider  allopurinol (ZYLOPRIM) 300 MG tablet Take 1 tablet by mouth at bedtime. 02/13/15  Yes [provider]  aspirin EC 325 MG EC tablet Take 1 tablet (325 mg total) by mouth daily. 10/16/15  Yes Theodoro Grist, MD  atorvastatin (LIPITOR) 40 MG tablet Take 1 tablet (40 mg total) by mouth daily at 6 PM. 10/15/15  Yes Theodoro Grist, MD  betamethasone dipropionate (DIPROLENE) 0.05 % cream Apply 1 application topically 2 (two) times daily as needed. 02/08/14  Yes  [provider]  carvedilol (COREG) 3.125 MG tablet Take 1 tablet by mouth 2 (two) times daily.  02/13/15  Yes [provider]  etodolac (LODINE) 400 MG tablet Take 400 mg by mouth daily as needed. 02/13/15  Yes [provider]  glimepiride (AMARYL) 2 MG tablet Take 1 tablet by mouth at bedtime.   Yes [provider]  indapamide (LOZOL) 1.25 MG tablet 1 tablet daily. 12/24/14  Yes [provider]  pantoprazole (PROTONIX) 40 MG tablet Take 1 tablet by mouth as needed. 02/13/15  Yes [provider]  triamcinolone (KENALOG) 0.025 % cream Apply 1 application topically as needed. 11/24/13   Yes [provider]  metFORMIN (GLUCOPHAGE) 1000 MG tablet Take 1 tablet by mouth at bedtime. 02/13/15   [provider]      VITAL SIGNS:  Blood pressure (!) 114/54, pulse 96, temperature 99.1 F (37.3 C), temperature source Oral, resp. rate 18, height 5\' 10"  (1.778 m), weight 89.4 kg, SpO2 99 %.  PHYSICAL EXAMINATION:  GENERAL:  84 y.o.-year-old patient lying in the bed with no acute distress.  EYES: Pupils equal, round, reactive to light and accommodation. No scleral icterus. Extraocular muscles intact.  HEENT: Head atraumatic, normocephalic. Oropharynx and nasopharynx clear.  NECK:  Supple, no jugular venous distention. No thyroid enlargement, no tenderness.  LUNGS: Decreased breath sounds bilateral, upper airway slight expiratory wheezing, positive rhonchi bilateral lower lung fields. No use of accessory muscles of respiration.  CARDIOVASCULAR: S1, S2 normal. No murmurs, rubs, or gallops.  ABDOMEN: Soft, nontender, nondistended. Bowel sounds present. No organomegaly or mass.  EXTREMITIES: No pedal edema, cyanosis.  Positive for clubbing.  NEUROLOGIC: Cranial nerves II through XII are intact. Muscle strength 5/5 in all extremities. Sensation intact. Gait not checked.  PSYCHIATRIC: The patient is alert and oriented x 3.  SKIN: No rash, lesion, or ulcer.   LABORATORY PANEL:   CBC Recent Labs  Lab 12/22/17 1214  WBC 11.3*  HGB 13.6  HCT 41.6  PLT 171   ------------------------------------------------------------------------------------------------------------------  Chemistries  Recent Labs  Lab 12/22/17 1214  NA 136  K 4.2  CL 101  CO2 28  GLUCOSE 145*  BUN 23  CREATININE 1.51*  CALCIUM 8.9  AST 19  ALT 15  ALKPHOS 58  BILITOT 0.9   ------------------------------------------------------------------------------------------------------------------    RADIOLOGY:  Ct Angio Chest Pe W And/or Wo Contrast  Result Date: 12/22/2017 CLINICAL DATA:   Fever and congestion. Decreased oxygen saturation. Evaluate for pulmonary embolism. EXAM: CT ANGIOGRAPHY CHEST WITH CONTRAST TECHNIQUE: Multidetector CT imaging of the chest was performed using the standard protocol during bolus administration of intravenous contrast. Multiplanar CT image reconstructions and MIPs were obtained to evaluate the vascular anatomy. CONTRAST:  60 cc Omnipaque 350 COMPARISON:  Chest radiograph-10/14/2015 FINDINGS: Examination degraded secondary to patient respiratory artifact. Vascular Findings: There is adequate opacification of the pulmonary arterial system with the main pulmonary artery measuring 314 Hounsfield units. No discrete filling defects within the pulmonary arterial tree to suggest pulmonary embolism. Normal caliber of the main pulmonary artery. Cardiomegaly. Left anterior chest wall 3 lead AICD/pacemaker. Coronary artery calcifications. Normal caliber the thoracic aorta. Conventional configuration of the aortic arch. The branch vessels of the aortic arch appear patent throughout their imaged course. Minimal amount of mixed calcified and noncalcified atherosclerotic plaque within a tortuous but normal caliber descending thoracic aorta. Descending thoracic aorta is mildly tortuous but of normal caliber. Review of the MIP images confirms the above findings. ---------------------------------------------------------------------------------- Nonvascular Findings: Mediastinum/Lymph Nodes:  No bulky mediastinal, hilar axillary lymphadenopathy. Lungs/Pleura: Rather extensive subpleural reticular opacities with subpleural reticular opacities, most conspicuous within the lung apices and subpleural dependent portions of the bilateral lower lobes, right greater than left, without discrete area of honeycombing on this motion degraded examination. No discrete focal airspace opacities. No pleural effusion or pneumothorax. There is mild short-segment bronchial occlusion involving the antral  medial basilar segmental bronchus of the left lower lobe (image 49, series 7) and mild diffuse bronchial wall thickening primarily involving the bilateral lower lobe segmental and subsegmental bronchi most conspicuous within the left lower lobe. The remaining central pulmonary airways appear patent. No air bronchograms. Punctate (approximately 4 mm) calcified granuloma within the left lower lobe (image 47, series 7). Otherwise, no discrete pulmonary nodules given limitation of the examination. Upper abdomen: Limited early arterial phase evaluation of the upper abdomen is unremarkable. Musculoskeletal: No acute or aggressive osseous abnormalities. Stigmata of DISH throughout the thoracic spine. Old/healed fracture involving the posterior aspect of the right ninth rib (image 71, series 5). Note is made of mild grossly symmetric bilateral gynecomastia. IMPRESSION: 1. Suspected mild airways disease, most conspicuously involving the left lower lobe without focal airspace opacities to suggest pneumonia. 2. Otherwise, no acute cardiopulmonary disease. Specifically, no evidence of pulmonary embolism. 3. Rather extensive subpleural reticular opacities suggestive of pulmonary fibrosis but without definitive evidence of honeycombing to suggest UIP. If not previously performed, this patient could benefit from nonemergent pulmonary medicine consult and/or the acquisition of an ILD protocol chest CT. 4. Cardiomegaly. Left anterior chest wall 3 lead AICD/pacemaker placement. 5. Coronary artery calcifications. Aortic Atherosclerosis (ICD10-I70.0). Electronically Signed   By: Sandi Mariscal M.D.   On: 12/22/2017 14:01    EKG:   Ordered by me  IMPRESSION AND PLAN:   1.  Acute hypoxic respiratory failure.  Oxygen supplementation.  Need to get better air entry and hopefully can get off the oxygen. 2.  Clinical sepsis with pneumonia seen on CT scan.  Will send off a procalcitonin.  Empiric Levaquin.  Sputum culture.  Send off  influenza and respiratory panel. 3.  Fibrosis seen on CT scan.  Patient has no history of lung disease that he knows of.  I do see clubbing. 4.  Chronic systolic congestive heart failure.  No signs of heart failure currently.  Watch with gentle IV fluid hydration. 5.  Acute kidney injury on chronic kidney disease stage III.  Watch with gentle IV fluid hydration hold indapamide 6.  Relative hypotension gentle IV fluids hold indapamide and holding parameters on Coreg. 7.  Type 2 diabetes mellitus hold oral medications and put on sliding scale.     All the records are reviewed and case discussed with ED provider. Management plans discussed with the patient, family and they are in agreement.  CODE STATUS: full code  TOTAL TIME TAKING CARE OF THIS PATIENT: 50 minutes, including acp time.    Loletha Grayer M.D on 12/22/2017 at 3:06 PM  Between 7am to 6pm - Pager - [phone]  After 6pm call admission pager [phone]  [facility] Office  [phone]  CC: Primary care physician; Rusty Aus, MD

## 2017-12-23 LAB — BASIC METABOLIC PANEL
Anion gap: 6 (ref 5–15)
BUN: 25 mg/dL — ABNORMAL HIGH (ref 8–23)
CO2: 29 mmol/L (ref 22–32)
Calcium: 8.4 mg/dL — ABNORMAL LOW (ref 8.9–10.3)
Chloride: 102 mmol/L (ref 98–111)
Creatinine, Ser: 1.47 mg/dL — ABNORMAL HIGH (ref 0.61–1.24)
GFR calc Af Amer: 49 mL/min — ABNORMAL LOW (ref >60)
GFR calc non Af Amer: 42 mL/min — ABNORMAL LOW (ref >60)
Glucose, Bld: 125 mg/dL — ABNORMAL HIGH (ref 70–99)
Potassium: 4.1 mmol/L (ref 3.5–5.1)
Sodium: 137 mmol/L (ref 135–145)

## 2017-12-23 LAB — GLUCOSE, CAPILLARY
Glucose-Capillary: 117 mg/dL — ABNORMAL HIGH (ref 70–99)
Glucose-Capillary: 172 mg/dL — ABNORMAL HIGH (ref 70–99)
Glucose-Capillary: 159 mg/dL — ABNORMAL HIGH (ref 70–99)
Glucose-Capillary: 99 mg/dL (ref 70–99)

## 2017-12-23 LAB — RESPIRATORY PANEL BY PCR
Adenovirus: NOT DETECTED
Bordetella pertussis: NOT DETECTED
CHLAMYDOPHILA PNEUMONIAE-RVPPCR: NOT DETECTED
CORONAVIRUS 229E-RVPPCR: NOT DETECTED
CORONAVIRUS OC43-RVPPCR: NOT DETECTED
Coronavirus HKU1: NOT DETECTED
Coronavirus NL63: NOT DETECTED
INFLUENZA B-RVPPCR: NOT DETECTED
Influenza A: NOT DETECTED
METAPNEUMOVIRUS-RVPPCR: NOT DETECTED
Mycoplasma pneumoniae: NOT DETECTED
PARAINFLUENZA VIRUS 2-RVPPCR: NOT DETECTED
PARAINFLUENZA VIRUS 3-RVPPCR: NOT DETECTED
PARAINFLUENZA VIRUS 4-RVPPCR: NOT DETECTED
Parainfluenza Virus 1: NOT DETECTED
RESPIRATORY SYNCYTIAL VIRUS-RVPPCR: NOT DETECTED
Rhinovirus / Enterovirus: DETECTED — AB

## 2017-12-23 LAB — CBC
HCT: 37 % — ABNORMAL LOW (ref 39.0–52.0)
Hemoglobin: 12 g/dL — ABNORMAL LOW (ref 13.0–17.0)
MCH: 34.8 pg — ABNORMAL HIGH (ref 26.0–34.0)
MCHC: 32.4 g/dL (ref 30.0–36.0)
MCV: 107.2 fL — ABNORMAL HIGH (ref 80.0–100.0)
Platelets: 149 10*3/uL — ABNORMAL LOW (ref 150–400)
RBC: 3.45 MIL/uL — ABNORMAL LOW (ref 4.22–5.81)
RDW: 13.3 % (ref 11.5–15.5)
WBC: 9 10*3/uL (ref 4.0–10.5)
nRBC: 0 % (ref 0.0–0.2)

## 2017-12-23 LAB — PROCALCITONIN: Procalcitonin: <0.1 ng/mL

## 2017-12-23 LAB — URINE CULTURE: Culture: NO GROWTH

## 2017-12-23 MED ORDER — GLIMEPIRIDE 2 MG PO TABS
2.0000 mg | ORAL_TABLET | Freq: Every day | ORAL | Status: DC
  Administered 2017-12-23: 2 mg via ORAL
  Filled 2017-12-23 (×3): qty 1

## 2017-12-23 MED ORDER — MENTHOL 3 MG MT LOZG
1.0000 | LOZENGE | OROMUCOSAL | Status: DC | PRN
  Administered 2017-12-23 (×2): 3 mg via ORAL
  Filled 2017-12-23: qty 9

## 2017-12-23 NOTE — Progress Notes (Addendum)
[facility] [place] NAME: Thomas Morton    MR#:  [id number]  DATE OF BIRTH:  [birth year]  SUBJECTIVE: Patient does have some cough, slight tachycardia today.  He wants to go home.  Still requiring some oxygen, on 3 L of oxygen now.  CHIEF COMPLAINT:   Chief Complaint  Patient presents with  . Fever  Says he is feeling little better than yesterday.  REVIEW OF SYSTEMS:   ROS CONSTITUTIONAL: No fever, fatigue or weakness.  EYES: No blurred or double vision.  EARS, NOSE, AND THROAT: No tinnitus or ear pain.  RESPIRATORY: Has some cough but no shortness of breath.  CARDIOVASCULAR: No chest pain, orthopnea, edema.  GASTROINTESTINAL: No nausea, vomiting, diarrhea or abdominal pain.  GENITOURINARY: No dysuria, hematuria.  ENDOCRINE: No polyuria, nocturia,  HEMATOLOGY: No anemia, easy bruising or bleeding SKIN: No rash or lesion. MUSCULOSKELETAL: No joint pain or arthritis.   NEUROLOGIC: No tingling, numbness, weakness.  PSYCHIATRY: No anxiety or depression.   DRUG ALLERGIES:   Allergies  Allergen Reactions  . Sulfa Antibiotics Rash    Per patient low doses do not cause issues, only higher doses    VITALS:  Blood pressure 126/64, pulse (!) 115, temperature 97.7 F (36.5 C), temperature source Oral, resp. rate 18, height 5\' 10"  (1.778 m), weight 89.4 kg, SpO2 96 %.  PHYSICAL EXAMINATION:  GENERAL:  84 y.o.-year-old patient lying in the bed with no acute distress.  EYES: Pupils equal, round, reactive to light and accommodation. No scleral icterus. Extraocular muscles intact.  HEENT: Head atraumatic, normocephalic. Oropharynx and nasopharynx clear.  NECK:  Supple, no jugular venous distention. No thyroid enlargement, no tenderness.  LUNGS:  diminished air entry bilaterally.  CARDIOVASCULAR: S1, S2  No murmurs, rubs, or gallops.  ABDOMEN: Soft, nontender, nondistended. Bowel sounds present. No organomegaly or mass.  EXTREMITIES:  No pedal edema, cyanosis, or clubbing.  NEUROLOGIC: Cranial nerves II through XII are intact. Muscle strength 5/5 in all extremities. Sensation intact. Gait not checked.  PSYCHIATRIC: The patient is alert and oriented x 3.  SKIN: No obvious rash, lesion, or ulcer.    LABORATORY PANEL:   CBC Recent Labs  Lab 12/23/17 0520  WBC 9.0  HGB 12.0*  HCT 37.0*  PLT 149*   ------------------------------------------------------------------------------------------------------------------  Chemistries  Recent Labs  Lab 12/22/17 1214 12/23/17 0520  NA 136 137  K 4.2 4.1  CL 101 102  CO2 28 29  GLUCOSE 145* 125*  BUN 23 25*  CREATININE 1.51* 1.47*  CALCIUM 8.9 8.4*  AST 19  --   ALT 15  --   ALKPHOS 58  --   BILITOT 0.9  --    ------------------------------------------------------------------------------------------------------------------  Cardiac Enzymes Recent Labs  Lab 12/22/17 1214  TROPONINI <0.03   ------------------------------------------------------------------------------------------------------------------  RADIOLOGY:  Ct Angio Chest Pe W And/or Wo Contrast  Result Date: 12/22/2017 CLINICAL DATA:  Fever and congestion. Decreased oxygen saturation. Evaluate for pulmonary embolism. EXAM: CT ANGIOGRAPHY CHEST WITH CONTRAST TECHNIQUE: Multidetector CT imaging of the chest was performed using the standard protocol during bolus administration of intravenous contrast. Multiplanar CT image reconstructions and MIPs were obtained to evaluate the vascular anatomy. CONTRAST:  60 cc Omnipaque 350 COMPARISON:  Chest radiograph-10/14/2015 FINDINGS: Examination degraded secondary to patient respiratory artifact. Vascular Findings: There is adequate opacification of the pulmonary arterial system with the main pulmonary artery measuring 314 Hounsfield units. No discrete filling defects within the pulmonary arterial tree to suggest pulmonary embolism. Normal  caliber of the main pulmonary  artery. Cardiomegaly. Left anterior chest wall 3 lead AICD/pacemaker. Coronary artery calcifications. Normal caliber the thoracic aorta. Conventional configuration of the aortic arch. The branch vessels of the aortic arch appear patent throughout their imaged course. Minimal amount of mixed calcified and noncalcified atherosclerotic plaque within a tortuous but normal caliber descending thoracic aorta. Descending thoracic aorta is mildly tortuous but of normal caliber. Review of the MIP images confirms the above findings. ---------------------------------------------------------------------------------- Nonvascular Findings: Mediastinum/Lymph Nodes: No bulky mediastinal, hilar axillary lymphadenopathy. Lungs/Pleura: Rather extensive subpleural reticular opacities with subpleural reticular opacities, most conspicuous within the lung apices and subpleural dependent portions of the bilateral lower lobes, right greater than left, without discrete area of honeycombing on this motion degraded examination. No discrete focal airspace opacities. No pleural effusion or pneumothorax. There is mild short-segment bronchial occlusion involving the antral medial basilar segmental bronchus of the left lower lobe (image 49, series 7) and mild diffuse bronchial wall thickening primarily involving the bilateral lower lobe segmental and subsegmental bronchi most conspicuous within the left lower lobe. The remaining central pulmonary airways appear patent. No air bronchograms. Punctate (approximately 4 mm) calcified granuloma within the left lower lobe (image 47, series 7). Otherwise, no discrete pulmonary nodules given limitation of the examination. Upper abdomen: Limited early arterial phase evaluation of the upper abdomen is unremarkable. Musculoskeletal: No acute or aggressive osseous abnormalities. Stigmata of DISH throughout the thoracic spine. Old/healed fracture involving the posterior aspect of the right ninth rib (image 71,  series 5). Note is made of mild grossly symmetric bilateral gynecomastia. IMPRESSION: 1. Suspected mild airways disease, most conspicuously involving the left lower lobe without focal airspace opacities to suggest pneumonia. 2. Otherwise, no acute cardiopulmonary disease. Specifically, no evidence of pulmonary embolism. 3. Rather extensive subpleural reticular opacities suggestive of pulmonary fibrosis but without definitive evidence of honeycombing to suggest UIP. If not previously performed, this patient could benefit from nonemergent pulmonary medicine consult and/or the acquisition of an ILD protocol chest CT. 4. Cardiomegaly. Left anterior chest wall 3 lead AICD/pacemaker placement. 5. Coronary artery calcifications. Aortic Atherosclerosis (ICD10-I70.0). Electronically Signed   By: Sandi Mariscal M.D.   On: 12/22/2017 14:01    EKG:   Orders placed or performed during the hospital encounter of 12/22/17  . EKG 12-Lead  . EKG 12-Lead    ASSESSMENT AND PLAN:   84 year old male patient with acute respiratory failure with hypoxia secondary to community-acquired pneumonia, CT chest confirmed a left-sided pneumonia: Started on Levaquin, continue oxygen, still requiring 2 L of oxygen, will wean down the oxygen and see how much he saturates.  Most likely he will stay tonight, continue Levaquin, oxygen, possible discharge home tomorrow. 2.  Chronic systolic heart failure; stable.  EF around 40% by echocardiogram 2 years ago.  Not on diuretics at home. 3.  aCute on chronic renal failure, patient baseline creatinine around 1.3, 1.5 on admission, creatinine is better today at 1.47.  Patient's GFR is around 42.,  Discontinue IV fluids, do indapamide at discharge.   4.  Diabetes mellitus type 2: Metformin not started because of renal deficiency, start glipizide.  #5  Relative hypotension: Improved.,  Now has tachycardia, renew Coreg, monitor for further adjustment of Coreg if needed.  #6. cough secondary to  pneumonia, continue Robitussin, antibiotics, bronchodilators.  Patient does have inhalers at home. All the records are reviewed and case discussed with Care Management/Social Workerr. Management plans discussed with the patient, family and they are in agreement.  CODE  STATUS: Full code  TOTAL TIME TAKING CARE OF THIS PATIENT: 35 minutes  More than 50% time spent in counseling, coordination of care. Possible discharge home tomorrow if able to wean off oxygen   Epifanio Lesches M.D on 12/23/2017 at 12:41 PM  Between 7am to 6pm - Pager - [phone]  After 6pm go to [email or web address] - password [email or web address] [facility]  Office  [phone]  CC: Primary care physician; Rusty Aus, MD   Note: This dictation was prepared with Dragon dictation along with smaller phrase technology. Any transcriptional errors that result from this process are unintentional.

## 2017-12-23 NOTE — Progress Notes (Signed)
   12/23/17 1100  Clinical Encounter Type  Visited With Patient  Visit Type Initial;Spiritual support  Recommendations Follow-up as needed.  Spiritual Encounters  Spiritual Needs Emotional   Chaplain was engaged in an introductory encounter, when the patient's physician arrived. Chaplain concluded the visit with the invitation to return as needed by the patient.

## 2017-12-23 NOTE — Plan of Care (Signed)
  Problem: Education: Goal: Knowledge of General Education information will improve Description: Including pain rating scale, medication(s)/side effects and non-pharmacologic comfort measures Outcome: Progressing   Problem: Clinical Measurements: Goal: Respiratory complications will improve Outcome: Progressing   

## 2017-12-24 LAB — PROCALCITONIN: Procalcitonin: 0.14 ng/mL

## 2017-12-24 LAB — GLUCOSE, CAPILLARY
GLUCOSE-CAPILLARY: 101 mg/dL — AB (ref 70–99)
GLUCOSE-CAPILLARY: 85 mg/dL (ref 70–99)
Glucose-Capillary: 57 mg/dL — ABNORMAL LOW (ref 70–99)
Glucose-Capillary: 113 mg/dL — ABNORMAL HIGH (ref 70–99)
Glucose-Capillary: 128 mg/dL — ABNORMAL HIGH (ref 70–99)

## 2017-12-24 MED ORDER — IPRATROPIUM-ALBUTEROL 0.5-2.5 (3) MG/3ML IN SOLN
3.0000 mL | Freq: Two times a day (BID) | RESPIRATORY_TRACT | Status: DC
  Administered 2017-12-24 – 2017-12-25 (×2): 3 mL via RESPIRATORY_TRACT
  Filled 2017-12-24 (×3): qty 3

## 2017-12-24 MED ORDER — LEVOFLOXACIN 250 MG PO TABS: 250.0000 mg | ORAL_TABLET | Freq: Every day | ORAL | 0 refills | Status: DC

## 2017-12-24 MED ORDER — ALBUTEROL SULFATE (2.5 MG/3ML) 0.083% IN NEBU: 2.5000 mg | INHALATION_SOLUTION | RESPIRATORY_TRACT | Status: DC | PRN

## 2017-12-24 MED ORDER — IPRATROPIUM-ALBUTEROL 0.5-2.5 (3) MG/3ML IN SOLN: 3.0000 mL | Freq: Four times a day (QID) | RESPIRATORY_TRACT | 0 refills | Status: DC

## 2017-12-24 MED ORDER — GLIMEPIRIDE 1 MG PO TABS: 1.0000 mg | ORAL_TABLET | Freq: Every day | ORAL | 0 refills | Status: DC

## 2017-12-24 MED ORDER — BUDESONIDE 0.5 MG/2ML IN SUSP: 0.5000 mg | Freq: Two times a day (BID) | RESPIRATORY_TRACT | 12 refills | Status: DC

## 2017-12-24 MED ORDER — SODIUM CHLORIDE 0.9 % IV SOLN
INTRAVENOUS | Status: DC | PRN
  Administered 2017-12-24: 15:00:00 via INTRAVENOUS

## 2017-12-24 NOTE — Progress Notes (Signed)
[facility] [place] NAME: Thomas Morton    MR#:  [id number]  DATE OF BIRTH:  [birth year]  SUBJECTIVE: hypoglycemic today,has cough but better than yesterday,  CHIEF COMPLAINT:   Chief Complaint  Patient presents with  . Fever    REVIEW OF SYSTEMS:   ROS CONSTITUTIONAL: No fever, fatigue or weakness.  EYES: No blurred or double vision.  EARS, NOSE, AND THROAT: No tinnitus or ear pain.  RESPIRATORY: Has some cough but no shortness of breath.  CARDIOVASCULAR: No chest pain, orthopnea, edema.  GASTROINTESTINAL: No nausea, vomiting, diarrhea or abdominal pain.  GENITOURINARY: No dysuria, hematuria.  ENDOCRINE: No polyuria, nocturia,  HEMATOLOGY: No anemia, easy bruising or bleeding SKIN: No rash or lesion. MUSCULOSKELETAL: No joint pain or arthritis.   NEUROLOGIC: No tingling, numbness, weakness.  PSYCHIATRY: No anxiety or depression.   DRUG ALLERGIES:   Allergies  Allergen Reactions  . Sulfa Antibiotics Rash    Per patient low doses do not cause issues, only higher doses    VITALS:  Blood pressure (!) 114/55, pulse 69, temperature 97.7 F (36.5 C), temperature source Oral, resp. rate 16, height 5\' 10"  (1.778 m), weight 89.4 kg, SpO2 98 %.  PHYSICAL EXAMINATION:  GENERAL:  84 y.o.-year-old patient lying in the bed with no acute distress.  EYES: Pupils equal, round, reactive to light and accommodation. No scleral icterus. Extraocular muscles intact.  HEENT: Head atraumatic, normocephalic. Oropharynx and nasopharynx clear.  NECK:  Supple, no jugular venous distention. No thyroid enlargement, no tenderness.  LUNGS:  diminished air entry bilaterally.  CARDIOVASCULAR: S1, S2  No murmurs, rubs, or gallops.  ABDOMEN: Soft, nontender, nondistended. Bowel sounds present. No organomegaly or mass.  EXTREMITIES: No pedal edema, cyanosis, or clubbing.  NEUROLOGIC: Cranial nerves II through XII are intact. Muscle strength 5/5 in all  extremities. Sensation intact. Gait not checked.  PSYCHIATRIC: The patient is alert and oriented x 3.  SKIN: No obvious rash, lesion, or ulcer.    LABORATORY PANEL:   CBC Recent Labs  Lab 12/23/17 0520  WBC 9.0  HGB 12.0*  HCT 37.0*  PLT 149*   ------------------------------------------------------------------------------------------------------------------  Chemistries  Recent Labs  Lab 12/22/17 1214 12/23/17 0520  NA 136 137  K 4.2 4.1  CL 101 102  CO2 28 29  GLUCOSE 145* 125*  BUN 23 25*  CREATININE 1.51* 1.47*  CALCIUM 8.9 8.4*  AST 19  --   ALT 15  --   ALKPHOS 58  --   BILITOT 0.9  --    ------------------------------------------------------------------------------------------------------------------  Cardiac Enzymes Recent Labs  Lab 12/22/17 1214  TROPONINI <0.03   ------------------------------------------------------------------------------------------------------------------  RADIOLOGY:  Ct Angio Chest Pe W And/or Wo Contrast  Result Date: 12/22/2017 CLINICAL DATA:  Fever and congestion. Decreased oxygen saturation. Evaluate for pulmonary embolism. EXAM: CT ANGIOGRAPHY CHEST WITH CONTRAST TECHNIQUE: Multidetector CT imaging of the chest was performed using the standard protocol during bolus administration of intravenous contrast. Multiplanar CT image reconstructions and MIPs were obtained to evaluate the vascular anatomy. CONTRAST:  60 cc Omnipaque 350 COMPARISON:  Chest radiograph-10/14/2015 FINDINGS: Examination degraded secondary to patient respiratory artifact. Vascular Findings: There is adequate opacification of the pulmonary arterial system with the main pulmonary artery measuring 314 Hounsfield units. No discrete filling defects within the pulmonary arterial tree to suggest pulmonary embolism. Normal caliber of the main pulmonary artery. Cardiomegaly. Left anterior chest wall 3 lead AICD/pacemaker. Coronary artery calcifications. Normal caliber the  thoracic aorta. Conventional configuration  of the aortic arch. The branch vessels of the aortic arch appear patent throughout their imaged course. Minimal amount of mixed calcified and noncalcified atherosclerotic plaque within a tortuous but normal caliber descending thoracic aorta. Descending thoracic aorta is mildly tortuous but of normal caliber. Review of the MIP images confirms the above findings. ---------------------------------------------------------------------------------- Nonvascular Findings: Mediastinum/Lymph Nodes: No bulky mediastinal, hilar axillary lymphadenopathy. Lungs/Pleura: Rather extensive subpleural reticular opacities with subpleural reticular opacities, most conspicuous within the lung apices and subpleural dependent portions of the bilateral lower lobes, right greater than left, without discrete area of honeycombing on this motion degraded examination. No discrete focal airspace opacities. No pleural effusion or pneumothorax. There is mild short-segment bronchial occlusion involving the antral medial basilar segmental bronchus of the left lower lobe (image 49, series 7) and mild diffuse bronchial wall thickening primarily involving the bilateral lower lobe segmental and subsegmental bronchi most conspicuous within the left lower lobe. The remaining central pulmonary airways appear patent. No air bronchograms. Punctate (approximately 4 mm) calcified granuloma within the left lower lobe (image 47, series 7). Otherwise, no discrete pulmonary nodules given limitation of the examination. Upper abdomen: Limited early arterial phase evaluation of the upper abdomen is unremarkable. Musculoskeletal: No acute or aggressive osseous abnormalities. Stigmata of DISH throughout the thoracic spine. Old/healed fracture involving the posterior aspect of the right ninth rib (image 71, series 5). Note is made of mild grossly symmetric bilateral gynecomastia. IMPRESSION: 1. Suspected mild airways disease,  most conspicuously involving the left lower lobe without focal airspace opacities to suggest pneumonia. 2. Otherwise, no acute cardiopulmonary disease. Specifically, no evidence of pulmonary embolism. 3. Rather extensive subpleural reticular opacities suggestive of pulmonary fibrosis but without definitive evidence of honeycombing to suggest UIP. If not previously performed, this patient could benefit from nonemergent pulmonary medicine consult and/or the acquisition of an ILD protocol chest CT. 4. Cardiomegaly. Left anterior chest wall 3 lead AICD/pacemaker placement. 5. Coronary artery calcifications. Aortic Atherosclerosis (ICD10-I70.0). Electronically Signed   By: Sandi Mariscal M.D.   On: 12/22/2017 14:01    EKG:   Orders placed or performed during the hospital encounter of 12/22/17  . EKG 12-Lead  . EKG 12-Lead    ASSESSMENT AND PLAN:   84 year old male patient with acute respiratory failure with hypoxia secondary to community-acquired pneumonia, CT chest confirmed a left-sided pneumonia: Started on Levaquin, continue oxygen,qualified for home o2 .Marland Kitchen 2.  Chronic systolic heart failure; stable.  EF around 40% by echocardiogram 2 years ago.  Not on diuretics at home. 3.  aCute on chronic renal failure, patient baseline creatinine around 1.3, 1.5 on admission, creatinine is better today at 1.47.  Patient's GFR is around 42.,  Discontinue IV fluids, do indapamide at discharge.   4.  Diabetes mellitus type 2: hypoglycemia.hold glipizide.does not take metformin. Possible discharge home am ,due to hypoglycemia can not discharge home today,dose of glipizide is adjusted.ptis not taking metformin any more.  #5  Relative hypotension: Improved., continue coreg,  #6. cough secondary to pneumonia, continue Robitussin, antibiotics, bronchodilators.  Patient does have inhalers at home. All the records are reviewed and case discussed with Care Management/Social Workerr. Management plans discussed with the  patient, family and they are in agreement.  CODE STATUS: Full code  TOTAL TIME TAKING CARE OF THIS PATIENT: 35 minutes  More than 50% time spent in counseling, coordination of care. Possible discharge home tomorrow if able to wean off oxygen   Epifanio Lesches M.D on 12/24/2017 at 1:27 PM  Between 7am  to 6pm - Pager - [phone]  After 6pm go to [email or web address] - password [email or web address] [facility]  Office  [phone]  CC: Primary care physician; Rusty Aus, MD   Note: This dictation was prepared with Dragon dictation along with smaller phrase technology. Any transcriptional errors that result from this process are unintentional.

## 2017-12-24 NOTE — Care Management (Signed)
Patient admitted with acute respiratory failure with hypoxia secondary to community-acquired pneumonia.  Patient lives at home alone.  However after discharge he states that his daughter and son will be staying with him.  PCP Emily Filbert.  Pharmacy [facility].  Patient denies issues obtaining medications.  Patient states that at baseline he is independent and drives himself.    Patient currently requiring 3l acute O2.  Patient currently declining acute O2 and home health nursing. Son and other family members are aware of his decision.

## 2017-12-24 NOTE — Progress Notes (Signed)
  SATURATION QUALIFICATIONS  Patient Saturations on Room Air at Rest = 96%  Patient Saturations on Room Air while Ambulating = 88%  Patient Saturations on 3 Liters of oxygen while Ambulating = 96%  Please briefly explain why patient needs home oxygen: Patient oxygen is dropping to 88%% on room air with ambulation.

## 2017-12-24 NOTE — Care Management Important Message (Signed)
Important Message  Patient Details  Name: Thomas Morton MRN: [id number] Date of Birth: 06-01-1927   Medicare Important Message Given:  Yes    Juliann Pulse A Aladdin Kollmann 12/24/2017, 11:04 AM

## 2017-12-24 NOTE — Progress Notes (Signed)
Seen the patient at bedside, he is feeling much better however has hypoglycemia this morning.  Patient says that he takes glimepiride 1 mg in the morning and 1 mg at night, here he is getting 2 mg at night so changed it to 1 mg in the morning at discharge medication reconciliation and he needs to follow-up with PCP regarding further titration.  Patient told me that he is not taking metformin anymore. 1.  Acute respiratory failure with hypoxia secondary to pneumonia: Improving, continue Levaquin at discharge along with Pulmicort nebs, check ambulatory oxygen saturation to see if he qualifies for home oxygen. 2.  Diabetes mellitus type 2 with episode of hypoglycemia this morning, patient is asymptomatic, cut down the dose of glimepiride at discharge, discontinued metformin as he is not taking at home as well. 3. Acute  Chronic renal failure: Improved Total time spent more than 30 minutes.

## 2017-12-25 LAB — GLUCOSE, CAPILLARY
GLUCOSE-CAPILLARY: 116 mg/dL — AB (ref 70–99)
Glucose-Capillary: 182 mg/dL — ABNORMAL HIGH (ref 70–99)
Glucose-Capillary: 113 mg/dL — ABNORMAL HIGH (ref 70–99)

## 2017-12-25 MED ORDER — LEVOFLOXACIN 250 MG PO TABS: 250.0000 mg | ORAL_TABLET | Freq: Every day | ORAL | 0 refills | Status: AC

## 2017-12-25 MED ORDER — METHYLPREDNISOLONE SODIUM SUCC 40 MG IJ SOLR
40.0000 mg | Freq: Once | INTRAMUSCULAR | Status: AC
  Administered 2017-12-25: 40 mg via INTRAVENOUS
  Filled 2017-12-25: qty 1

## 2017-12-25 NOTE — Progress Notes (Signed)
[facility] [place] NAME: Janari Yamada    MR#:  [id number]  DATE OF BIRTH:  [birth year]  SUBJECTIVE:  CHIEF COMPLAINT:   Chief Complaint  Patient presents with  . Fever   -Feels much better.  No fevers.  Not using oxygen at rest currently  REVIEW OF SYSTEMS:  Review of Systems  Constitutional: Positive for malaise/fatigue. Negative for chills and fever.  HENT: Negative for congestion, ear discharge, hearing loss and nosebleeds.   Respiratory: Positive for cough and shortness of breath. Negative for wheezing.   Cardiovascular: Negative for chest pain and palpitations.  Gastrointestinal: Negative for abdominal pain, constipation, diarrhea, nausea and vomiting.  Genitourinary: Negative for dysuria.  Neurological: Negative for dizziness, seizures and headaches.    DRUG ALLERGIES:   Allergies  Allergen Reactions  . Sulfa Antibiotics Rash    Per patient low doses do not cause issues, only higher doses    VITALS:  Blood pressure 110/69, pulse 83, temperature 98.2 F (36.8 C), temperature source Oral, resp. rate 19, height 5\' 10"  (1.778 m), weight 89.4 kg, SpO2 95 %.  PHYSICAL EXAMINATION:  Physical Exam  GENERAL:  84 y.o.-year-old patient lying in the bed with no acute distress.  EYES: Pupils equal, round, reactive to light and accommodation. No scleral icterus. Extraocular muscles intact.  HEENT: Head atraumatic, normocephalic. Oropharynx and nasopharynx clear.  NECK:  Supple, no jugular venous distention. No thyroid enlargement, no tenderness.  LUNGS: Normal breath sounds bilaterally, no wheezing, rales,rhonchi or crepitation. No use of accessory muscles of respiration.  Decreased bibasilar breath sounds CARDIOVASCULAR: S1, S2 normal. No  rubs, or gallops.  2/6 systolic murmur is present ABDOMEN: Soft, nontender, nondistended. Bowel sounds present. No organomegaly or mass.  EXTREMITIES: No pedal edema, cyanosis, or clubbing.    NEUROLOGIC: Cranial nerves II through XII are intact. Muscle strength 5/5 in all extremities. Sensation intact. Gait not checked.  PSYCHIATRIC: The patient is alert and oriented x 3.  SKIN: No obvious rash, lesion, or ulcer.    LABORATORY PANEL:   CBC Recent Labs  Lab 12/23/17 0520  WBC 9.0  HGB 12.0*  HCT 37.0*  PLT 149*   ------------------------------------------------------------------------------------------------------------------  Chemistries  Recent Labs  Lab 12/22/17 1214 12/23/17 0520  NA 136 137  K 4.2 4.1  CL 101 102  CO2 28 29  GLUCOSE 145* 125*  BUN 23 25*  CREATININE 1.51* 1.47*  CALCIUM 8.9 8.4*  AST 19  --   ALT 15  --   ALKPHOS 58  --   BILITOT 0.9  --    ------------------------------------------------------------------------------------------------------------------  Cardiac Enzymes Recent Labs  Lab 12/22/17 1214  TROPONINI <0.03   ------------------------------------------------------------------------------------------------------------------  RADIOLOGY:  No results found.  EKG:   Orders placed or performed during the hospital encounter of 12/22/17  . EKG 12-Lead  . EKG 12-Lead    ASSESSMENT AND PLAN:   84 year old male with past medical history significant for hypertension, hyperlipidemia, diabetes and history of prostate cancer presents to hospital secondary to hypoxia  1.  Right-sided pneumonia-sent in from outpatient office secondary to hypoxia -CT angios negative for PE but showing left lower lobe airspace disease.  Also evidence of some pulmonary fibrosis. -Requiring 2 to 3 L of oxygen here in the hospital. -Wean off oxygen.  On oral Levaquin at this time. -Will need outpatient pulmonary consult for pulmonary fibrosis.  2.  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  Will need ACE  inhibitor or ARB as outpatient  3.  Arthritis/gout-continue outpatient medications  4.  Beatties mellitus-glimepiride dose reduced due to low normal sugars in the hospital.  Patient has been ambulating well.  Anticipate discharge today if weaned off the oxygen   All the records are reviewed and case discussed with Care Management/Social Workerr. Management plans discussed with the patient, family and they are in agreement.  CODE STATUS: Full code  TOTAL TIME TAKING CARE OF THIS PATIENT: 38 minutes.   POSSIBLE D/C IN 1-2 DAYS, DEPENDING ON CLINICAL CONDITION.   Gladstone Lighter M.D on 12/25/2017 at 2:20 PM  Between 7am to 6pm - Pager - [phone]  After 6pm go to [email or web address] - password [email or web address] [facility]  Office  [phone]  CC: Primary care physician; Rusty Aus, MD

## 2017-12-25 NOTE — Discharge Instructions (Signed)
Acute Respiratory Distress Syndrome, Adult Acute respiratory distress syndrome is a life-threatening condition in which fluid collects in the lungs. This prevents the lungs from filling with air and passing oxygen into the blood. This can cause the lungs and other vital organs to fail. The condition usually develops following an infection, illness, surgery, or injury. What are the causes? This condition may be caused by:  An infection, such as sepsis or pneumonia.  A serious injury to the head or chest.  Severe bleeding from an injury.  A major surgery.  Breathing in harmful chemicals or smoke.  Blood transfusions.  A blood clot in the lungs.  Breathing in vomit (aspiration).  Near-drowning.  Inflammation of the pancreas (pancreatitis).  A drug overdose.  What are the signs or symptoms? Sudden shortness of breath and rapid breathing are the main symptoms of this condition. Other symptoms may include:  A fast or irregular heartbeat.  Skin, lips, or fingernails that look blue (cyanosis).  Confusion.  Tiredness or loss of energy.  Chest pain, particularly while taking a breath.  Coughing.  Restlessness or anxiety.  Fever. This is usually present if there is an underlying infection, such as pneumonia.  How is this diagnosed? This condition is diagnosed based on:  Your symptoms.  Medical history.  A physical exam. During the exam, your health care provider will listen to your heart and check for crackling or wheezing sounds in your lungs.  You may also have other tests to confirm the diagnosis and measure how well your lungs are working. These may include:  Measuring the amount of oxygen in your blood. Your health care provider will use two methods to do this procedure: ? A small device (pulse oximeter) that is placed on your finger, earlobe, or toe. ? An arterial blood gas test. A sample of blood is taken from an artery and tested for oxygen levels.  Blood  tests.  Chest X-rays or CT scans to look for fluid in the lungs.  Taking a sample of your sputum to test for infection.  Heart test, such as an echocardiogram or electrocardiogram. This is done to rule out any heart problems (such as heart failure) that may be causing your symptoms.  Bronchoscopy. During this test, a thin, flexible tube with a light is passed into the mouth or nose, down the windpipe, and into the lungs.  How is this treated? Treatment depends on the cause of your condition. The goal is to support you while your lungs heal and the underlying cause is treated. Treatment may include:  Oxygen therapy. This may be done through: ? A tube in your nose or a face mask. ? A ventilator. This device helps move air into and out of your lungs through a breathing tube that is inserted into your mouth or nose.  Continuous positive airway pressure (CPAP). This treatment uses mild air pressure to keep the airways open. A mask or other device will be placed over your nose or mouth.  Tracheostomy. During this procedure, a small cut is made in your neck to create an opening to your windpipe. A breathing tube is placed directly into your windpipe. The breathing tube is connected to a ventilator. This is done if you have problems with your airway or if you need a ventilator for a long period of time.  Positioning you to lie on your stomach (prone position).  Medicines, such as: ? Sedatives to help you relax. ? Blood pressure medicines. ? Antibiotics to treat  infection. ? Blood thinners to prevent blood clots. ? Diuretics to help prevent excess fluid.  Fluids and nutrients given through an IV tube.  Wearing compression stockings on your legs to prevent blood clots.  Extra corporeal membrane oxygenation (ECMO). This treatment takes blood outside your body, adds oxygen, and removes carbon dioxide. The blood is then returned to your body. This treatment is only used in severe  cases.  Follow these instructions at home:  Take over-the-counter and prescription medicines only as told by your health care provider.  Do not use any products that contain nicotine or tobacco, such as cigarettes and e-cigarettes. If you need help quitting, ask your health care provider.  Limit alcohol intake to no more than 1 drink per day for nonpregnant women and 2 drinks per day for men. One drink equals 12 oz of beer, 5 oz of wine, or 1 oz of hard liquor.  Ask friends and family to help you if daily activities make you tired.  Attend any pulmonary rehabilitation as told by your health care provider. This may include: ? Education about your condition. ? Exercises. ? Breathing training. ? Counseling. ? Learning techniques to conserve energy. ? Nutrition counseling.  Keep all follow-up visits as told by your health care provider. This is important. Contact a health care provider if:  You become short of breath during activity or while resting.  You develop a cough that does not go away.  You have a fever.  Your symptoms do not get better or they get worse.  You become anxious or depressed. Get help right away if:  You have sudden shortness of breath.  You develop sudden chest pain that does not go away.  You develop a rapid heart rate.  You develop swelling or pain in one of your legs.  You cough up blood.  You have trouble breathing.  Your skin, lips, or fingernails turn blue. These symptoms may represent a serious problem that is an emergency. Do not wait to see if the symptoms will go away. Get medical help right away. Call your local emergency services (911 in the [place] Do not drive yourself to the hospital. Summary  Acute respiratory distress syndrome is a life-threatening condition in which fluid collects in the lungs, which leads the lungs and other vital organs to fail.  This condition usually develops following an infection, illness, surgery, or  injury.  Sudden shortness of breath and rapid breathing are the main symptoms of acute respiratory distress syndrome.  Treatment may include oxygen therapy, continuous positive airway pressure (CPAP), tracheostomy, lying on your stomach (prone position), medicines, fluids and nutrients given through an IV tube, compression stockings, and extra corporeal membrane oxygenation (ECMO). This information is not intended to replace advice given to you by your health care provider. Make sure you discuss any questions you have with your health care provider. Document Released: 12/29/2004 Document Revised: 12/16/2015 Document Reviewed: 12/16/2015 Elsevier Interactive Patient Education  2017 [place] Pneumonia, Adult Pneumonia is an infection of the lungs. One type of pneumonia can happen while a person is in a hospital. A different type can happen when a person is not in a hospital (community-acquired pneumonia). It is easy for this kind to spread from person to person. It can spread to you if you breathe near an infected person who coughs or sneezes. Some symptoms include:  A dry cough.  A wet (productive) cough.  Fever.  Sweating.  Chest pain.  Follow these  instructions at home:  Take over-the-counter and prescription medicines only as told by your doctor. ? Only take cough medicine if you are losing sleep. ? If you were prescribed an antibiotic medicine, take it as told by your doctor. Do not stop taking the antibiotic even if you start to feel better.  Sleep with your head and neck raised (elevated). You can do this by putting a few pillows under your head, or you can sleep in a recliner.  Do not use tobacco products. These include cigarettes, chewing tobacco, and e-cigarettes. If you need help quitting, ask your doctor.  Drink enough water to keep your pee (urine) clear or pale yellow. A shot (vaccine) can help prevent pneumonia. Shots are often suggested  for:  People older than 82 years of age.  People older than 82 years of age: ? Who are having cancer treatment. ? Who have long-term (chronic) lung disease. ? Who have problems with their body's defense system (immune system).  You may also prevent pneumonia if you take these actions:  Get the flu (influenza) shot every year.  Go to the dentist as often as told.  Wash your hands often. If soap and water are not available, use hand sanitizer.  Contact a doctor if:  You have a fever.  You lose sleep because your cough medicine does not help. Get help right away if:  You are short of breath and it gets worse.  You have more chest pain.  Your sickness gets worse. This is very serious if: ? You are an older adult. ? Your body's defense system is weak.  You cough up blood. This information is not intended to replace advice given to you by your health care provider. Make sure you discuss any questions you have with your health care provider. Document Released: 06/17/2007 Document Revised: 06/06/2015 Document Reviewed: 04/25/2014 Elsevier Interactive Patient Education  [employer].

## 2017-12-25 NOTE — Progress Notes (Signed)
Discharge teaching given to patient, patient verbalized understanding and had no questions. Patient IV removed. Patient will be transported home by family. All patient belongings gathered prior to leaving.  

## 2017-12-27 LAB — CULTURE, BLOOD (ROUTINE X 2)
Culture: NO GROWTH
Culture: NO GROWTH
Special Requests: ADEQUATE

## 2017-12-27 NOTE — Discharge Summary (Signed)
[facility] NAME: Thomas Morton    MR#:  [id number]  DATE OF BIRTH:  November 08, 1927  DATE OF ADMISSION:  12/22/2017   ADMITTING PHYSICIAN: Loletha Grayer, MD  DATE OF DISCHARGE: 12/25/2017  7:41 PM  PRIMARY CARE PHYSICIAN: Rusty Aus, MD   ADMISSION DIAGNOSIS:   Hypoxia [R09.02] Community acquired pneumonia of left lower lobe of lung ([place]) [J18.1]  DISCHARGE DIAGNOSIS:   Active Problems:   Acute respiratory failure with hypoxia ([place])   SECONDARY DIAGNOSIS:   Past Medical History:  Diagnosis Date  . Cancer [facility])    prostate 2001  . CHF (congestive heart failure) ([place])   . Diabetes mellitus without complication ([place])   . Hyperlipidemia     HOSPITAL COURSE:   84 year old male with past medical history significant for hypertension, hyperlipidemia, diabetes and history of prostate cancer presents to hospital secondary to hypoxia  1.  Right-sided pneumonia-sent in from outpatient office secondary to hypoxia -CT angios negative for PE but showing left lower lobe airspace disease.  Also evidence of some pulmonary fibrosis. -Required 2 to 3 L of oxygen here in the hospital. -Weaned off oxygen.  On oral Levaquin at this time.  Duo nebs and inhalers advised -Will need outpatient pulmonary follow up for pulmonary fibrosis.  2.  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  Will need ACE inhibitor or ARB as outpatient  3.  Arthritis/gout-continue outpatient medications  4.  Diabetes mellitus-glimepiride dose reduced due to low normal sugars in the hospital.  5.  GERD-Protonix  Patient has been ambulating well.    Patient will be discharged today.  He is weaned off oxygen   DISCHARGE CONDITIONS:   Guarded  CONSULTS OBTAINED:   None  DRUG ALLERGIES:   Allergies  Allergen  Reactions  . Sulfa Antibiotics Rash    Per patient low doses do not cause issues, only higher doses   DISCHARGE MEDICATIONS:   Allergies as of 12/25/2017      Reactions   Sulfa Antibiotics Rash   Per patient low doses do not cause issues, only higher doses      Medication List    STOP taking these medications   metFORMIN 1000 MG tablet Commonly known as:  GLUCOPHAGE     TAKE these medications   allopurinol 300 MG tablet Commonly known as:  ZYLOPRIM Take 1 tablet by mouth at bedtime.   aspirin 325 MG EC tablet Take 1 tablet (325 mg total) by mouth daily.   atorvastatin 40 MG tablet Commonly known as:  LIPITOR Take 1 tablet (40 mg total) by mouth daily at 6 PM.   betamethasone dipropionate 0.05 % cream Commonly known as:  DIPROLENE Apply 1 application topically 2 (two) times daily as needed.   budesonide 0.5 MG/2ML nebulizer solution Commonly known as:  PULMICORT Take 2 mLs (0.5 mg total) by nebulization 2 (two) times daily.   carvedilol 3.125 MG tablet Commonly known as:  COREG Take 1 tablet by mouth 2 (two) times daily.   etodolac 400 MG tablet Commonly known as:  LODINE Take 400 mg by mouth daily as needed.   glimepiride 1 MG tablet Commonly known as:  AMARYL Take 1 tablet (1 mg total) by mouth daily with breakfast. What changed:    medication strength  how much to take  when to take this   indapamide 1.25  MG tablet Commonly known as:  LOZOL 1 tablet daily.   ipratropium-albuterol 0.5-2.5 (3) MG/3ML Soln Commonly known as:  DUONEB Take 3 mLs by nebulization every 6 (six) hours.   levofloxacin 250 MG tablet Commonly known as:  LEVAQUIN Take 1 tablet (250 mg total) by mouth daily for 5 days.   pantoprazole 40 MG tablet Commonly known as:  PROTONIX Take 1 tablet by mouth as needed.   triamcinolone 0.025 % cream Commonly known as:  KENALOG Apply 1 application topically as needed.        DISCHARGE INSTRUCTIONS:   1. PCP f/u in 1-2  weeks  DIET:   Cardiac diet  ACTIVITY:   Activity as tolerated  OXYGEN:   Home Oxygen: No.  Oxygen Delivery: room air  DISCHARGE LOCATION:   home   If you experience worsening of your admission symptoms, develop shortness of breath, life threatening emergency, suicidal or homicidal thoughts you must seek medical attention immediately by calling 911 or calling your MD immediately  if symptoms less severe.  You Must read complete instructions/literature along with all the possible adverse reactions/side effects for all the Medicines you take and that have been prescribed to you. Take any new Medicines after you have completely understood and accpet all the possible adverse reactions/side effects.   Please note  You were cared for by a hospitalist during your hospital stay. If you have any questions about your discharge medications or the care you received while you were in the hospital after you are discharged, you can call the unit and asked to speak with the hospitalist on call if the hospitalist that took care of you is not available. Once you are discharged, your primary care physician will handle any further medical issues. Please note that NO REFILLS for any discharge medications will be authorized once you are discharged, as it is imperative that you return to your primary care physician (or establish a relationship with a primary care physician if you do not have one) for your aftercare needs so that they can reassess your need for medications and monitor your lab values.    On the day of Discharge:  VITAL SIGNS:   Blood pressure 110/69, pulse 83, temperature 98.2 F (36.8 C), temperature source Oral, resp. rate 19, height 5\' 10"  (1.778 m), weight 89.4 kg, SpO2 92 %.  PHYSICAL EXAMINATION:    GENERAL:  84 y.o.-year-old patient lying in the bed with no acute distress.  EYES: Pupils equal, round, reactive to light and accommodation. No scleral icterus. Extraocular muscles  intact.  HEENT: Head atraumatic, normocephalic. Oropharynx and nasopharynx clear.  NECK:  Supple, no jugular venous distention. No thyroid enlargement, no tenderness.  LUNGS: Normal breath sounds bilaterally, no wheezing, rales,rhonchi or crepitation. No use of accessory muscles of respiration.  Decreased bibasilar breath sounds CARDIOVASCULAR: S1, S2 normal. No  rubs, or gallops.  2/6 systolic murmur is present ABDOMEN: Soft, nontender, nondistended. Bowel sounds present. No organomegaly or mass.  EXTREMITIES: No pedal edema, cyanosis, or clubbing.  NEUROLOGIC: Cranial nerves II through XII are intact. Muscle strength 5/5 in all extremities. Sensation intact. Gait not checked.  PSYCHIATRIC: The patient is alert and oriented x 3.  SKIN: No obvious rash, lesion, or ulcer.  DATA REVIEW:   CBC Recent Labs  Lab 12/23/17 0520  WBC 9.0  HGB 12.0*  HCT 37.0*  PLT 149*    Chemistries  Recent Labs  Lab 12/22/17 1214 12/23/17 0520  NA 136 137  K 4.2 4.1  CL 101 102  CO2 28 29  GLUCOSE 145* 125*  BUN 23 25*  CREATININE 1.51* 1.47*  CALCIUM 8.9 8.4*  AST 19  --   ALT 15  --   ALKPHOS 58  --   BILITOT 0.9  --      Microbiology Results  Results for orders placed or performed during the hospital encounter of 12/22/17  Blood culture (routine x 2)     Status: None   Collection Time: 12/22/17  1:23 PM  Result Value Ref Range Status   Specimen Description BLOOD WRIST  Final   Special Requests   Final    BOTTLES DRAWN AEROBIC AND ANAEROBIC Blood Culture adequate volume   Culture   Final    NO GROWTH 5 DAYS Performed at [facility], [address], [place], [place] [address]    Report Status 12/27/2017 FINAL  Final  Blood culture (routine x 2)     Status: None   Collection Time: 12/22/17  1:23 PM  Result Value Ref Range Status   Specimen Description BLOOD WRIST  Final   Special Requests   Final    BOTTLES DRAWN AEROBIC AND ANAEROBIC Blood Culture results may not be  optimal due to an excessive volume of blood received in culture bottles   Culture   Final    NO GROWTH 5 DAYS Performed at [facility], [address] [place], [place] [address]    Report Status 12/27/2017 FINAL  Final  Urine Culture     Status: None   Collection Time: 12/22/17  1:23 PM  Result Value Ref Range Status   Specimen Description   Final    URINE, RANDOM Performed at [facility], [address] [place], [place] [address]    Special Requests   Final    NONE Performed at [facility], [address], [place], [place] [address]    Culture   Final    NO GROWTH Performed at [facility], [place] [address] [place], [place] [address]    Report Status 12/23/2017 FINAL  Final  Respiratory Panel by PCR     Status: Abnormal   Collection Time: 12/22/17  6:53 PM  Result Value Ref Range Status   Adenovirus NOT DETECTED NOT DETECTED Final   Coronavirus 229E NOT DETECTED NOT DETECTED Final   Coronavirus HKU1 NOT DETECTED NOT DETECTED Final   Coronavirus NL63 NOT DETECTED NOT DETECTED Final   Coronavirus OC43 NOT DETECTED NOT DETECTED Final   Metapneumovirus NOT DETECTED NOT DETECTED Final   Rhinovirus / Enterovirus DETECTED (A) NOT DETECTED Final   Influenza A NOT DETECTED NOT DETECTED Final   Influenza B NOT DETECTED NOT DETECTED Final   Parainfluenza Virus 1 NOT DETECTED NOT DETECTED Final   Parainfluenza Virus 2 NOT DETECTED NOT DETECTED Final   Parainfluenza Virus 3 NOT DETECTED NOT DETECTED Final   Parainfluenza Virus 4 NOT DETECTED NOT DETECTED Final   Respiratory Syncytial Virus NOT DETECTED NOT DETECTED Final   Bordetella pertussis NOT DETECTED NOT DETECTED Final   Chlamydophila pneumoniae NOT DETECTED NOT DETECTED Final   Mycoplasma pneumoniae NOT DETECTED NOT DETECTED Final    Comment: Performed at [facility], [place] [address], [place], [place] [address]    RADIOLOGY:  No results found.   Management plans discussed with  the patient, family and they are in agreement.  CODE STATUS:  Code Status History    Date Active Date Inactive Code Status Order ID  Comments User Context   12/22/2017 1620 12/25/2017 2246 Full Code [id number]  Loletha Grayer, MD Inpatient   10/14/2015 1055 10/15/2015 1625 Full Code [id number]  Theodoro Grist, MD Inpatient    Advance Directive Documentation     Most Recent Value  Type of Advance Directive  Healthcare Power of [place], Living will  Pre-existing out of facility DNR order (yellow form or pink MOST form)  -  "MOST" Form in Place?  -      TOTAL TIME TAKING CARE OF THIS PATIENT: 38 minutes.    Gladstone Lighter M.D on 12/27/2017 at 2:39 PM  Between 7am to 6pm - Pager - [phone]  After 6pm go to [email or web address] - Proofreader  [employer]  Hospitalists  Office  [phone]  CC: Primary care physician; Rusty Aus, MD   Note: This dictation was prepared with Dragon dictation along with smaller phrase technology. Any transcriptional errors that result from this process are unintentional.

## 2017-12-29 DIAGNOSIS — I5022 Chronic systolic (congestive) heart failure: Secondary | ICD-10-CM | POA: Diagnosis not present

## 2017-12-29 DIAGNOSIS — J181 Lobar pneumonia, unspecified organism: Secondary | ICD-10-CM | POA: Diagnosis not present

## 2018-01-06 DIAGNOSIS — J181 Lobar pneumonia, unspecified organism: Secondary | ICD-10-CM | POA: Diagnosis not present

## 2018-01-20 DIAGNOSIS — H04563 Stenosis of bilateral lacrimal punctum: Secondary | ICD-10-CM | POA: Diagnosis not present

## 2018-02-22 DIAGNOSIS — Z125 Encounter for screening for malignant neoplasm of prostate: Secondary | ICD-10-CM | POA: Diagnosis not present

## 2018-02-22 DIAGNOSIS — I700 Atherosclerosis of aorta: Secondary | ICD-10-CM | POA: Diagnosis not present

## 2018-02-22 DIAGNOSIS — I5022 Chronic systolic (congestive) heart failure: Secondary | ICD-10-CM | POA: Diagnosis not present

## 2018-02-22 DIAGNOSIS — E1151 Type 2 diabetes mellitus with diabetic peripheral angiopathy without gangrene: Secondary | ICD-10-CM | POA: Diagnosis not present

## 2018-03-01 DIAGNOSIS — E538 Deficiency of other specified B group vitamins: Secondary | ICD-10-CM | POA: Diagnosis not present

## 2018-03-01 DIAGNOSIS — I5022 Chronic systolic (congestive) heart failure: Secondary | ICD-10-CM | POA: Diagnosis not present

## 2018-03-01 DIAGNOSIS — E1151 Type 2 diabetes mellitus with diabetic peripheral angiopathy without gangrene: Secondary | ICD-10-CM | POA: Diagnosis not present

## 2018-03-01 DIAGNOSIS — E1142 Type 2 diabetes mellitus with diabetic polyneuropathy: Secondary | ICD-10-CM | POA: Diagnosis not present

## 2018-03-01 DIAGNOSIS — M1A00X0 Idiopathic chronic gout, unspecified site, without tophus (tophi): Secondary | ICD-10-CM | POA: Diagnosis not present

## 2018-03-01 DIAGNOSIS — I480 Paroxysmal atrial fibrillation: Secondary | ICD-10-CM | POA: Diagnosis not present

## 2018-03-01 DIAGNOSIS — I700 Atherosclerosis of aorta: Secondary | ICD-10-CM | POA: Diagnosis not present

## 2018-03-01 DIAGNOSIS — R0602 Shortness of breath: Secondary | ICD-10-CM | POA: Diagnosis not present

## 2018-03-01 DIAGNOSIS — Z0000 Encounter for general adult medical examination without abnormal findings: Secondary | ICD-10-CM | POA: Diagnosis not present

## 2018-03-03 ENCOUNTER — Other Ambulatory Visit: Payer: Self-pay | Admitting: Internal Medicine

## 2018-03-03 DIAGNOSIS — R0602 Shortness of breath: Secondary | ICD-10-CM

## 2018-03-08 ENCOUNTER — Ambulatory Visit: Admission: RE | Admit: 2018-03-08 | Payer: Medicare HMO | Source: Ambulatory Visit

## 2018-03-25 DIAGNOSIS — Z85828 Personal history of other malignant neoplasm of skin: Secondary | ICD-10-CM | POA: Diagnosis not present

## 2018-03-25 DIAGNOSIS — Z08 Encounter for follow-up examination after completed treatment for malignant neoplasm: Secondary | ICD-10-CM | POA: Diagnosis not present

## 2018-03-25 DIAGNOSIS — Z872 Personal history of diseases of the skin and subcutaneous tissue: Secondary | ICD-10-CM | POA: Diagnosis not present

## 2018-03-25 DIAGNOSIS — D485 Neoplasm of uncertain behavior of skin: Secondary | ICD-10-CM | POA: Diagnosis not present

## 2018-03-25 DIAGNOSIS — C44612 Basal cell carcinoma of skin of right upper limb, including shoulder: Secondary | ICD-10-CM | POA: Diagnosis not present

## 2018-03-25 DIAGNOSIS — C44329 Squamous cell carcinoma of skin of other parts of face: Secondary | ICD-10-CM | POA: Diagnosis not present

## 2018-03-25 DIAGNOSIS — L218 Other seborrheic dermatitis: Secondary | ICD-10-CM | POA: Diagnosis not present

## 2018-03-30 ENCOUNTER — Ambulatory Visit
Admission: RE | Admit: 2018-03-30 | Discharge: 2018-03-30 | Disposition: A | Discharge status: Home / Self Care | Payer: Medicare HMO | Source: Ambulatory Visit | Attending: Internal Medicine | Admitting: Internal Medicine

## 2018-03-30 ENCOUNTER — Other Ambulatory Visit: Payer: Self-pay

## 2018-03-30 DIAGNOSIS — R0602 Shortness of breath: Secondary | ICD-10-CM | POA: Diagnosis not present

## 2018-03-30 DIAGNOSIS — J439 Emphysema, unspecified: Secondary | ICD-10-CM | POA: Diagnosis not present

## 2018-03-30 MED ORDER — IOHEXOL 300 MG/ML  SOLN
75.0000 mL | Freq: Once | INTRAMUSCULAR | Status: AC | PRN
  Administered 2018-03-30: 75 mL via INTRAVENOUS

## 2018-03-31 DIAGNOSIS — J439 Emphysema, unspecified: Secondary | ICD-10-CM | POA: Insufficient documentation

## 2018-07-22 ENCOUNTER — Emergency Department (HOSPITAL_BASED_OUTPATIENT_CLINIC_OR_DEPARTMENT_OTHER): Admit: 2018-07-22 | Discharge: 2018-07-22 | Discharge status: Absent without leave | Payer: Medicare HMO

## 2018-08-17 DIAGNOSIS — C44612 Basal cell carcinoma of skin of right upper limb, including shoulder: Secondary | ICD-10-CM | POA: Diagnosis not present

## 2018-08-17 DIAGNOSIS — L570 Actinic keratosis: Secondary | ICD-10-CM | POA: Diagnosis not present

## 2018-08-17 DIAGNOSIS — D485 Neoplasm of uncertain behavior of skin: Secondary | ICD-10-CM | POA: Diagnosis not present

## 2018-08-17 DIAGNOSIS — L218 Other seborrheic dermatitis: Secondary | ICD-10-CM | POA: Diagnosis not present

## 2018-08-17 DIAGNOSIS — C44329 Squamous cell carcinoma of skin of other parts of face: Secondary | ICD-10-CM | POA: Diagnosis not present

## 2018-08-17 DIAGNOSIS — L820 Inflamed seborrheic keratosis: Secondary | ICD-10-CM | POA: Diagnosis not present

## 2018-08-17 DIAGNOSIS — L814 Other melanin hyperpigmentation: Secondary | ICD-10-CM | POA: Diagnosis not present

## 2018-08-17 DIAGNOSIS — D0461 Carcinoma in situ of skin of right upper limb, including shoulder: Secondary | ICD-10-CM | POA: Diagnosis not present

## 2018-08-17 DIAGNOSIS — X32XXXA Exposure to sunlight, initial encounter: Secondary | ICD-10-CM | POA: Diagnosis not present

## 2018-08-24 DIAGNOSIS — M1A00X0 Idiopathic chronic gout, unspecified site, without tophus (tophi): Secondary | ICD-10-CM | POA: Diagnosis not present

## 2018-08-24 DIAGNOSIS — E538 Deficiency of other specified B group vitamins: Secondary | ICD-10-CM | POA: Diagnosis not present

## 2018-08-24 DIAGNOSIS — C44622 Squamous cell carcinoma of skin of right upper limb, including shoulder: Secondary | ICD-10-CM | POA: Diagnosis not present

## 2018-08-24 DIAGNOSIS — D0461 Carcinoma in situ of skin of right upper limb, including shoulder: Secondary | ICD-10-CM | POA: Diagnosis not present

## 2018-08-24 DIAGNOSIS — E1151 Type 2 diabetes mellitus with diabetic peripheral angiopathy without gangrene: Secondary | ICD-10-CM | POA: Diagnosis not present

## 2018-08-31 DIAGNOSIS — E538 Deficiency of other specified B group vitamins: Secondary | ICD-10-CM | POA: Diagnosis not present

## 2018-08-31 DIAGNOSIS — M1A00X0 Idiopathic chronic gout, unspecified site, without tophus (tophi): Secondary | ICD-10-CM | POA: Diagnosis not present

## 2018-08-31 DIAGNOSIS — I5022 Chronic systolic (congestive) heart failure: Secondary | ICD-10-CM | POA: Diagnosis not present

## 2018-08-31 DIAGNOSIS — E1151 Type 2 diabetes mellitus with diabetic peripheral angiopathy without gangrene: Secondary | ICD-10-CM | POA: Diagnosis not present

## 2018-08-31 DIAGNOSIS — J439 Emphysema, unspecified: Secondary | ICD-10-CM | POA: Diagnosis not present

## 2018-09-02 DIAGNOSIS — I480 Paroxysmal atrial fibrillation: Secondary | ICD-10-CM | POA: Diagnosis not present

## 2018-10-05 DIAGNOSIS — M542 Cervicalgia: Secondary | ICD-10-CM | POA: Diagnosis not present

## 2018-10-25 DIAGNOSIS — E1151 Type 2 diabetes mellitus with diabetic peripheral angiopathy without gangrene: Secondary | ICD-10-CM | POA: Diagnosis not present

## 2018-10-25 DIAGNOSIS — E119 Type 2 diabetes mellitus without complications: Secondary | ICD-10-CM | POA: Diagnosis not present

## 2018-10-25 DIAGNOSIS — M542 Cervicalgia: Secondary | ICD-10-CM | POA: Diagnosis not present

## 2018-10-25 DIAGNOSIS — Z87891 Personal history of nicotine dependence: Secondary | ICD-10-CM | POA: Diagnosis not present

## 2018-11-23 DIAGNOSIS — Z03818 Encounter for observation for suspected exposure to other biological agents ruled out: Secondary | ICD-10-CM | POA: Diagnosis not present

## 2018-12-27 DIAGNOSIS — I480 Paroxysmal atrial fibrillation: Secondary | ICD-10-CM | POA: Diagnosis not present

## 2018-12-30 DIAGNOSIS — I5022 Chronic systolic (congestive) heart failure: Secondary | ICD-10-CM | POA: Diagnosis not present

## 2018-12-30 DIAGNOSIS — I447 Left bundle-branch block, unspecified: Secondary | ICD-10-CM | POA: Diagnosis not present

## 2018-12-30 DIAGNOSIS — E1151 Type 2 diabetes mellitus with diabetic peripheral angiopathy without gangrene: Secondary | ICD-10-CM | POA: Diagnosis not present

## 2018-12-30 DIAGNOSIS — I6523 Occlusion and stenosis of bilateral carotid arteries: Secondary | ICD-10-CM | POA: Diagnosis not present

## 2019-03-01 DIAGNOSIS — E538 Deficiency of other specified B group vitamins: Secondary | ICD-10-CM | POA: Diagnosis not present

## 2019-03-01 DIAGNOSIS — M1A00X0 Idiopathic chronic gout, unspecified site, without tophus (tophi): Secondary | ICD-10-CM | POA: Diagnosis not present

## 2019-03-01 DIAGNOSIS — E1151 Type 2 diabetes mellitus with diabetic peripheral angiopathy without gangrene: Secondary | ICD-10-CM | POA: Diagnosis not present

## 2019-03-08 DIAGNOSIS — E119 Type 2 diabetes mellitus without complications: Secondary | ICD-10-CM | POA: Diagnosis not present

## 2019-03-08 DIAGNOSIS — I6529 Occlusion and stenosis of unspecified carotid artery: Secondary | ICD-10-CM | POA: Diagnosis not present

## 2019-03-08 DIAGNOSIS — I6523 Occlusion and stenosis of bilateral carotid arteries: Secondary | ICD-10-CM | POA: Diagnosis not present

## 2019-03-08 DIAGNOSIS — E785 Hyperlipidemia, unspecified: Secondary | ICD-10-CM | POA: Diagnosis not present

## 2019-03-08 DIAGNOSIS — I429 Cardiomyopathy, unspecified: Secondary | ICD-10-CM | POA: Diagnosis not present

## 2019-03-08 DIAGNOSIS — Z87891 Personal history of nicotine dependence: Secondary | ICD-10-CM | POA: Diagnosis not present

## 2019-03-08 DIAGNOSIS — Z0000 Encounter for general adult medical examination without abnormal findings: Secondary | ICD-10-CM | POA: Diagnosis not present

## 2019-03-08 DIAGNOSIS — E538 Deficiency of other specified B group vitamins: Secondary | ICD-10-CM | POA: Diagnosis not present

## 2019-03-24 DIAGNOSIS — Z85828 Personal history of other malignant neoplasm of skin: Secondary | ICD-10-CM | POA: Diagnosis not present

## 2019-03-24 DIAGNOSIS — L218 Other seborrheic dermatitis: Secondary | ICD-10-CM | POA: Diagnosis not present

## 2019-03-24 DIAGNOSIS — D485 Neoplasm of uncertain behavior of skin: Secondary | ICD-10-CM | POA: Diagnosis not present

## 2019-03-24 DIAGNOSIS — Z872 Personal history of diseases of the skin and subcutaneous tissue: Secondary | ICD-10-CM | POA: Diagnosis not present

## 2019-03-24 DIAGNOSIS — C44222 Squamous cell carcinoma of skin of right ear and external auricular canal: Secondary | ICD-10-CM | POA: Diagnosis not present

## 2019-03-24 DIAGNOSIS — Z08 Encounter for follow-up examination after completed treatment for malignant neoplasm: Secondary | ICD-10-CM | POA: Diagnosis not present

## 2019-03-24 DIAGNOSIS — C44622 Squamous cell carcinoma of skin of right upper limb, including shoulder: Secondary | ICD-10-CM | POA: Diagnosis not present

## 2019-03-28 DIAGNOSIS — I480 Paroxysmal atrial fibrillation: Secondary | ICD-10-CM | POA: Diagnosis not present

## 2019-04-04 DIAGNOSIS — I5022 Chronic systolic (congestive) heart failure: Secondary | ICD-10-CM | POA: Diagnosis not present

## 2019-04-04 DIAGNOSIS — I447 Left bundle-branch block, unspecified: Secondary | ICD-10-CM | POA: Diagnosis not present

## 2019-04-04 DIAGNOSIS — E1151 Type 2 diabetes mellitus with diabetic peripheral angiopathy without gangrene: Secondary | ICD-10-CM | POA: Diagnosis not present

## 2019-04-04 DIAGNOSIS — I6523 Occlusion and stenosis of bilateral carotid arteries: Secondary | ICD-10-CM | POA: Diagnosis not present

## 2019-04-04 DIAGNOSIS — Z8673 Personal history of transient ischemic attack (TIA), and cerebral infarction without residual deficits: Secondary | ICD-10-CM | POA: Diagnosis not present

## 2019-04-04 DIAGNOSIS — J439 Emphysema, unspecified: Secondary | ICD-10-CM | POA: Diagnosis not present

## 2019-04-05 DIAGNOSIS — Z85828 Personal history of other malignant neoplasm of skin: Secondary | ICD-10-CM | POA: Diagnosis not present

## 2019-04-05 DIAGNOSIS — C44622 Squamous cell carcinoma of skin of right upper limb, including shoulder: Secondary | ICD-10-CM | POA: Diagnosis not present

## 2019-04-05 DIAGNOSIS — C44222 Squamous cell carcinoma of skin of right ear and external auricular canal: Secondary | ICD-10-CM | POA: Diagnosis not present

## 2019-05-12 DIAGNOSIS — E119 Type 2 diabetes mellitus without complications: Secondary | ICD-10-CM | POA: Diagnosis not present

## 2019-05-12 DIAGNOSIS — I509 Heart failure, unspecified: Secondary | ICD-10-CM | POA: Diagnosis not present

## 2019-05-12 DIAGNOSIS — Z87891 Personal history of nicotine dependence: Secondary | ICD-10-CM | POA: Diagnosis not present

## 2019-06-01 DIAGNOSIS — I509 Heart failure, unspecified: Secondary | ICD-10-CM | POA: Diagnosis not present

## 2019-06-01 DIAGNOSIS — E119 Type 2 diabetes mellitus without complications: Secondary | ICD-10-CM | POA: Diagnosis not present

## 2019-06-01 DIAGNOSIS — Z79899 Other long term (current) drug therapy: Secondary | ICD-10-CM | POA: Diagnosis not present

## 2019-07-20 DIAGNOSIS — I5022 Chronic systolic (congestive) heart failure: Secondary | ICD-10-CM | POA: Diagnosis not present

## 2019-07-20 DIAGNOSIS — I447 Left bundle-branch block, unspecified: Secondary | ICD-10-CM | POA: Diagnosis not present

## 2019-07-20 DIAGNOSIS — I480 Paroxysmal atrial fibrillation: Secondary | ICD-10-CM | POA: Diagnosis not present

## 2019-07-20 DIAGNOSIS — Z95810 Presence of automatic (implantable) cardiac defibrillator: Secondary | ICD-10-CM | POA: Diagnosis not present

## 2019-07-20 DIAGNOSIS — I428 Other cardiomyopathies: Secondary | ICD-10-CM | POA: Diagnosis not present

## 2019-07-20 DIAGNOSIS — Z4502 Encounter for adjustment and management of automatic implantable cardiac defibrillator: Secondary | ICD-10-CM | POA: Diagnosis not present

## 2019-07-26 DIAGNOSIS — E785 Hyperlipidemia, unspecified: Secondary | ICD-10-CM | POA: Diagnosis not present

## 2019-07-26 DIAGNOSIS — E119 Type 2 diabetes mellitus without complications: Secondary | ICD-10-CM | POA: Diagnosis not present

## 2019-07-26 DIAGNOSIS — I509 Heart failure, unspecified: Secondary | ICD-10-CM | POA: Diagnosis not present

## 2019-07-26 DIAGNOSIS — I429 Cardiomyopathy, unspecified: Secondary | ICD-10-CM | POA: Diagnosis not present

## 2019-07-26 DIAGNOSIS — I4891 Unspecified atrial fibrillation: Secondary | ICD-10-CM | POA: Diagnosis not present

## 2019-08-02 DIAGNOSIS — E876 Hypokalemia: Secondary | ICD-10-CM | POA: Diagnosis not present

## 2019-08-02 DIAGNOSIS — R7989 Other specified abnormal findings of blood chemistry: Secondary | ICD-10-CM | POA: Diagnosis not present

## 2019-08-30 DIAGNOSIS — Z45010 Encounter for checking and testing of cardiac pacemaker pulse generator [battery]: Secondary | ICD-10-CM | POA: Diagnosis not present

## 2019-08-30 DIAGNOSIS — I5022 Chronic systolic (congestive) heart failure: Secondary | ICD-10-CM | POA: Diagnosis not present

## 2019-08-30 DIAGNOSIS — I420 Dilated cardiomyopathy: Secondary | ICD-10-CM | POA: Diagnosis not present

## 2019-08-30 DIAGNOSIS — Z95810 Presence of automatic (implantable) cardiac defibrillator: Secondary | ICD-10-CM | POA: Diagnosis not present

## 2019-08-30 DIAGNOSIS — I447 Left bundle-branch block, unspecified: Secondary | ICD-10-CM | POA: Diagnosis not present

## 2019-08-30 DIAGNOSIS — I480 Paroxysmal atrial fibrillation: Secondary | ICD-10-CM | POA: Diagnosis not present

## 2019-08-30 DIAGNOSIS — Z8673 Personal history of transient ischemic attack (TIA), and cerebral infarction without residual deficits: Secondary | ICD-10-CM | POA: Diagnosis not present

## 2019-08-30 DIAGNOSIS — Z4502 Encounter for adjustment and management of automatic implantable cardiac defibrillator: Secondary | ICD-10-CM | POA: Diagnosis not present

## 2019-08-30 DIAGNOSIS — T85111A Breakdown (mechanical) of implanted electronic neurostimulator (electrode) of peripheral nerve, initial encounter: Secondary | ICD-10-CM | POA: Diagnosis not present

## 2019-08-30 DIAGNOSIS — I428 Other cardiomyopathies: Secondary | ICD-10-CM | POA: Diagnosis not present

## 2019-09-26 ENCOUNTER — Ambulatory Visit: Payer: Medicare HMO | Attending: Internal Medicine

## 2019-09-26 DIAGNOSIS — Z23 Encounter for immunization: Secondary | ICD-10-CM

## 2019-09-26 NOTE — Progress Notes (Signed)
° °  Covid-19 Vaccination Clinic  Name:  CEZAR MISIASZEK    MRN: [id number] DOB: [birth year]  09/26/2019  Mr. Wedig was observed post Covid-19 immunization for 15 minutes without incident. He was provided with Vaccine Information Sheet and instruction to access the V-Safe system.   Mr. Hulsebus was instructed to call 911 with any severe reactions post vaccine:  Difficulty breathing   Swelling of face and throat   A fast heartbeat   A bad rash all over body   Dizziness and weakness      Covid-19 Vaccination Clinic  Name:  CLEO VILLAMIZAR    MRN: [id number] DOB: [birth year]  09/26/2019  Mr. Maciver was observed post Covid-19 immunization for 15 minutes without incident. He was provided with Vaccine Information Sheet and instruction to access the V-Safe system.   Mr. Lottman was instructed to call 911 with any severe reactions post vaccine:  Difficulty breathing   Swelling of face and throat   A fast heartbeat   A bad rash all over body   Dizziness and weakness

## 2019-10-12 DIAGNOSIS — H00036 Abscess of eyelid left eye, unspecified eyelid: Secondary | ICD-10-CM | POA: Diagnosis not present

## 2019-10-12 DIAGNOSIS — L570 Actinic keratosis: Secondary | ICD-10-CM | POA: Diagnosis not present

## 2019-10-12 DIAGNOSIS — Z08 Encounter for follow-up examination after completed treatment for malignant neoplasm: Secondary | ICD-10-CM | POA: Diagnosis not present

## 2019-10-12 DIAGNOSIS — Z85828 Personal history of other malignant neoplasm of skin: Secondary | ICD-10-CM | POA: Diagnosis not present

## 2019-10-12 DIAGNOSIS — D485 Neoplasm of uncertain behavior of skin: Secondary | ICD-10-CM | POA: Diagnosis not present

## 2019-10-12 DIAGNOSIS — X32XXXA Exposure to sunlight, initial encounter: Secondary | ICD-10-CM | POA: Diagnosis not present

## 2019-10-12 DIAGNOSIS — Z872 Personal history of diseases of the skin and subcutaneous tissue: Secondary | ICD-10-CM | POA: Diagnosis not present

## 2019-10-12 DIAGNOSIS — L981 Factitial dermatitis: Secondary | ICD-10-CM | POA: Diagnosis not present

## 2019-11-21 DIAGNOSIS — R490 Dysphonia: Secondary | ICD-10-CM | POA: Diagnosis not present

## 2019-11-21 DIAGNOSIS — J984 Other disorders of lung: Secondary | ICD-10-CM | POA: Diagnosis not present

## 2019-12-26 DIAGNOSIS — I480 Paroxysmal atrial fibrillation: Secondary | ICD-10-CM | POA: Diagnosis not present

## 2020-01-02 DIAGNOSIS — D380 Neoplasm of uncertain behavior of larynx: Secondary | ICD-10-CM | POA: Diagnosis not present

## 2020-01-02 DIAGNOSIS — R490 Dysphonia: Secondary | ICD-10-CM | POA: Diagnosis not present

## 2020-01-19 DIAGNOSIS — E538 Deficiency of other specified B group vitamins: Secondary | ICD-10-CM | POA: Diagnosis not present

## 2020-01-19 DIAGNOSIS — E1151 Type 2 diabetes mellitus with diabetic peripheral angiopathy without gangrene: Secondary | ICD-10-CM | POA: Diagnosis not present

## 2020-01-19 DIAGNOSIS — I5022 Chronic systolic (congestive) heart failure: Secondary | ICD-10-CM | POA: Diagnosis not present

## 2020-01-25 ENCOUNTER — Encounter
Admission: RE | Admit: 2020-01-25 | Discharge: 2020-01-25 | Disposition: A | Discharge status: Home / Self Care | Payer: Medicare HMO | Source: Ambulatory Visit | Attending: Otolaryngology | Admitting: Otolaryngology

## 2020-01-25 ENCOUNTER — Other Ambulatory Visit: Payer: Self-pay

## 2020-01-25 HISTORY — DX: Gastro-esophageal reflux disease without esophagitis: K21.9

## 2020-01-25 HISTORY — DX: Unspecified atrial fibrillation: I48.91

## 2020-01-25 HISTORY — DX: Personal history of urinary calculi: Z87.442

## 2020-01-25 HISTORY — DX: Emphysema, unspecified: J43.9

## 2020-01-25 HISTORY — DX: Left bundle-branch block, unspecified: I44.7

## 2020-01-25 HISTORY — DX: Unspecified osteoarthritis, unspecified site: M19.90

## 2020-01-25 NOTE — Pre-Procedure Instructions (Signed)
ECG 12-lead  Component 4 mo ago  Vent Rate (bpm)  83   PR Interval (msec)  140   QRS Interval (msec)  140   QT Interval (msec)  442   QTc (msec)  519   Resulting Agency [facility] GE [facility]USE RESULTS   Narrative Performed by [place] This result has an attachment that is not available.  Atrial sensed, biventricular paced rhythm  Without magnet, Biventricular pacing, no pacing abnormalities  Predominance of paced beats precludes contour analysis  Abnormal ECG   When compared with ECG of 20-Jul-2019 13:56,  No significant change was found  I reviewed and concur with this report. Electronically signed [email or web address], MD, AUGUSTUS ([id number]) on 08/30/2019 3:43:38 PM Specimen Collected: 08/30/19 7:04 AM Last Resulted: 08/30/19 7:04 AM  Received From: [facility]  Result Received: 09/25/19 11:36 AM  View Encounter

## 2020-01-25 NOTE — Patient Instructions (Addendum)
Your procedure is scheduled on:01-31-20 WEDNESDAY Report to the Registration Desk on the 1st floor of the [facility]-Then proceed to the 2nd floor Surgery Desk in the [facility] To find out your arrival time, please call [phone] between 1PM - 3PM on:01-30-20 TUESDAY  REMEMBER: Instructions that are not followed completely may result in serious medical risk, up to and including death; or upon the discretion of your surgeon and anesthesiologist your surgery may need to be rescheduled.  Do not eat food after midnight the night before surgery.  No gum chewing, lozengers or hard candies.  You may however, drink WATER up to 2 hours before you are scheduled to arrive for your surgery. Do not drink anything within 2 hours of your scheduled arrival time.  Type 1 and Type 2 diabetics should only drink water.Marland Kitchen  TAKE THESE MEDICATIONS THE MORNING OF SURGERY WITH A SIP OF WATER: -COREG (CARVEDILOL) -LIPITOR (ATORVASATIN) -PROTONIX (PANTOPRAZOLE)-take one the night before and one on the morning of surgery - helps to prevent nausea after surgery.)  USE YOUR DUONEB NEBULIZER THE MORNING OF YOUR SURGERY  Follow recommendations from Cardiologist, Pulmonologist or PCP regarding stopping Aspirin, Coumadin, Plavix, Eliquis, Pradaxa, or Pletal-CALL DR BENNETT'S OFFICE TODAY REGARDING WHEN TO STOP YOUR 325 MG ASPIRIN  One week prior to surgery: Stop Anti-inflammatories (NSAIDS) such as Advil, Aleve, Ibuprofen, Motrin, Naproxen, Naprosyn and Aspirin based products such as Excedrin, Goodys Powder, BC Powder-TYLENOL OK TO TAKE IF NEEDED  Stop ANY OVER THE COUNTER supplements until after surgery. (However, you may continue taking Vitamin B12 up until the day before surgery.)  No Alcohol for 24 hours before or after surgery.  No Smoking including e-cigarettes for 24 hours prior to surgery.  No chewable tobacco products for at least 6 hours prior to surgery.  No nicotine patches on the day of  surgery.  Do not use any "recreational" drugs for at least a week prior to your surgery.  Please be advised that the combination of cocaine and anesthesia may have negative outcomes, up to and including death. If you test positive for cocaine, your surgery will be cancelled.  On the morning of surgery brush your teeth with toothpaste and water, you may rinse your mouth with mouthwash if you wish. Do not swallow any toothpaste or mouthwash.  Do not wear jewelry, make-up, hairpins, clips or nail polish.  Do not wear lotions, powders, or perfumes.   Do not shave body from the neck down 48 hours prior to surgery just in case you cut yourself which could leave a site for infection.  Also, freshly shaved skin may become irritated if using the CHG soap.  Contact lenses, hearing aids and dentures may not be worn into surgery.  Do not bring valuables to the hospital. [facility] is not responsible for any missing/lost belongings or valuables.   Notify your doctor if there is any change in your medical condition (cold, fever, infection).  Wear comfortable clothing (specific to your surgery type) to the hospital.  Plan for stool softeners for home use; pain medications have a tendency to cause constipation. You can also help prevent constipation by eating foods high in fiber such as fruits and vegetables and drinking plenty of fluids as your diet allows.  After surgery, you can help prevent lung complications by doing breathing exercises.  Take deep breaths and cough every 1-2 hours. Your doctor may order a device called an Incentive Spirometer to help you take deep breaths. When coughing or sneezing,  hold a pillow firmly against your incision with both hands. This is called "splinting." Doing this helps protect your incision. It also decreases belly discomfort.  If you are being admitted to the hospital overnight, leave your suitcase in the car. After surgery it may be brought to your  room.  If you are being discharged the day of surgery, you will not be allowed to drive home. You will need a responsible adult (18 years or older) to drive you home and stay with you that night.   If you are taking public transportation, you will need to have a responsible adult (18 years or older) with you. Please confirm with your physician that it is acceptable to use public transportation.   Please call the [place] Dept. at [phone] if you have any questions about these instructions.  Visitation Policy:  Patients undergoing a surgery or procedure may have one family member or support person with them as long as that person is not COVID-19 positive or experiencing its symptoms.  That person may remain in the waiting area during the procedure.  Inpatient Visitation:    Visiting hours are 7 a.m. to 8 p.m. Patients will be allowed one visitor. The visitor may change daily. The visitor must pass COVID-19 screenings, use hand sanitizer when entering and exiting the patient's room and wear a mask at all times, including in the patient's room. Patients must also wear a mask when staff or their visitor are in the room. Masking is required regardless of vaccination status. Systemwide, no visitors 17 or younger.

## 2020-01-26 ENCOUNTER — Encounter: Payer: Self-pay | Admitting: Otolaryngology

## 2020-01-26 DIAGNOSIS — I509 Heart failure, unspecified: Secondary | ICD-10-CM | POA: Diagnosis not present

## 2020-01-26 DIAGNOSIS — J449 Chronic obstructive pulmonary disease, unspecified: Secondary | ICD-10-CM | POA: Diagnosis not present

## 2020-01-26 DIAGNOSIS — E538 Deficiency of other specified B group vitamins: Secondary | ICD-10-CM | POA: Diagnosis not present

## 2020-01-26 DIAGNOSIS — E1151 Type 2 diabetes mellitus with diabetic peripheral angiopathy without gangrene: Secondary | ICD-10-CM | POA: Diagnosis not present

## 2020-01-26 DIAGNOSIS — D61818 Other pancytopenia: Secondary | ICD-10-CM | POA: Diagnosis not present

## 2020-01-26 DIAGNOSIS — I700 Atherosclerosis of aorta: Secondary | ICD-10-CM | POA: Diagnosis not present

## 2020-01-26 DIAGNOSIS — Z0000 Encounter for general adult medical examination without abnormal findings: Secondary | ICD-10-CM | POA: Diagnosis not present

## 2020-01-26 NOTE — Progress Notes (Signed)
[facility] Perioperative Services  Pre-Admission/Anesthesia Testing Clinical Review  Date: 01/29/20  Patient Demographics:  Name: Thomas Morton DOB:   [birth year] MRN:   [id number]  Planned Surgical Procedure(s):    Case: [id number] Date/Time: 01/31/20 0815   Procedure: MICRO DIRECT LARYNGOSCOPYWITH BIOPSY (N/A )   Anesthesia type: General   Pre-op diagnosis: laryngeal mass   Location: [facility] OR ROOM 09 / [place] [facility] FOR ANESTHESIA GROUP   Surgeons: Clyde Canterbury, MD    NOTE: Available PAT nursing documentation and vital signs have been reviewed. Clinical nursing staff has updated patient's PMH/PSHx, current medication list, and drug allergies/intolerances to ensure comprehensive history available to assist in medical decision making as it pertains to the aforementioned surgical procedure and anticipated anesthetic course.   Clinical Discussion:  Thomas Morton is a 87 y.o. male who is submitted for pre-surgical anesthesia review and clearance prior to him undergoing the above procedure. Patient is a Former Smoker (30 pack years; quit 01/1968). Pertinent PMH includes: atrial fibrillation, NICM (s/p AICD placement), LBBB, aortic atherosclerosis, carotid artery stenosis, CVA, CHF (NYHA III), HLD, DVT, T2DM, emphysema, GERD (on daily PPI), prostate cancer (s/p prostatectomy), OA  Patient is followed by cardiology Ubaldo Glassing, MD). He was last seen in the cardiology clinic on 04/04/2019; notes reviewed.  At the time of his clinic visit, patient noted to be doing "fairly well" overall from a cardiovascular perspective. He denied chest pain, shortness of breath, orthopnea, PND, palpitations, peripheral edema, vertiginous symptoms, and presyncope/syncope.  Patient has a history of an ICM and is s/p AICD placement. MD notes LVEF of 20%. Cardiac device is interrogated regularly with no evidence of treatment.  Patient with 4 months remaining on current pacemaker.  Referred back to Dr.  Westley Gambles for pulse generator change.  Last diagnostic left heart catheterization revealed no evidence of obstructive CAD.  Patient on GDMT for blood pressure and heart failure management.  Blood pressure well controlled at 110/64 on currently prescribed beta-blocker monotherapy.  Patient is on a statin for his HLD.  No other changes were made to patient medication regimen.  Patient to follow-up with outpatient cardiology in 3 months or sooner if needed.  Patient is scheduled to undergo ENT procedure on 01/31/2020 with Dr. Clyde Canterbury.  Given patient's past medical history significant for cardiovascular disease and intervention, presurgical cardiac clearance was sought by the PAT team. Per cardiology, "this patient is optimized for surgery and may proceed with the planned procedural course with a LOW risk stratification".  This patient is on daily antiplatelet therapy.  He has been instructed on recommendations from cardiology for holding his full dose ASA therapy for 5 days prior to his procedure with plans to restart as soon as deemed safe to do so by his primary attending surgeon.  Patient is aware that his last dose of ASA will be on 01/25/2020.  He denies previous perioperative complications with anesthesia. He underwent a MAC anesthetic course at [facility] (ASA IV) in 08/2019 with no documented complications.   Vitals with BMI 12/25/2017 12/25/2017 12/25/2017  Height - - -  Weight - - -  BMI - - -  Systolic 417 408 144  Diastolic 69 60 66  Pulse 83 95 84    Providers/Specialists:   NOTE: Primary physician provider listed below. Patient may have been seen by APP or partner within same practice.   PROVIDER ROLE / SPECIALTY LAST Toma Copier, MD Otolaryngology (Surgeon)  01/10/2020  Rusty Aus, MD Primary  Care Provider  11/21/2019  Bartholome Bill, MD Cardiology  04/04/2019   Allergies:  Sulfa antibiotics  Current Home Medications:   No current facility-administered medications for  this encounter.   Marland Kitchen allopurinol (ZYLOPRIM) 300 MG tablet  . aspirin EC 325 MG EC tablet  . atorvastatin (LIPITOR) 40 MG tablet  . betamethasone dipropionate (DIPROLENE) 0.05 % cream  . carvedilol (COREG) 3.125 MG tablet  . glimepiride (AMARYL) 1 MG tablet  . indapamide (LOZOL) 1.25 MG tablet  . ipratropium-albuterol (DUONEB) 0.5-2.5 (3) MG/3ML SOLN  . pantoprazole (PROTONIX) 40 MG tablet  . triamcinolone (KENALOG) 0.025 % cream  . vitamin B-12 (CYANOCOBALAMIN) 1000 MCG tablet  . budesonide (PULMICORT) 0.5 MG/2ML nebulizer solution   History:   Past Medical History:  Diagnosis Date  . Afib ([place])   . Aortic atherosclerosis ([place])   . Arthritis   . B12 deficiency   . Bilateral carotid artery stenosis   . Cancer [facility])    prostate 2001  . Cardiac defibrillator in situ   . CHF (congestive heart failure) ([place])   . Chronic gouty arthritis   . Diabetes mellitus without complication ([place])   . Diabetic sensorimotor neuropathy ([place])   . Emphysema lung ([place])   . GERD (gastroesophageal reflux disease)   . History of cardioembolic cerebrovascular accident (CVA)   . History of DVT (deep vein thrombosis)   . History of kidney stones    H/O  . History of prostatectomy   . Hyperlipidemia   . LBBB (left bundle branch block)   . NICM (nonischemic cardiomyopathy) [facility])    Past Surgical History:  Procedure Laterality Date  . CARDIAC DEFIBRILLATOR PLACEMENT    . eyelid surgery    . KNEE ARTHROSCOPY Bilateral   . PROSTATECTOMY     Family History  Problem Relation Age of Onset  . Throat cancer Mother   . Diabetes Father    Social History   Tobacco Use  . Smoking status: Former Smoker    Packs/day: 1.00    Years: 30.00    Pack years: 30.00    Types: Cigarettes    Quit date: 01/25/1968    Years since quitting: 52.0  . Smokeless tobacco: Never Used  Vaping Use  . Vaping Use: Never used  Substance Use Topics  . Alcohol use: Yes    Comment: WINE EVERYDAY  . Drug use: Never     Pertinent Clinical Results:  LABS: Labs reviewed: Acceptable for surgery.          ECG: Date: 08/30/2019 Rate: 83 bpm Rhythm:  atrial sensed, biventricular paced Intervals: PR 140 ms. QRS 140 ms. QTc 519 ms. ST segment and T wave changes: No evidence of acute ST segment elevation or depression Comparison: Similar to previous tracing obtained on 07/20/2019 NOTE: Tracing obtained at [facility]; unable for review. Above based on cardiologist's interpretation.    IMAGING / PROCEDURES: EP ICD GENERATOR REPLACEMENT performed on 08/30/2019 1. Explanted: DEVICE, VIVA XT CRT-D D-4 - [id number] Glynis Smiles XT CRT-D D-4 [id number] H [employer]  2. Implanted: Clayborn Heron MRI CRT-D D4 - [id number] Levell July MRI CRT-D D4 [id number] S [employer] CRM    3. Retained:   ATTAIN ABILITY 4296 88CM - [id number] V SCIP device LEAD, ATTAIN ABILITY 4296 88CM [id number] V [employer] CRM  PACE NOVUS CAPSUREFIX 5076 52CM - [id number] SCIP device LEAD, PACE [facility] CAPSUREFIX 5076 52CM [id number] [employer] CRM  SPRINT Hassell Halim 62CM - [id number] V SCIP device LEAD, SPRINT Ria Comment [id number] V [employer] CRM  4. Lead Interrogation Data:   RA: 0.9 mV / 380 ohms / 1 V @ 0.4 ms   RV: 10.9 mV / 399 ohms / 0.75 V @ 0.4 ms   LV:  456 ohms / 1 V @ 0.6 ms (LV ring to RV coil)   HV: 83 ohms  5. Device Programming:   DDD with lower rate limit 60   VF zone: 200   VT zone: 162 off   Monitor zone: 162 monitor   ECHOCARDIOGRAM performed on 10/15/2015 1. LVEF 40% 2. Diffuse LV hypokinesis 3. LA normal in size 4. Normal RV systolic pressure and cavity size 5. AV structurally normal with no regurgitation or stenosis 6. MV structurally normal with no regurgitation or stenosis 7. Trivial TV regurgitation  8. Aorta normal, not dilated, and non-diseased  CARDIAC MRI performed on 03/29/2012 1. The left ventricle is moderately enlarged in end-diastolic volume with normal wall  thickness.  2. Global systolic function is severely reduced with a quantitative LVEF of 18%.  3. There is moderate-severe diffuse hypokinesia with dysharmonic contraction of the septum as seen in bundle branch block. 4. LV volumes absolute and indexed to BSA with nearest age- and gender matched controls in parenthesis (mean, 95% CI) are listed below:  EDV: 253 ml (146, 105-187)  ESV: 208 ml (47, 25-70)  EDVI: 119 ml/m2 (75, 58-93)  ESVI: 98 ml/m2 (24, 13-35) 5. The right ventricle is normal in size. Global systolic function is normal. 6. The left atrium is mildly enlarged, the right atrium is normal insize. 7. The aortic valve is tricuspid without aortic stenosis, and with a peak velocity of 1.0 m/s on velocity encoded flow mapping.  8. There is mild tricuspid regurgitation.  9. No significant valvular disease is noted. 10. The aortic root and visualized portions of the tubular ascending aorta are normal in diameter. 11. Delayed contrast enhancement imaging is abnormal.  12. There is mid wall striae of fibrosis at the basal to mid septum as seen in patients with idiopathic dilated CMP or s/o myocarditis.  13. No hyper enhancement suggestive of myocardial infarction or infiltration. 14. Normal origin of the right and left coronary arteries.  15. Coronary sinus is visualized and appears normal in dimension, there is a middle cardiac vein and a posterior vein at approx. 4-5 o'clock position seen coming off the great cardiac vein. 16. No LV thrombus seen.  Impression and Plan:  Thomas Morton has been referred for pre-anesthesia review and clearance prior to him undergoing the planned anesthetic and procedural courses. Available labs, pertinent testing, and imaging results were personally reviewed by me. This patient has been appropriately cleared by cardiology with an overall LOW risk of significant perioperative cardiovascular complication.  Again, patient has an AICD in place.   Perioperative prescription for implanted cardiac device programming form has been completed by cardiology and placed on patient's chart for review by the surgical/anesthetic team.  Based on clinical review performed today (01/29/20), barring any significant acute changes in the patient's overall condition, it is anticipated that he will be able to proceed with the planned surgical intervention. Any acute changes in clinical condition may necessitate his procedure being postponed and/or cancelled. Pre-surgical instructions were reviewed with the patient during his PAT appointment and questions were fielded by PAT clinical staff.  Honor Loh, MSN, APRN, FNP-C, CEN [facility]  Peri-operative Services Nurse Practitioner Phone: [phone] 01/29/20 9:14 AM  NOTE: This note has been prepared using Dragon dictation software.  Despite my best ability to proofread, there is always the potential that unintentional transcriptional errors may still occur from this process.

## 2020-01-29 ENCOUNTER — Other Ambulatory Visit: Payer: Self-pay

## 2020-01-29 ENCOUNTER — Other Ambulatory Visit
Admission: RE | Admit: 2020-01-29 | Discharge: 2020-01-29 | Disposition: A | Discharge status: Home / Self Care | Payer: Medicare HMO | Source: Ambulatory Visit | Attending: Otolaryngology | Admitting: Otolaryngology

## 2020-01-29 DIAGNOSIS — Z01812 Encounter for preprocedural laboratory examination: Secondary | ICD-10-CM | POA: Diagnosis not present

## 2020-01-29 DIAGNOSIS — Z20822 Contact with and (suspected) exposure to covid-19: Secondary | ICD-10-CM | POA: Diagnosis not present

## 2020-01-29 LAB — SARS CORONAVIRUS 2 (TAT 6-24 HRS): SARS Coronavirus 2: NEGATIVE

## 2020-01-31 ENCOUNTER — Ambulatory Visit
Admission: RE | Admit: 2020-01-31 | Discharge: 2020-01-31 | Disposition: A | Discharge status: Home / Self Care | Payer: Medicare HMO | Source: Ambulatory Visit | Attending: Otolaryngology | Admitting: Otolaryngology

## 2020-01-31 ENCOUNTER — Encounter: Payer: Self-pay | Admitting: Urgent Care

## 2020-01-31 ENCOUNTER — Other Ambulatory Visit: Payer: Self-pay

## 2020-01-31 ENCOUNTER — Encounter
Admission: RE | Disposition: A | Discharge status: Home / Self Care | Payer: Self-pay | Source: Ambulatory Visit | Attending: Otolaryngology

## 2020-01-31 ENCOUNTER — Encounter: Payer: Self-pay | Admitting: Otolaryngology

## 2020-01-31 DIAGNOSIS — I428 Other cardiomyopathies: Secondary | ICD-10-CM | POA: Insufficient documentation

## 2020-01-31 DIAGNOSIS — Z7982 Long term (current) use of aspirin: Secondary | ICD-10-CM | POA: Diagnosis not present

## 2020-01-31 DIAGNOSIS — I6529 Occlusion and stenosis of unspecified carotid artery: Secondary | ICD-10-CM | POA: Diagnosis not present

## 2020-01-31 DIAGNOSIS — Z95810 Presence of automatic (implantable) cardiac defibrillator: Secondary | ICD-10-CM | POA: Diagnosis not present

## 2020-01-31 DIAGNOSIS — C320 Malignant neoplasm of glottis: Secondary | ICD-10-CM | POA: Insufficient documentation

## 2020-01-31 DIAGNOSIS — Z8546 Personal history of malignant neoplasm of prostate: Secondary | ICD-10-CM | POA: Insufficient documentation

## 2020-01-31 DIAGNOSIS — Z8673 Personal history of transient ischemic attack (TIA), and cerebral infarction without residual deficits: Secondary | ICD-10-CM | POA: Insufficient documentation

## 2020-01-31 DIAGNOSIS — Z79899 Other long term (current) drug therapy: Secondary | ICD-10-CM | POA: Diagnosis not present

## 2020-01-31 DIAGNOSIS — J383 Other diseases of vocal cords: Secondary | ICD-10-CM | POA: Diagnosis not present

## 2020-01-31 DIAGNOSIS — C329 Malignant neoplasm of larynx, unspecified: Secondary | ICD-10-CM | POA: Diagnosis not present

## 2020-01-31 DIAGNOSIS — Z87891 Personal history of nicotine dependence: Secondary | ICD-10-CM | POA: Diagnosis not present

## 2020-01-31 DIAGNOSIS — K219 Gastro-esophageal reflux disease without esophagitis: Secondary | ICD-10-CM | POA: Diagnosis not present

## 2020-01-31 DIAGNOSIS — I509 Heart failure, unspecified: Secondary | ICD-10-CM | POA: Insufficient documentation

## 2020-01-31 DIAGNOSIS — Z7984 Long term (current) use of oral hypoglycemic drugs: Secondary | ICD-10-CM | POA: Diagnosis not present

## 2020-01-31 DIAGNOSIS — Z86718 Personal history of other venous thrombosis and embolism: Secondary | ICD-10-CM | POA: Insufficient documentation

## 2020-01-31 DIAGNOSIS — I4891 Unspecified atrial fibrillation: Secondary | ICD-10-CM | POA: Diagnosis not present

## 2020-01-31 DIAGNOSIS — J439 Emphysema, unspecified: Secondary | ICD-10-CM | POA: Insufficient documentation

## 2020-01-31 DIAGNOSIS — D519 Vitamin B12 deficiency anemia, unspecified: Secondary | ICD-10-CM | POA: Diagnosis not present

## 2020-01-31 DIAGNOSIS — E785 Hyperlipidemia, unspecified: Secondary | ICD-10-CM | POA: Insufficient documentation

## 2020-01-31 DIAGNOSIS — I700 Atherosclerosis of aorta: Secondary | ICD-10-CM | POA: Diagnosis not present

## 2020-01-31 DIAGNOSIS — R42 Dizziness and giddiness: Secondary | ICD-10-CM | POA: Diagnosis not present

## 2020-01-31 DIAGNOSIS — E119 Type 2 diabetes mellitus without complications: Secondary | ICD-10-CM | POA: Diagnosis not present

## 2020-01-31 DIAGNOSIS — J387 Other diseases of larynx: Secondary | ICD-10-CM | POA: Diagnosis not present

## 2020-01-31 DIAGNOSIS — N189 Chronic kidney disease, unspecified: Secondary | ICD-10-CM | POA: Diagnosis not present

## 2020-01-31 HISTORY — DX: Personal history of transient ischemic attack (TIA), and cerebral infarction without residual deficits: Z86.73

## 2020-01-31 HISTORY — PX: DIRECT LARYNGOSCOPY: SHX5326

## 2020-01-31 HISTORY — DX: Deficiency of other specified B group vitamins: E53.8

## 2020-01-31 HISTORY — DX: Chronic gout, unspecified, without tophus (tophi): M1A.9XX0

## 2020-01-31 HISTORY — DX: Idiopathic chronic gout, unspecified site, without tophus (tophi): M1A.00X0

## 2020-01-31 HISTORY — DX: Personal history of other venous thrombosis and embolism: Z86.718

## 2020-01-31 HISTORY — DX: Presence of automatic (implantable) cardiac defibrillator: Z95.810

## 2020-01-31 HISTORY — DX: Other cardiomyopathies: I42.8

## 2020-01-31 HISTORY — DX: Type 2 diabetes mellitus with diabetic polyneuropathy: E11.42

## 2020-01-31 HISTORY — DX: Atherosclerosis of aorta: I70.0

## 2020-01-31 HISTORY — DX: Acquired absence of other genital organ(s): Z90.79

## 2020-01-31 HISTORY — DX: Occlusion and stenosis of bilateral carotid arteries: I65.23

## 2020-01-31 LAB — GLUCOSE, CAPILLARY
Glucose-Capillary: 145 mg/dL — ABNORMAL HIGH (ref 70–99)
Glucose-Capillary: 118 mg/dL — ABNORMAL HIGH (ref 70–99)

## 2020-01-31 SURGERY — LARYNGOSCOPY, DIRECT
Anesthesia: General | Site: Mouth

## 2020-01-31 MED ORDER — LIDOCAINE HCL (CARDIAC) PF 100 MG/5ML IV SOSY
PREFILLED_SYRINGE | INTRAVENOUS | Status: DC | PRN
  Administered 2020-01-31: 50 mg via INTRAVENOUS

## 2020-01-31 MED ORDER — SUGAMMADEX SODIUM 200 MG/2ML IV SOLN
INTRAVENOUS | Status: DC | PRN
  Administered 2020-01-31: 200 mg via INTRAVENOUS

## 2020-01-31 MED ORDER — ROCURONIUM BROMIDE 100 MG/10ML IV SOLN
INTRAVENOUS | Status: DC | PRN
  Administered 2020-01-31: 40 mg via INTRAVENOUS

## 2020-01-31 MED ORDER — ACETAMINOPHEN 10 MG/ML IV SOLN
INTRAVENOUS | Status: AC
  Filled 2020-01-31: qty 100

## 2020-01-31 MED ORDER — SODIUM CHLORIDE 0.9 % IV SOLN: INTRAVENOUS | Status: DC

## 2020-01-31 MED ORDER — OXYMETAZOLINE HCL 0.05 % NA SOLN
NASAL | Status: AC
  Filled 2020-01-31: qty 30

## 2020-01-31 MED ORDER — SUCCINYLCHOLINE CHLORIDE 20 MG/ML IJ SOLN
INTRAMUSCULAR | Status: DC | PRN
  Administered 2020-01-31: 100 mg via INTRAVENOUS

## 2020-01-31 MED ORDER — FENTANYL CITRATE (PF) 100 MCG/2ML IJ SOLN
INTRAMUSCULAR | Status: DC | PRN
  Administered 2020-01-31: 100 ug via INTRAVENOUS

## 2020-01-31 MED ORDER — FENTANYL CITRATE (PF) 100 MCG/2ML IJ SOLN: 25.0000 ug | INTRAMUSCULAR | Status: DC | PRN

## 2020-01-31 MED ORDER — LIDOCAINE-EPINEPHRINE (PF) 1 %-1:200000 IJ SOLN
INTRAMUSCULAR | Status: AC
  Filled 2020-01-31: qty 30

## 2020-01-31 MED ORDER — SUCCINYLCHOLINE CHLORIDE 200 MG/10ML IV SOSY
PREFILLED_SYRINGE | INTRAVENOUS | Status: AC
  Filled 2020-01-31: qty 10

## 2020-01-31 MED ORDER — CHLORHEXIDINE GLUCONATE 0.12 % MT SOLN
OROMUCOSAL | Status: AC
  Filled 2020-01-31: qty 15

## 2020-01-31 MED ORDER — DEXAMETHASONE SODIUM PHOSPHATE 10 MG/ML IJ SOLN
INTRAMUSCULAR | Status: AC
  Filled 2020-01-31: qty 1

## 2020-01-31 MED ORDER — PHENYLEPHRINE HCL (PRESSORS) 10 MG/ML IV SOLN
INTRAVENOUS | Status: DC | PRN
  Administered 2020-01-31 (×2): 200 ug via INTRAVENOUS

## 2020-01-31 MED ORDER — ONDANSETRON HCL 4 MG/2ML IJ SOLN: 4.0000 mg | Freq: Once | INTRAMUSCULAR | Status: DC | PRN

## 2020-01-31 MED ORDER — PROPOFOL 500 MG/50ML IV EMUL
INTRAVENOUS | Status: AC
  Filled 2020-01-31: qty 50

## 2020-01-31 MED ORDER — CHLORHEXIDINE GLUCONATE 0.12 % MT SOLN
15.0000 mL | Freq: Once | OROMUCOSAL | Status: AC
  Administered 2020-01-31: 15 mL via OROMUCOSAL

## 2020-01-31 MED ORDER — HYDROCODONE-ACETAMINOPHEN 5-325 MG PO TABS: 1.0000 | ORAL_TABLET | Freq: Four times a day (QID) | ORAL | 0 refills | Status: DC | PRN

## 2020-01-31 MED ORDER — ONDANSETRON HCL 4 MG/2ML IJ SOLN
INTRAMUSCULAR | Status: AC
  Filled 2020-01-31: qty 2

## 2020-01-31 MED ORDER — ACETAMINOPHEN 10 MG/ML IV SOLN
INTRAVENOUS | Status: DC | PRN
  Administered 2020-01-31: 1000 mg via INTRAVENOUS

## 2020-01-31 MED ORDER — LIDOCAINE HCL (PF) 2 % IJ SOLN
INTRAMUSCULAR | Status: AC
  Filled 2020-01-31: qty 5

## 2020-01-31 MED ORDER — LIDOCAINE-EPINEPHRINE 0.5 %-1:200000 IJ SOLN
INTRAMUSCULAR | Status: AC
  Filled 2020-01-31: qty 1

## 2020-01-31 MED ORDER — PROPOFOL 10 MG/ML IV BOLUS
INTRAVENOUS | Status: DC | PRN
Start: 2020-01-31 — End: 2020-01-31
  Administered 2020-01-31: 100 mg via INTRAVENOUS

## 2020-01-31 MED ORDER — ONDANSETRON HCL 4 MG/2ML IJ SOLN
INTRAMUSCULAR | Status: DC | PRN
  Administered 2020-01-31: 4 mg via INTRAVENOUS

## 2020-01-31 MED ORDER — ROCURONIUM BROMIDE 10 MG/ML (PF) SYRINGE
PREFILLED_SYRINGE | INTRAVENOUS | Status: AC
  Filled 2020-01-31: qty 10

## 2020-01-31 MED ORDER — ORAL CARE MOUTH RINSE: 15.0000 mL | Freq: Once | OROMUCOSAL | Status: AC

## 2020-01-31 MED ORDER — FENTANYL CITRATE (PF) 100 MCG/2ML IJ SOLN
INTRAMUSCULAR | Status: AC
  Filled 2020-01-31: qty 2

## 2020-01-31 MED ORDER — DEXAMETHASONE SODIUM PHOSPHATE 10 MG/ML IJ SOLN
INTRAMUSCULAR | Status: DC | PRN
  Administered 2020-01-31: 10 mg via INTRAVENOUS

## 2020-01-31 SURGICAL SUPPLY — 15 items
COVER WAND RF STERILE (DRAPES) ×2 IMPLANT
DRSG TELFA 4X3 1S NADH ST (GAUZE/BANDAGES/DRESSINGS) ×2 IMPLANT
GLOVE BIO SURGEON STRL SZ7.5 (GLOVE) ×2 IMPLANT
GOWN STRL REUS W/ TWL LRG LVL3 (GOWN DISPOSABLE) ×1 IMPLANT
GOWN STRL REUS W/TWL LRG LVL3 (GOWN DISPOSABLE) ×1
IV SET EXTENSION MINI BORE EPI (IV SETS) ×2 IMPLANT
LABEL OR SOLS (LABEL) ×2 IMPLANT
MANIFOLD NEPTUNE II (INSTRUMENTS) ×2 IMPLANT
NDL ENDOSCOPIC URO 20G (NEEDLE) ×2 IMPLANT
NEEDLE FILTER BLUNT 18X 1/2SAF (NEEDLE) ×1
NEEDLE FILTER BLUNT 18X1 1/2 (NEEDLE) ×1 IMPLANT
PACK HEAD/NECK (MISCELLANEOUS) ×2 IMPLANT
PATTIES SURGICAL .5 X.5 (GAUZE/BANDAGES/DRESSINGS) ×2 IMPLANT
SOL ANTI-FOG 6CC FOG-OUT (MISCELLANEOUS) ×1 IMPLANT
SOL FOG-OUT ANTI-FOG 6CC (MISCELLANEOUS) ×1

## 2020-01-31 NOTE — Anesthesia Preprocedure Evaluation (Signed)
Anesthesia Evaluation  Patient identified by MRN, date of birth, ID band Patient awake    Reviewed: Allergy & Precautions, H&P , NPO status , Patient's Chart, lab work & pertinent test results, reviewed documented beta blocker date and time   History of Anesthesia Complications Negative for: history of anesthetic complications  Airway Mallampati: III  TM Distance: >3 FB Neck ROM: full    Dental  (+) Dental Advidsory Given, Poor Dentition Permanent bridge on the top front:   Pulmonary neg shortness of breath, neg sleep apnea, COPD, neg recent URI, former smoker,    Pulmonary exam normal breath sounds clear to auscultation       Cardiovascular Exercise Tolerance: Good hypertension, (-) angina+CHF  (-) Past MI and (-) Cardiac Stents + dysrhythmias Atrial Fibrillation + Cardiac Defibrillator (-) Valvular Problems/Murmurs Rhythm:regular Rate:Normal     Neuro/Psych negative neurological ROS  negative psych ROS   GI/Hepatic Neg liver ROS, GERD  ,  Endo/Other  diabetes  Renal/GU Renal disease (h/o kidney stones)  negative genitourinary   Musculoskeletal   Abdominal   Peds  Hematology negative hematology ROS (+)   Anesthesia Other Findings Past Medical History: No date: Afib ([place]) No date: Aortic atherosclerosis (H[facility]) No date: Arthritis No date: B12 deficiency No date: Bilateral carotid artery stenosis No date: Cancer [facility])     Comment:  prostate 2001 No date: Cardiac defibrillator in situ No date: CHF (congestive heart failure) (HCC) No date: Chronic gouty arthritis No date: Diabetes mellitus without complication (HCC) No date: Diabetic sensorimotor neuropathy (HCC) No date: Emphysema lung (HCC) No date: GERD (gastroesophageal reflux disease) No date: History of cardioembolic cerebrovascular accident (CVA) No date: History of DVT (deep vein thrombosis) No date: History of kidney stones     Comment:  H/O No  date: History of prostatectomy No date: Hyperlipidemia No date: LBBB (left bundle branch block) No date: NICM (nonischemic cardiomyopathy) (HCC)   Reproductive/Obstetrics negative OB ROS                             Anesthesia Physical Anesthesia Plan  ASA: III  Anesthesia Plan: General   Post-op Pain Management:    Induction: Intravenous  PONV Risk Score and Plan: 2 and Ondansetron, Dexamethasone and Treatment may vary due to age or medical condition  Airway Management Planned: Oral ETT  Additional Equipment:   Intra-op Plan:   Post-operative Plan: Extubation in OR  Informed Consent: I have reviewed the patients History and Physical, chart, labs and discussed the procedure including the risks, benefits and alternatives for the proposed anesthesia with the patient or authorized representative who has indicated his/her understanding and acceptance.     Dental Advisory Given  Plan Discussed with: Anesthesiologist, CRNA and Surgeon  Anesthesia Plan Comments:         Anesthesia Quick Evaluation

## 2020-01-31 NOTE — H&P (Signed)
History and physical reviewed and will be scanned in later. No change in medical status reported by the patient or family, appears stable for surgery. All questions regarding the procedure answered, and patient (or family if a child) expressed understanding of the procedure. ? ?Thomas Morton [name] ?@TODAY@ ?

## 2020-01-31 NOTE — Transfer of Care (Signed)
Immediate Anesthesia Transfer of Care Note  Patient: Thomas Morton  Procedure(s) Performed: MICRO DIRECT LARYNGOSCOPYWITH BIOPSY (N/A Mouth)  Patient Location: PACU  Anesthesia Type:General  Level of Consciousness: awake  Airway & Oxygen Therapy: Patient Spontanous Breathing and Patient connected to face mask oxygen  Post-op Assessment: Report given to RN and Post -op Vital signs reviewed and stable  Post vital signs: Reviewed and stable  Last Vitals:  Vitals Value Taken Time  BP    Temp    Pulse    Resp    SpO2      Last Pain:  Vitals:   01/31/20 0800  TempSrc: Oral  PainSc: 0-No pain         Complications: No complications documented.

## 2020-01-31 NOTE — Anesthesia Procedure Notes (Signed)
Procedure Name: Intubation Performed by: Gaynelle Cage, CRNA Pre-anesthesia Checklist: Patient identified, Emergency Drugs available, Suction available and Patient being monitored Patient Re-evaluated:Patient Re-evaluated prior to induction Oxygen Delivery Method: Circle system utilized Preoxygenation: Pre-oxygenation with 100% oxygen Induction Type: IV induction Laryngoscope Size: Mac and 3 Grade View: Grade I Tube type: Oral Tube size: 6.0 mm Number of attempts: 1 Placement Confirmation: ETT inserted through vocal cords under direct vision,  positive ETCO2 and breath sounds checked- equal and bilateral Secured at: 21 cm Tube secured with: Tape Dental Injury: Teeth and Oropharynx as per pre-operative assessment

## 2020-01-31 NOTE — Op Note (Signed)
01/31/2020  9:09 AM    Thomas Morton  [id number]    Pre-Op Diagnosis: Vocal cord neoplasm  Post-op Diagnosis: Same  Procedure:  Microaryngoscopy with Biopsy  Surgeon:  Riley Nearing., MD  Anesthesia:  General Endotracheal  EBL:  Minimal  Complications:  None  Findings:  Friable lesion of the entire left TVC with extension 73mm subglottic. Right TVC dysplastic thickening with irregular mucosa and nodular mid cord lesion  Procedure: With the patient in a comfortable supine position, general endotracheal anesthesia was induced without difficulty.  At an appropriate level, the table was turned 90 degrees away from Anesthesia.  A clean preparation and draping was performed in the standard fashion. A tooth guard was placed. Using the Dedo laryngoscope, the oropharynx, hypopharynx and larynx were carefully inspected.  The scope was placed in the endolarynx and the patient placed into suspension with the Lewy arm. The 0 degree scope was used to obtain photodocumentation of the laryngeal lesions. The microscope was then used for better visualization.  Biopsies were taken from the right and left vocal cords and sent separately. Bleeding was controlled with Afrin moistened pledgets.  The findings were as described above.  The laryngoscope was removed.  At this point the procedure was completed.  The tooth guard was removed and dental status was intact.  The patient was returned to Anesthesia, awakened, extubated, and transferred to PACU in satisfactory condition.   Disposition: To PACU, then discharge home  Plan: Soft, bland diet, advance as tolerated. Take pain medications as prescribed. Follow-up in 3 weeks.  Riley Nearing 01/31/2020 9:09 AM

## 2020-01-31 NOTE — Discharge Instructions (Signed)

## 2020-02-01 ENCOUNTER — Other Ambulatory Visit: Payer: Self-pay | Admitting: Otolaryngology

## 2020-02-01 ENCOUNTER — Encounter: Payer: Self-pay | Admitting: Otolaryngology

## 2020-02-01 LAB — SURGICAL PATHOLOGY

## 2020-02-01 NOTE — Anesthesia Postprocedure Evaluation (Signed)
Anesthesia Post Note  Patient: DEMARIE HYNEMAN  Procedure(s) Performed: MICRO DIRECT LARYNGOSCOPYWITH BIOPSY (N/A Mouth)  Patient location during evaluation: PACU Anesthesia Type: General Level of consciousness: awake and alert Pain management: pain level controlled Vital Signs Assessment: post-procedure vital signs reviewed and stable Respiratory status: spontaneous breathing, nonlabored ventilation, respiratory function stable and patient connected to nasal cannula oxygen Cardiovascular status: blood pressure returned to baseline and stable Postop Assessment: no apparent nausea or vomiting Anesthetic complications: no   No complications documented.   Last Vitals:  Vitals:   01/31/20 1001 01/31/20 1022  BP: (!) 117/45 (!) 123/45  Pulse: (!) 58 (!) 59  Resp: 18 18  Temp: 36.7 C 36.8 C  SpO2: 94% 97%    Last Pain:  Vitals:   01/31/20 1022  TempSrc: Temporal  PainSc: 0-No pain                 Martha Clan

## 2020-02-08 ENCOUNTER — Other Ambulatory Visit: Payer: Medicare HMO

## 2020-02-08 NOTE — Progress Notes (Signed)
Tumor Board Documentation  Thomas Morton was presented by Dr Richardson Landry at our Tumor Board on 02/08/2020, which included representatives from medical oncology,radiation oncology,surgical,radiology,pathology,navigation,internal medicine,palliative care,genetics,pharmacy,pulmonology.  Thomas Morton currently presents as an external consult,for MDC,for new positive pathology with history of the following treatments: active survellience,surgical intervention(s).  Additionally, we reviewed previous medical and familial history, history of present illness, and recent lab results along with all available histopathologic and imaging studies. The tumor board considered available treatment options and made the following recommendations: Concurrent chemo-radiation therapy,Additional screening (PET Scan) Refer to Radiation and Medical Oncology  The following procedures/referrals were also placed: No orders of the defined types were placed in this encounter.   Clinical Trial Status: not discussed   Staging used: To be determined  AJCC Staging:       Group: Squamous cell CIS Vocal cord   National site-specific guidelines   were discussed with respect to the case.  Tumor board is a meeting of clinicians from various specialty areas who evaluate and discuss patients for whom a multidisciplinary approach is being considered. Final determinations in the plan of care are those of the provider(s). The responsibility for follow up of recommendations given during tumor board is that of the provider.   Today's extended care, comprehensive team conference, Thomas Morton was not present for the discussion and was not examined.   Multidisciplinary Tumor Board is a multidisciplinary case peer review process.  Decisions discussed in the Multidisciplinary Tumor Board reflect the opinions of the specialists present at the conference without having examined the patient.  Ultimately, treatment and diagnostic decisions rest with the  primary provider(s) and the patient.

## 2020-02-12 ENCOUNTER — Ambulatory Visit
Admission: RE | Admit: 2020-02-12 | Discharge: 2020-02-12 | Disposition: A | Discharge status: Home / Self Care | Payer: Medicare HMO | Source: Ambulatory Visit | Attending: Radiation Oncology | Admitting: Radiation Oncology

## 2020-02-12 ENCOUNTER — Other Ambulatory Visit: Payer: Self-pay | Admitting: Licensed Clinical Social Worker

## 2020-02-12 ENCOUNTER — Other Ambulatory Visit: Payer: Self-pay

## 2020-02-12 ENCOUNTER — Encounter: Payer: Self-pay | Admitting: Radiation Oncology

## 2020-02-12 VITALS — BP 136/64 | HR 86 | Temp 96.4°F | Wt 180.9 lb

## 2020-02-12 DIAGNOSIS — Z79899 Other long term (current) drug therapy: Secondary | ICD-10-CM | POA: Diagnosis not present

## 2020-02-12 DIAGNOSIS — E538 Deficiency of other specified B group vitamins: Secondary | ICD-10-CM | POA: Insufficient documentation

## 2020-02-12 DIAGNOSIS — I4891 Unspecified atrial fibrillation: Secondary | ICD-10-CM | POA: Diagnosis not present

## 2020-02-12 DIAGNOSIS — I509 Heart failure, unspecified: Secondary | ICD-10-CM | POA: Diagnosis not present

## 2020-02-12 DIAGNOSIS — Z808 Family history of malignant neoplasm of other organs or systems: Secondary | ICD-10-CM | POA: Diagnosis not present

## 2020-02-12 DIAGNOSIS — Z87891 Personal history of nicotine dependence: Secondary | ICD-10-CM | POA: Diagnosis not present

## 2020-02-12 DIAGNOSIS — K219 Gastro-esophageal reflux disease without esophagitis: Secondary | ICD-10-CM | POA: Insufficient documentation

## 2020-02-12 DIAGNOSIS — I447 Left bundle-branch block, unspecified: Secondary | ICD-10-CM | POA: Insufficient documentation

## 2020-02-12 DIAGNOSIS — Z86718 Personal history of other venous thrombosis and embolism: Secondary | ICD-10-CM | POA: Diagnosis not present

## 2020-02-12 DIAGNOSIS — E785 Hyperlipidemia, unspecified: Secondary | ICD-10-CM | POA: Insufficient documentation

## 2020-02-12 DIAGNOSIS — Z7951 Long term (current) use of inhaled steroids: Secondary | ICD-10-CM | POA: Diagnosis not present

## 2020-02-12 DIAGNOSIS — Z7982 Long term (current) use of aspirin: Secondary | ICD-10-CM | POA: Diagnosis not present

## 2020-02-12 DIAGNOSIS — M129 Arthropathy, unspecified: Secondary | ICD-10-CM | POA: Insufficient documentation

## 2020-02-12 DIAGNOSIS — Z87442 Personal history of urinary calculi: Secondary | ICD-10-CM | POA: Insufficient documentation

## 2020-02-12 DIAGNOSIS — J439 Emphysema, unspecified: Secondary | ICD-10-CM | POA: Insufficient documentation

## 2020-02-12 DIAGNOSIS — C329 Malignant neoplasm of larynx, unspecified: Secondary | ICD-10-CM

## 2020-02-12 DIAGNOSIS — E119 Type 2 diabetes mellitus without complications: Secondary | ICD-10-CM | POA: Insufficient documentation

## 2020-02-12 NOTE — Progress Notes (Signed)
Ct head 

## 2020-02-12 NOTE — Consult Note (Signed)
NEW PATIENT EVALUATION  Name: Thomas Morton  MRN: [id number]  Date:   02/12/2020     DOB: 05-03-1927   This 87 y.o. 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  REFERRING PHYSICIAN: Rusty Aus, MD  CHIEF COMPLAINT:  Chief Complaint  Patient presents with  . Consult    DIAGNOSIS: The encounter diagnosis was Larynx cancer ([place]).   PREVIOUS INVESTIGATIONS:  Pathology report reviewed Clinical notes reviewed CT scan of the head and neck ordered  HPI: Patient is a 87 year old male has had a several month history of increasing hoarseness.  He was seen by Dr. Richardson Landry who noted vocal cord neoplasm.  He underwent microlaryngoscopy with biopsy showing a friable lesion of the entire left true vocal cord with extension of 8 mm to the subglottic region.  The right TVC also was dysplastic with regular mucosa nodular mid cord lesion.  Biopsy was positive for squamous cell carcinoma.  He is having no dysphagia or head and neck pain.  He is accompanied by his son today and seen today for opinion.  PLANNED TREATMENT REGIMEN: External beam radiation therapy  PAST MEDICAL HISTORY:  has a past medical history of Afib ([place]), Aortic atherosclerosis ([place]), Arthritis, B12 deficiency, Bilateral carotid artery stenosis, Cancer ([place]), Cardiac defibrillator in situ, CHF (congestive heart failure) ([place]), Chronic gouty arthritis, Diabetes mellitus without complication ([place]), Diabetic sensorimotor neuropathy ([place]), Emphysema lung ([place]), GERD (gastroesophageal reflux disease), History of cardioembolic cerebrovascular accident (CVA), History of DVT (deep vein thrombosis), History of kidney stones, History of prostatectomy, Hyperlipidemia, LBBB (left bundle branch block), and NICM (nonischemic cardiomyopathy) ([place]).    PAST SURGICAL HISTORY:  Past Surgical History:  Procedure Laterality Date  . CARDIAC  DEFIBRILLATOR PLACEMENT    . DIRECT LARYNGOSCOPY N/A 01/31/2020   Procedure: MICRO DIRECT LARYNGOSCOPYWITH BIOPSY;  Surgeon: Clyde Canterbury, MD;  Location: [facility] ORS;  Service: ENT;  Laterality: N/A;  . eyelid surgery    . KNEE ARTHROSCOPY Bilateral   . PROSTATECTOMY      FAMILY HISTORY: family history includes Diabetes in his father; Throat cancer in his mother.  SOCIAL HISTORY:  reports that he quit smoking about 52 years ago. His smoking use included cigarettes. He has a 30.00 pack-year smoking history. He has never used smokeless tobacco. He reports current alcohol use. He reports that he does not use drugs.  ALLERGIES: Sulfa antibiotics  MEDICATIONS:  Current Outpatient Medications  Medication Sig Dispense Refill  . allopurinol (ZYLOPRIM) 300 MG tablet Take 300 mg by mouth at bedtime.    Marland Kitchen aspirin EC 325 MG EC tablet Take 1 tablet (325 mg total) by mouth daily. 30 tablet 0  . atorvastatin (LIPITOR) 40 MG tablet Take 1 tablet (40 mg total) by mouth daily at 6 PM. (Patient taking differently: Take 40 mg by mouth every morning.) 30 tablet 6  . betamethasone dipropionate (DIPROLENE) 0.05 % cream Apply 1 application topically 2 (two) times daily as needed (irritation).    . carvedilol (COREG) 3.125 MG tablet Take 3.125 mg by mouth 2 (two) times daily.    Marland Kitchen HYDROcodone-acetaminophen (NORCO/VICODIN) 5-325 MG tablet Take 1-2 tablets by mouth every 6 (six) hours as needed for moderate pain. 20 tablet 0  . indapamide (LOZOL) 1.25 MG tablet Take 1.25 mg by mouth every morning.    Marland Kitchen ipratropium-albuterol (DUONEB) 0.5-2.5 (3) MG/3ML SOLN Take 3 mLs by nebulization every 6 (six) hours. (Patient  taking differently: Take 3 mLs by nebulization every 6 (six) hours as needed (asthma).) 360 mL 0  . pantoprazole (PROTONIX) 40 MG tablet Take 1 tablet by mouth daily as needed (heartburn).    . triamcinolone (KENALOG) 0.025 % cream Apply 1 application topically daily as needed (irritation).    . vitamin B-12  (CYANOCOBALAMIN) 1000 MCG tablet Take 1,000 mcg by mouth daily.    . budesonide (PULMICORT) 0.5 MG/2ML nebulizer solution Take 2 mLs (0.5 mg total) by nebulization 2 (two) times daily. (Patient not taking: No sig reported) 2 mL 12  . glimepiride (AMARYL) 1 MG tablet Take 1 tablet (1 mg total) by mouth daily with breakfast. (Patient taking differently: Take 1 mg by mouth in the morning and at bedtime.) 30 tablet 0   No current facility-administered medications for this encounter.    ECOG PERFORMANCE STATUS:  0 - Asymptomatic  REVIEW OF SYSTEMS: Patient denies any weight loss, fatigue, weakness, fever, chills or night sweats. Patient denies any loss of vision, blurred vision. Patient denies any ringing  of the ears or hearing loss. No irregular heartbeat. Patient denies heart murmur or history of fainting. Patient denies any chest pain or pain radiating to her upper extremities. Patient denies any shortness of breath, difficulty breathing at night, cough or hemoptysis. Patient denies any swelling in the lower legs. Patient denies any nausea vomiting, vomiting of blood, or coffee ground material in the vomitus. Patient denies any stomach pain. Patient states has had normal bowel movements no significant constipation or diarrhea. Patient denies any dysuria, hematuria or significant nocturia. Patient denies any problems walking, swelling in the joints or loss of balance. Patient denies any skin changes, loss of hair or loss of weight. Patient denies any excessive worrying or anxiety or significant depression. Patient denies any problems with insomnia. Patient denies excessive thirst, polyuria, polydipsia. Patient denies any swollen glands, patient denies easy bruising or easy bleeding. Patient denies any recent infections, allergies or URI. Patient "s visual fields have not changed significantly in recent time.   PHYSICAL EXAM: BP 136/64   Pulse 86   Temp (!) 96.4 F (35.8 C) (Tympanic)   Wt 180 lb 14.4  oz (82.1 kg)   BMI 26.71 kg/m  No evidence of cervical adenopathy is identified.  Well-developed well-nourished patient in NAD. HEENT reveals PERLA, EOMI, discs not visualized.  Oral cavity is clear. No oral mucosal lesions are identified. Neck is clear without evidence of cervical or supraclavicular adenopathy. Lungs are clear to A&P. Cardiac examination is essentially unremarkable with regular rate and rhythm without murmur rub or thrill. Abdomen is benign with no organomegaly or masses noted. Motor sensory and DTR levels are equal and symmetric in the upper and lower extremities. Cranial nerves II through XII are grossly intact. Proprioception is intact. No peripheral adenopathy or edema is identified. No motor or sensory levels are noted. Crude visual fields are within normal range.  LABORATORY DATA: Pathology report reviewed    RADIOLOGY RESULTS: CT scan of the head and neck region ordered   IMPRESSION: Probable stage II squamous cell carcinoma larynx in 87 year old male  PLAN: At this time of ordered a CT scan of the head and neck to rule out any possibility of adenopathy.  Would plan on delivering 6600 centigrade to the larynx should there be no evidence of neck adenopathy.  Risks and benefits of treatment including increased hoarseness possible dysphagia fatigue skin reaction and permanent swelling of his neck all were described in detail to  the patient and his son they both seem to comprehend our treatment plan well.  CT scan was ordered I have set up a CT simulation after the CT scan is performed.  I would like to take this opportunity to thank you for allowing me to participate in the care of your patient.Noreene Filbert, MD

## 2020-02-16 ENCOUNTER — Other Ambulatory Visit: Payer: Self-pay

## 2020-02-16 ENCOUNTER — Ambulatory Visit
Admission: RE | Admit: 2020-02-16 | Discharge: 2020-02-16 | Disposition: A | Discharge status: Home / Self Care | Payer: Medicare HMO | Source: Ambulatory Visit | Attending: Radiation Oncology | Admitting: Radiation Oncology

## 2020-02-16 DIAGNOSIS — I6381 Other cerebral infarction due to occlusion or stenosis of small artery: Secondary | ICD-10-CM | POA: Diagnosis not present

## 2020-02-16 DIAGNOSIS — J383 Other diseases of vocal cords: Secondary | ICD-10-CM | POA: Diagnosis not present

## 2020-02-16 DIAGNOSIS — J3489 Other specified disorders of nose and nasal sinuses: Secondary | ICD-10-CM | POA: Diagnosis not present

## 2020-02-16 DIAGNOSIS — C329 Malignant neoplasm of larynx, unspecified: Secondary | ICD-10-CM | POA: Diagnosis not present

## 2020-02-16 DIAGNOSIS — H748X1 Other specified disorders of right middle ear and mastoid: Secondary | ICD-10-CM | POA: Diagnosis not present

## 2020-02-16 DIAGNOSIS — J392 Other diseases of pharynx: Secondary | ICD-10-CM | POA: Diagnosis not present

## 2020-02-16 MED ORDER — IOHEXOL 300 MG/ML  SOLN
75.0000 mL | Freq: Once | INTRAMUSCULAR | Status: AC | PRN
  Administered 2020-02-16: 75 mL via INTRAVENOUS

## 2020-02-20 ENCOUNTER — Ambulatory Visit
Admission: RE | Admit: 2020-02-20 | Discharge: 2020-02-20 | Disposition: A | Discharge status: Home / Self Care | Payer: Medicare HMO | Source: Ambulatory Visit | Attending: Radiation Oncology | Admitting: Radiation Oncology

## 2020-02-20 DIAGNOSIS — C329 Malignant neoplasm of larynx, unspecified: Secondary | ICD-10-CM | POA: Insufficient documentation

## 2020-02-20 DIAGNOSIS — Z510 Encounter for antineoplastic radiation therapy: Secondary | ICD-10-CM | POA: Insufficient documentation

## 2020-02-21 DIAGNOSIS — C329 Malignant neoplasm of larynx, unspecified: Secondary | ICD-10-CM | POA: Diagnosis not present

## 2020-02-23 ENCOUNTER — Other Ambulatory Visit: Payer: Self-pay | Admitting: *Deleted

## 2020-02-23 DIAGNOSIS — C329 Malignant neoplasm of larynx, unspecified: Secondary | ICD-10-CM

## 2020-02-23 DIAGNOSIS — Z510 Encounter for antineoplastic radiation therapy: Secondary | ICD-10-CM | POA: Diagnosis not present

## 2020-02-27 ENCOUNTER — Ambulatory Visit: Admission: RE | Admit: 2020-02-27 | Payer: Medicare HMO | Source: Ambulatory Visit

## 2020-02-27 DIAGNOSIS — C329 Malignant neoplasm of larynx, unspecified: Secondary | ICD-10-CM | POA: Diagnosis not present

## 2020-02-27 DIAGNOSIS — Z510 Encounter for antineoplastic radiation therapy: Secondary | ICD-10-CM | POA: Diagnosis not present

## 2020-02-28 ENCOUNTER — Ambulatory Visit
Admission: RE | Admit: 2020-02-28 | Discharge: 2020-02-28 | Disposition: A | Discharge status: Home / Self Care | Payer: Medicare HMO | Source: Ambulatory Visit | Attending: Radiation Oncology | Admitting: Radiation Oncology

## 2020-02-28 DIAGNOSIS — Z510 Encounter for antineoplastic radiation therapy: Secondary | ICD-10-CM | POA: Diagnosis not present

## 2020-02-28 DIAGNOSIS — C329 Malignant neoplasm of larynx, unspecified: Secondary | ICD-10-CM | POA: Diagnosis not present

## 2020-02-29 ENCOUNTER — Ambulatory Visit
Admission: RE | Admit: 2020-02-29 | Discharge: 2020-02-29 | Disposition: A | Discharge status: Home / Self Care | Payer: Medicare HMO | Source: Ambulatory Visit | Attending: Radiation Oncology | Admitting: Radiation Oncology

## 2020-02-29 DIAGNOSIS — Z510 Encounter for antineoplastic radiation therapy: Secondary | ICD-10-CM | POA: Diagnosis not present

## 2020-02-29 DIAGNOSIS — C329 Malignant neoplasm of larynx, unspecified: Secondary | ICD-10-CM | POA: Diagnosis not present

## 2020-03-01 ENCOUNTER — Ambulatory Visit
Admission: RE | Admit: 2020-03-01 | Discharge: 2020-03-01 | Disposition: A | Discharge status: Home / Self Care | Payer: Medicare HMO | Source: Ambulatory Visit | Attending: Radiation Oncology | Admitting: Radiation Oncology

## 2020-03-01 DIAGNOSIS — Z510 Encounter for antineoplastic radiation therapy: Secondary | ICD-10-CM | POA: Diagnosis not present

## 2020-03-01 DIAGNOSIS — C329 Malignant neoplasm of larynx, unspecified: Secondary | ICD-10-CM | POA: Diagnosis not present

## 2020-03-04 ENCOUNTER — Ambulatory Visit
Admission: RE | Admit: 2020-03-04 | Discharge: 2020-03-04 | Disposition: A | Discharge status: Home / Self Care | Payer: Medicare HMO | Source: Ambulatory Visit | Attending: Radiation Oncology | Admitting: Radiation Oncology

## 2020-03-04 DIAGNOSIS — Z510 Encounter for antineoplastic radiation therapy: Secondary | ICD-10-CM | POA: Diagnosis not present

## 2020-03-04 DIAGNOSIS — C329 Malignant neoplasm of larynx, unspecified: Secondary | ICD-10-CM | POA: Diagnosis not present

## 2020-03-05 ENCOUNTER — Ambulatory Visit
Admission: RE | Admit: 2020-03-05 | Discharge: 2020-03-05 | Disposition: A | Discharge status: Home / Self Care | Payer: Medicare HMO | Source: Ambulatory Visit | Attending: Radiation Oncology | Admitting: Radiation Oncology

## 2020-03-05 DIAGNOSIS — Z510 Encounter for antineoplastic radiation therapy: Secondary | ICD-10-CM | POA: Diagnosis not present

## 2020-03-05 DIAGNOSIS — C329 Malignant neoplasm of larynx, unspecified: Secondary | ICD-10-CM | POA: Diagnosis not present

## 2020-03-06 ENCOUNTER — Inpatient Hospital Stay: Payer: Medicare HMO | Attending: Radiation Oncology

## 2020-03-06 ENCOUNTER — Other Ambulatory Visit: Payer: Self-pay | Admitting: *Deleted

## 2020-03-06 ENCOUNTER — Ambulatory Visit
Admission: RE | Admit: 2020-03-06 | Discharge: 2020-03-06 | Disposition: A | Discharge status: Home / Self Care | Payer: Medicare HMO | Source: Ambulatory Visit | Attending: Radiation Oncology | Admitting: Radiation Oncology

## 2020-03-06 DIAGNOSIS — C329 Malignant neoplasm of larynx, unspecified: Secondary | ICD-10-CM | POA: Diagnosis not present

## 2020-03-06 DIAGNOSIS — Z510 Encounter for antineoplastic radiation therapy: Secondary | ICD-10-CM | POA: Diagnosis not present

## 2020-03-06 LAB — CBC
HCT: 27.5 % — ABNORMAL LOW (ref 39.0–52.0)
Hemoglobin: 9.4 g/dL — ABNORMAL LOW (ref 13.0–17.0)
MCH: 39.2 pg — ABNORMAL HIGH (ref 26.0–34.0)
MCHC: 34.2 g/dL (ref 30.0–36.0)
MCV: 114.6 fL — ABNORMAL HIGH (ref 80.0–100.0)
Platelets: 65 10*3/uL — ABNORMAL LOW (ref 150–400)
RBC: 2.4 MIL/uL — ABNORMAL LOW (ref 4.22–5.81)
RDW: 15.2 % (ref 11.5–15.5)
WBC: 1.2 10*3/uL — CL (ref 4.0–10.5)
nRBC: 0 % (ref 0.0–0.2)

## 2020-03-07 ENCOUNTER — Ambulatory Visit
Admission: RE | Admit: 2020-03-07 | Discharge: 2020-03-07 | Disposition: A | Discharge status: Home / Self Care | Payer: Medicare HMO | Source: Ambulatory Visit | Attending: Radiation Oncology | Admitting: Radiation Oncology

## 2020-03-07 DIAGNOSIS — Z510 Encounter for antineoplastic radiation therapy: Secondary | ICD-10-CM | POA: Diagnosis not present

## 2020-03-07 DIAGNOSIS — C329 Malignant neoplasm of larynx, unspecified: Secondary | ICD-10-CM | POA: Diagnosis not present

## 2020-03-08 ENCOUNTER — Ambulatory Visit
Admission: RE | Admit: 2020-03-08 | Discharge: 2020-03-08 | Disposition: A | Discharge status: Home / Self Care | Payer: Medicare HMO | Source: Ambulatory Visit | Attending: Radiation Oncology | Admitting: Radiation Oncology

## 2020-03-08 DIAGNOSIS — E872 Acidosis: Secondary | ICD-10-CM | POA: Diagnosis present

## 2020-03-08 DIAGNOSIS — I130 Hypertensive heart and chronic kidney disease with heart failure and stage 1 through stage 4 chronic kidney disease, or unspecified chronic kidney disease: Secondary | ICD-10-CM | POA: Diagnosis present

## 2020-03-08 DIAGNOSIS — E119 Type 2 diabetes mellitus without complications: Secondary | ICD-10-CM | POA: Diagnosis not present

## 2020-03-08 DIAGNOSIS — I509 Heart failure, unspecified: Secondary | ICD-10-CM | POA: Diagnosis not present

## 2020-03-08 DIAGNOSIS — R778 Other specified abnormalities of plasma proteins: Secondary | ICD-10-CM | POA: Diagnosis not present

## 2020-03-08 DIAGNOSIS — D539 Nutritional anemia, unspecified: Secondary | ICD-10-CM | POA: Diagnosis not present

## 2020-03-08 DIAGNOSIS — C329 Malignant neoplasm of larynx, unspecified: Secondary | ICD-10-CM | POA: Diagnosis present

## 2020-03-08 DIAGNOSIS — L89152 Pressure ulcer of sacral region, stage 2: Secondary | ICD-10-CM | POA: Diagnosis present

## 2020-03-08 DIAGNOSIS — Z20822 Contact with and (suspected) exposure to covid-19: Secondary | ICD-10-CM | POA: Diagnosis present

## 2020-03-08 DIAGNOSIS — D696 Thrombocytopenia, unspecified: Secondary | ICD-10-CM | POA: Diagnosis not present

## 2020-03-08 DIAGNOSIS — C9500 Acute leukemia of unspecified cell type not having achieved remission: Secondary | ICD-10-CM | POA: Diagnosis present

## 2020-03-08 DIAGNOSIS — R6521 Severe sepsis with septic shock: Secondary | ICD-10-CM | POA: Diagnosis present

## 2020-03-08 DIAGNOSIS — Z8673 Personal history of transient ischemic attack (TIA), and cerebral infarction without residual deficits: Secondary | ICD-10-CM | POA: Diagnosis not present

## 2020-03-08 DIAGNOSIS — Y95 Nosocomial condition: Secondary | ICD-10-CM | POA: Diagnosis present

## 2020-03-08 DIAGNOSIS — E538 Deficiency of other specified B group vitamins: Secondary | ICD-10-CM | POA: Diagnosis not present

## 2020-03-08 DIAGNOSIS — C9200 Acute myeloblastic leukemia, not having achieved remission: Secondary | ICD-10-CM | POA: Diagnosis not present

## 2020-03-08 DIAGNOSIS — J9601 Acute respiratory failure with hypoxia: Secondary | ICD-10-CM | POA: Diagnosis present

## 2020-03-08 DIAGNOSIS — N1831 Chronic kidney disease, stage 3a: Secondary | ICD-10-CM | POA: Diagnosis present

## 2020-03-08 DIAGNOSIS — K219 Gastro-esophageal reflux disease without esophagitis: Secondary | ICD-10-CM | POA: Diagnosis present

## 2020-03-08 DIAGNOSIS — Z66 Do not resuscitate: Secondary | ICD-10-CM | POA: Diagnosis present

## 2020-03-08 DIAGNOSIS — I482 Chronic atrial fibrillation, unspecified: Secondary | ICD-10-CM | POA: Diagnosis present

## 2020-03-08 DIAGNOSIS — R6520 Severe sepsis without septic shock: Secondary | ICD-10-CM | POA: Diagnosis not present

## 2020-03-08 DIAGNOSIS — A419 Sepsis, unspecified organism: Secondary | ICD-10-CM | POA: Diagnosis present

## 2020-03-08 DIAGNOSIS — I5022 Chronic systolic (congestive) heart failure: Secondary | ICD-10-CM | POA: Diagnosis present

## 2020-03-08 DIAGNOSIS — I639 Cerebral infarction, unspecified: Secondary | ICD-10-CM | POA: Diagnosis not present

## 2020-03-08 DIAGNOSIS — I420 Dilated cardiomyopathy: Secondary | ICD-10-CM | POA: Diagnosis present

## 2020-03-08 DIAGNOSIS — I248 Other forms of acute ischemic heart disease: Secondary | ICD-10-CM | POA: Diagnosis present

## 2020-03-08 DIAGNOSIS — I4891 Unspecified atrial fibrillation: Secondary | ICD-10-CM | POA: Diagnosis not present

## 2020-03-08 DIAGNOSIS — M1A9XX0 Chronic gout, unspecified, without tophus (tophi): Secondary | ICD-10-CM | POA: Diagnosis present

## 2020-03-08 DIAGNOSIS — E785 Hyperlipidemia, unspecified: Secondary | ICD-10-CM | POA: Diagnosis present

## 2020-03-08 DIAGNOSIS — E1169 Type 2 diabetes mellitus with other specified complication: Secondary | ICD-10-CM | POA: Diagnosis present

## 2020-03-08 DIAGNOSIS — D61818 Other pancytopenia: Secondary | ICD-10-CM | POA: Diagnosis present

## 2020-03-08 DIAGNOSIS — R0602 Shortness of breath: Secondary | ICD-10-CM | POA: Diagnosis present

## 2020-03-08 DIAGNOSIS — J690 Pneumonitis due to inhalation of food and vomit: Secondary | ICD-10-CM | POA: Diagnosis not present

## 2020-03-08 DIAGNOSIS — Z86718 Personal history of other venous thrombosis and embolism: Secondary | ICD-10-CM | POA: Diagnosis not present

## 2020-03-08 DIAGNOSIS — E1122 Type 2 diabetes mellitus with diabetic chronic kidney disease: Secondary | ICD-10-CM | POA: Diagnosis present

## 2020-03-08 DIAGNOSIS — R509 Fever, unspecified: Secondary | ICD-10-CM | POA: Diagnosis not present

## 2020-03-08 DIAGNOSIS — I10 Essential (primary) hypertension: Secondary | ICD-10-CM | POA: Diagnosis not present

## 2020-03-08 DIAGNOSIS — N179 Acute kidney failure, unspecified: Secondary | ICD-10-CM | POA: Diagnosis present

## 2020-03-08 DIAGNOSIS — J189 Pneumonia, unspecified organism: Secondary | ICD-10-CM | POA: Diagnosis present

## 2020-03-11 ENCOUNTER — Other Ambulatory Visit: Payer: Self-pay

## 2020-03-11 ENCOUNTER — Emergency Department: Payer: Medicare HMO

## 2020-03-11 ENCOUNTER — Ambulatory Visit: Payer: Medicare HMO

## 2020-03-11 ENCOUNTER — Inpatient Hospital Stay
Admission: EM | Admit: 2020-03-11 | Discharge: 2020-03-14 | DRG: 871 | Disposition: A | Discharge status: Other Acute Inpatient Hospital | Payer: Medicare HMO | Attending: Internal Medicine | Admitting: Internal Medicine

## 2020-03-11 DIAGNOSIS — I482 Chronic atrial fibrillation, unspecified: Secondary | ICD-10-CM | POA: Diagnosis present

## 2020-03-11 DIAGNOSIS — C329 Malignant neoplasm of larynx, unspecified: Secondary | ICD-10-CM | POA: Diagnosis present

## 2020-03-11 DIAGNOSIS — A419 Sepsis, unspecified organism: Principal | ICD-10-CM | POA: Diagnosis present

## 2020-03-11 DIAGNOSIS — M1A00X0 Idiopathic chronic gout, unspecified site, without tophus (tophi): Secondary | ICD-10-CM | POA: Diagnosis present

## 2020-03-11 DIAGNOSIS — E872 Acidosis: Secondary | ICD-10-CM | POA: Diagnosis present

## 2020-03-11 DIAGNOSIS — Z86718 Personal history of other venous thrombosis and embolism: Secondary | ICD-10-CM | POA: Diagnosis not present

## 2020-03-11 DIAGNOSIS — J9601 Acute respiratory failure with hypoxia: Secondary | ICD-10-CM | POA: Diagnosis present

## 2020-03-11 DIAGNOSIS — D61818 Other pancytopenia: Secondary | ICD-10-CM | POA: Diagnosis present

## 2020-03-11 DIAGNOSIS — M1A9XX0 Chronic gout, unspecified, without tophus (tophi): Secondary | ICD-10-CM | POA: Diagnosis present

## 2020-03-11 DIAGNOSIS — Z66 Do not resuscitate: Secondary | ICD-10-CM | POA: Diagnosis present

## 2020-03-11 DIAGNOSIS — J189 Pneumonia, unspecified organism: Secondary | ICD-10-CM

## 2020-03-11 DIAGNOSIS — C9500 Acute leukemia of unspecified cell type not having achieved remission: Secondary | ICD-10-CM | POA: Diagnosis present

## 2020-03-11 DIAGNOSIS — I447 Left bundle-branch block, unspecified: Secondary | ICD-10-CM | POA: Diagnosis present

## 2020-03-11 DIAGNOSIS — I639 Cerebral infarction, unspecified: Secondary | ICD-10-CM | POA: Diagnosis present

## 2020-03-11 DIAGNOSIS — R778 Other specified abnormalities of plasma proteins: Secondary | ICD-10-CM | POA: Diagnosis not present

## 2020-03-11 DIAGNOSIS — K219 Gastro-esophageal reflux disease without esophagitis: Secondary | ICD-10-CM | POA: Diagnosis present

## 2020-03-11 DIAGNOSIS — R6520 Severe sepsis without septic shock: Secondary | ICD-10-CM

## 2020-03-11 DIAGNOSIS — R0602 Shortness of breath: Secondary | ICD-10-CM

## 2020-03-11 DIAGNOSIS — D696 Thrombocytopenia, unspecified: Secondary | ICD-10-CM

## 2020-03-11 DIAGNOSIS — Z7951 Long term (current) use of inhaled steroids: Secondary | ICD-10-CM

## 2020-03-11 DIAGNOSIS — D539 Nutritional anemia, unspecified: Secondary | ICD-10-CM | POA: Diagnosis present

## 2020-03-11 DIAGNOSIS — I5022 Chronic systolic (congestive) heart failure: Secondary | ICD-10-CM

## 2020-03-11 DIAGNOSIS — I700 Atherosclerosis of aorta: Secondary | ICD-10-CM | POA: Diagnosis present

## 2020-03-11 DIAGNOSIS — Y95 Nosocomial condition: Secondary | ICD-10-CM | POA: Diagnosis present

## 2020-03-11 DIAGNOSIS — I4891 Unspecified atrial fibrillation: Secondary | ICD-10-CM

## 2020-03-11 DIAGNOSIS — E785 Hyperlipidemia, unspecified: Secondary | ICD-10-CM | POA: Diagnosis present

## 2020-03-11 DIAGNOSIS — I130 Hypertensive heart and chronic kidney disease with heart failure and stage 1 through stage 4 chronic kidney disease, or unspecified chronic kidney disease: Secondary | ICD-10-CM | POA: Diagnosis present

## 2020-03-11 DIAGNOSIS — B9789 Other viral agents as the cause of diseases classified elsewhere: Secondary | ICD-10-CM | POA: Diagnosis present

## 2020-03-11 DIAGNOSIS — Z87891 Personal history of nicotine dependence: Secondary | ICD-10-CM

## 2020-03-11 DIAGNOSIS — Z923 Personal history of irradiation: Secondary | ICD-10-CM

## 2020-03-11 DIAGNOSIS — N189 Chronic kidney disease, unspecified: Secondary | ICD-10-CM | POA: Diagnosis present

## 2020-03-11 DIAGNOSIS — E1169 Type 2 diabetes mellitus with other specified complication: Secondary | ICD-10-CM

## 2020-03-11 DIAGNOSIS — I10 Essential (primary) hypertension: Secondary | ICD-10-CM | POA: Diagnosis present

## 2020-03-11 DIAGNOSIS — E860 Dehydration: Secondary | ICD-10-CM | POA: Diagnosis present

## 2020-03-11 DIAGNOSIS — R509 Fever, unspecified: Secondary | ICD-10-CM

## 2020-03-11 DIAGNOSIS — J439 Emphysema, unspecified: Secondary | ICD-10-CM | POA: Diagnosis present

## 2020-03-11 DIAGNOSIS — N179 Acute kidney failure, unspecified: Secondary | ICD-10-CM | POA: Diagnosis present

## 2020-03-11 DIAGNOSIS — J690 Pneumonitis due to inhalation of food and vomit: Secondary | ICD-10-CM | POA: Diagnosis present

## 2020-03-11 DIAGNOSIS — E119 Type 2 diabetes mellitus without complications: Secondary | ICD-10-CM

## 2020-03-11 DIAGNOSIS — Z79899 Other long term (current) drug therapy: Secondary | ICD-10-CM

## 2020-03-11 DIAGNOSIS — I420 Dilated cardiomyopathy: Secondary | ICD-10-CM | POA: Diagnosis present

## 2020-03-11 DIAGNOSIS — Z7984 Long term (current) use of oral hypoglycemic drugs: Secondary | ICD-10-CM

## 2020-03-11 DIAGNOSIS — E538 Deficiency of other specified B group vitamins: Secondary | ICD-10-CM | POA: Diagnosis not present

## 2020-03-11 DIAGNOSIS — N1831 Chronic kidney disease, stage 3a: Secondary | ICD-10-CM | POA: Diagnosis present

## 2020-03-11 DIAGNOSIS — Z95810 Presence of automatic (implantable) cardiac defibrillator: Secondary | ICD-10-CM

## 2020-03-11 DIAGNOSIS — E1122 Type 2 diabetes mellitus with diabetic chronic kidney disease: Secondary | ICD-10-CM | POA: Diagnosis present

## 2020-03-11 DIAGNOSIS — I248 Other forms of acute ischemic heart disease: Secondary | ICD-10-CM | POA: Diagnosis present

## 2020-03-11 DIAGNOSIS — R6521 Severe sepsis with septic shock: Secondary | ICD-10-CM | POA: Diagnosis present

## 2020-03-11 DIAGNOSIS — Z7982 Long term (current) use of aspirin: Secondary | ICD-10-CM

## 2020-03-11 DIAGNOSIS — E1129 Type 2 diabetes mellitus with other diabetic kidney complication: Secondary | ICD-10-CM | POA: Diagnosis present

## 2020-03-11 DIAGNOSIS — Z8546 Personal history of malignant neoplasm of prostate: Secondary | ICD-10-CM

## 2020-03-11 DIAGNOSIS — Z882 Allergy status to sulfonamides status: Secondary | ICD-10-CM

## 2020-03-11 DIAGNOSIS — R7989 Other specified abnormal findings of blood chemistry: Secondary | ICD-10-CM | POA: Diagnosis present

## 2020-03-11 DIAGNOSIS — C9200 Acute myeloblastic leukemia, not having achieved remission: Secondary | ICD-10-CM

## 2020-03-11 DIAGNOSIS — Z8673 Personal history of transient ischemic attack (TIA), and cerebral infarction without residual deficits: Secondary | ICD-10-CM

## 2020-03-11 DIAGNOSIS — E1140 Type 2 diabetes mellitus with diabetic neuropathy, unspecified: Secondary | ICD-10-CM | POA: Diagnosis present

## 2020-03-11 DIAGNOSIS — Z20822 Contact with and (suspected) exposure to covid-19: Secondary | ICD-10-CM | POA: Diagnosis present

## 2020-03-11 DIAGNOSIS — Z833 Family history of diabetes mellitus: Secondary | ICD-10-CM

## 2020-03-11 DIAGNOSIS — Z808 Family history of malignant neoplasm of other organs or systems: Secondary | ICD-10-CM

## 2020-03-11 DIAGNOSIS — I509 Heart failure, unspecified: Secondary | ICD-10-CM

## 2020-03-11 DIAGNOSIS — L89152 Pressure ulcer of sacral region, stage 2: Secondary | ICD-10-CM | POA: Diagnosis present

## 2020-03-11 DIAGNOSIS — L8990 Pressure ulcer of unspecified site, unspecified stage: Secondary | ICD-10-CM | POA: Insufficient documentation

## 2020-03-11 LAB — RESP PANEL BY RT-PCR (FLU A&B, COVID) ARPGX2
Influenza A by PCR: NEGATIVE
Influenza B by PCR: NEGATIVE
SARS Coronavirus 2 by RT PCR: NEGATIVE

## 2020-03-11 LAB — URINALYSIS, COMPLETE (UACMP) WITH MICROSCOPIC
Bacteria, UA: NONE SEEN
Bilirubin Urine: NEGATIVE
Glucose, UA: 50 mg/dL — AB
Hgb urine dipstick: NEGATIVE
Ketones, ur: 5 mg/dL — AB
Leukocytes,Ua: NEGATIVE
Nitrite: NEGATIVE
Protein, ur: 30 mg/dL — AB
Specific Gravity, Urine: 1.028 (ref 1.005–1.030)
pH: 5 (ref 5.0–8.0)

## 2020-03-11 LAB — COMPREHENSIVE METABOLIC PANEL
ALT: 10 U/L (ref 0–44)
AST: 22 U/L (ref 15–41)
Albumin: 3.4 g/dL — ABNORMAL LOW (ref 3.5–5.0)
Alkaline Phosphatase: 62 U/L (ref 38–126)
Anion gap: 12 (ref 5–15)
BUN: 28 mg/dL — ABNORMAL HIGH (ref 8–23)
CO2: 23 mmol/L (ref 22–32)
Calcium: 8.7 mg/dL — ABNORMAL LOW (ref 8.9–10.3)
Chloride: 101 mmol/L (ref 98–111)
Creatinine, Ser: 2.03 mg/dL — ABNORMAL HIGH (ref 0.61–1.24)
GFR, Estimated: 31 mL/min — ABNORMAL LOW (ref >60)
Glucose, Bld: 263 mg/dL — ABNORMAL HIGH (ref 70–99)
Potassium: 4.5 mmol/L (ref 3.5–5.1)
Sodium: 136 mmol/L (ref 135–145)
Total Bilirubin: 1.1 mg/dL (ref 0.3–1.2)
Total Protein: 6.7 g/dL (ref 6.5–8.1)

## 2020-03-11 LAB — CBC WITH DIFFERENTIAL/PLATELET
Abs Immature Granulocytes: 0.06 10*3/uL (ref 0.00–0.07)
Basophils Absolute: 0 10*3/uL (ref 0.0–0.1)
Basophils Relative: 0 %
Eosinophils Absolute: 0 10*3/uL (ref 0.0–0.5)
Eosinophils Relative: 4 %
HCT: 26.7 % — ABNORMAL LOW (ref 39.0–52.0)
Hemoglobin: 9 g/dL — ABNORMAL LOW (ref 13.0–17.0)
Immature Granulocytes: 6 %
Lymphocytes Relative: 37 %
Lymphs Abs: 0.4 10*3/uL — ABNORMAL LOW (ref 0.7–4.0)
MCH: 38.3 pg — ABNORMAL HIGH (ref 26.0–34.0)
MCHC: 33.7 g/dL (ref 30.0–36.0)
MCV: 113.6 fL — ABNORMAL HIGH (ref 80.0–100.0)
Monocytes Absolute: 0.2 10*3/uL (ref 0.1–1.0)
Monocytes Relative: 22 %
Neutro Abs: 0.3 10*3/uL — CL (ref 1.7–7.7)
Neutrophils Relative %: 31 %
Platelets: 75 10*3/uL — ABNORMAL LOW (ref 150–400)
RBC: 2.35 MIL/uL — ABNORMAL LOW (ref 4.22–5.81)
RDW: 15.2 % (ref 11.5–15.5)
WBC: 1.1 10*3/uL — CL (ref 4.0–10.5)
nRBC: 1.9 % — ABNORMAL HIGH (ref 0.0–0.2)

## 2020-03-11 LAB — MRSA PCR SCREENING: MRSA by PCR: NEGATIVE

## 2020-03-11 LAB — BLOOD GAS, VENOUS
Acid-Base Excess: 0.1 mmol/L (ref 0.0–2.0)
Bicarbonate: 24.7 mmol/L (ref 20.0–28.0)
O2 Saturation: 82.1 %
Patient temperature: 37
pCO2, Ven: 39 mmHg — ABNORMAL LOW (ref 44.0–60.0)
pH, Ven: 7.41 (ref 7.250–7.430)
pO2, Ven: 46 mmHg — ABNORMAL HIGH (ref 32.0–45.0)

## 2020-03-11 LAB — LACTIC ACID, PLASMA
Lactic Acid, Venous: 3.3 mmol/L (ref 0.5–1.9)
Lactic Acid, Venous: 2.5 mmol/L (ref 0.5–1.9)
Lactic Acid, Venous: 2.1 mmol/L (ref 0.5–1.9)

## 2020-03-11 LAB — GLUCOSE, CAPILLARY: Glucose-Capillary: 152 mg/dL — ABNORMAL HIGH (ref 70–99)

## 2020-03-11 LAB — CBG MONITORING, ED: Glucose-Capillary: 166 mg/dL — ABNORMAL HIGH (ref 70–99)

## 2020-03-11 LAB — PROCALCITONIN: Procalcitonin: 2.2 ng/mL

## 2020-03-11 LAB — TROPONIN I (HIGH SENSITIVITY)
Troponin I (High Sensitivity): 42 ng/L — ABNORMAL HIGH (ref <18)
Troponin I (High Sensitivity): 125 ng/L (ref <18)
Troponin I (High Sensitivity): 122 ng/L (ref <18)

## 2020-03-11 LAB — PATHOLOGIST SMEAR REVIEW

## 2020-03-11 LAB — BRAIN NATRIURETIC PEPTIDE: B Natriuretic Peptide: 119.2 pg/mL — ABNORMAL HIGH (ref 0.0–100.0)

## 2020-03-11 MED ORDER — SODIUM CHLORIDE 0.9 % IV BOLUS
1000.0000 mL | Freq: Once | INTRAVENOUS | Status: AC
  Administered 2020-03-11: 1000 mL via INTRAVENOUS

## 2020-03-11 MED ORDER — ACETAMINOPHEN 325 MG PO TABS
650.0000 mg | ORAL_TABLET | Freq: Four times a day (QID) | ORAL | Status: DC | PRN
  Administered 2020-03-11 – 2020-03-14 (×2): 650 mg via ORAL
  Filled 2020-03-11: qty 2

## 2020-03-11 MED ORDER — ALBUTEROL SULFATE (2.5 MG/3ML) 0.083% IN NEBU
2.5000 mg | INHALATION_SOLUTION | RESPIRATORY_TRACT | Status: DC | PRN
  Administered 2020-03-11: 2.5 mg via RESPIRATORY_TRACT
  Filled 2020-03-11: qty 3

## 2020-03-11 MED ORDER — SODIUM CHLORIDE 0.9 % IV SOLN
2.0000 g | Freq: Once | INTRAVENOUS | Status: AC
  Administered 2020-03-11: 2 g via INTRAVENOUS
  Filled 2020-03-11: qty 2

## 2020-03-11 MED ORDER — PIPERACILLIN-TAZOBACTAM 3.375 G IVPB
3.3750 g | Freq: Three times a day (TID) | INTRAVENOUS | Status: DC
  Administered 2020-03-11 – 2020-03-14 (×10): 3.375 g via INTRAVENOUS
  Filled 2020-03-11 (×11): qty 50

## 2020-03-11 MED ORDER — ORAL CARE MOUTH RINSE
15.0000 mL | Freq: Two times a day (BID) | OROMUCOSAL | Status: DC
  Administered 2020-03-12 – 2020-03-14 (×6): 15 mL via OROMUCOSAL

## 2020-03-11 MED ORDER — INSULIN ASPART 100 UNIT/ML ~~LOC~~ SOLN
0.0000 [IU] | Freq: Three times a day (TID) | SUBCUTANEOUS | Status: DC
  Administered 2020-03-11: 2 [IU] via SUBCUTANEOUS
  Administered 2020-03-12: 3 [IU] via SUBCUTANEOUS
  Administered 2020-03-12 – 2020-03-13 (×3): 2 [IU] via SUBCUTANEOUS
  Administered 2020-03-13: 1 [IU] via SUBCUTANEOUS
  Administered 2020-03-13: 2 [IU] via SUBCUTANEOUS
  Administered 2020-03-14: 5 [IU] via SUBCUTANEOUS
  Administered 2020-03-14: 3 [IU] via SUBCUTANEOUS
  Administered 2020-03-14: 1 [IU] via SUBCUTANEOUS
  Filled 2020-03-11 (×10): qty 1

## 2020-03-11 MED ORDER — NOREPINEPHRINE 4 MG/250ML-% IV SOLN
INTRAVENOUS | Status: AC
  Administered 2020-03-11: 2 ug/min via INTRAVENOUS
  Filled 2020-03-11: qty 250

## 2020-03-11 MED ORDER — SODIUM CHLORIDE 0.9 % IV SOLN
250.0000 mL | INTRAVENOUS | Status: DC
  Administered 2020-03-11: 250 mL via INTRAVENOUS

## 2020-03-11 MED ORDER — PHENYLEPHRINE HCL-NACL 10-0.9 MG/250ML-% IV SOLN
25.0000 ug/min | INTRAVENOUS | Status: DC
  Administered 2020-03-11: 50 ug/min via INTRAVENOUS
  Filled 2020-03-11 (×2): qty 250

## 2020-03-11 MED ORDER — ACETAMINOPHEN 650 MG RE SUPP: 650.0000 mg | Freq: Four times a day (QID) | RECTAL | Status: DC | PRN

## 2020-03-11 MED ORDER — SODIUM CHLORIDE 0.9 % IV SOLN: 2.0000 g | INTRAVENOUS | Status: DC

## 2020-03-11 MED ORDER — FAMOTIDINE 20 MG PO TABS: 20.0000 mg | ORAL_TABLET | Freq: Two times a day (BID) | ORAL | Status: DC

## 2020-03-11 MED ORDER — CHLORHEXIDINE GLUCONATE CLOTH 2 % EX PADS
6.0000 | MEDICATED_PAD | Freq: Every day | CUTANEOUS | Status: DC
  Administered 2020-03-11 – 2020-03-14 (×4): 6 via TOPICAL

## 2020-03-11 MED ORDER — IPRATROPIUM-ALBUTEROL 0.5-2.5 (3) MG/3ML IN SOLN
3.0000 mL | RESPIRATORY_TRACT | Status: DC
  Administered 2020-03-11 – 2020-03-13 (×8): 3 mL via RESPIRATORY_TRACT
  Filled 2020-03-11 (×8): qty 3

## 2020-03-11 MED ORDER — CHLORHEXIDINE GLUCONATE 0.12 % MT SOLN
15.0000 mL | Freq: Two times a day (BID) | OROMUCOSAL | Status: DC
  Administered 2020-03-11 – 2020-03-14 (×7): 15 mL via OROMUCOSAL
  Filled 2020-03-11 (×7): qty 15

## 2020-03-11 MED ORDER — ATORVASTATIN CALCIUM 20 MG PO TABS
40.0000 mg | ORAL_TABLET | ORAL | Status: DC
  Administered 2020-03-12 – 2020-03-14 (×3): 40 mg via ORAL
  Filled 2020-03-11 (×3): qty 2

## 2020-03-11 MED ORDER — SODIUM CHLORIDE 0.9 % IV BOLUS (SEPSIS)
500.0000 mL | Freq: Once | INTRAVENOUS | Status: AC
  Administered 2020-03-11: 500 mL via INTRAVENOUS

## 2020-03-11 MED ORDER — SODIUM CHLORIDE 0.9 % IV SOLN
3.0000 g | Freq: Once | INTRAVENOUS | Status: DC
  Administered 2020-03-11: 3 g via INTRAVENOUS
  Filled 2020-03-11: qty 8

## 2020-03-11 MED ORDER — LINEZOLID 600 MG/300ML IV SOLN
600.0000 mg | Freq: Two times a day (BID) | INTRAVENOUS | Status: DC
  Administered 2020-03-11: 600 mg via INTRAVENOUS
  Filled 2020-03-11 (×4): qty 300

## 2020-03-11 MED ORDER — SODIUM CHLORIDE 0.9 % IV BOLUS (SEPSIS)
1000.0000 mL | Freq: Once | INTRAVENOUS | Status: AC
  Administered 2020-03-11: 1000 mL via INTRAVENOUS

## 2020-03-11 MED ORDER — SODIUM CHLORIDE 0.9 % IV BOLUS (SEPSIS): 1000.0000 mL | Freq: Once | INTRAVENOUS | Status: DC

## 2020-03-11 MED ORDER — DM-GUAIFENESIN ER 30-600 MG PO TB12: 1.0000 | ORAL_TABLET | Freq: Two times a day (BID) | ORAL | Status: DC | PRN

## 2020-03-11 MED ORDER — PANTOPRAZOLE SODIUM 40 MG PO TBEC: 40.0000 mg | DELAYED_RELEASE_TABLET | Freq: Every day | ORAL | Status: DC | PRN

## 2020-03-11 MED ORDER — FAMOTIDINE 20 MG PO TABS
20.0000 mg | ORAL_TABLET | Freq: Every day | ORAL | Status: DC
  Administered 2020-03-12 – 2020-03-14 (×3): 20 mg via ORAL
  Filled 2020-03-11 (×3): qty 1

## 2020-03-11 MED ORDER — VANCOMYCIN HCL IN DEXTROSE 1-5 GM/200ML-% IV SOLN: 1000.0000 mg | Freq: Once | INTRAVENOUS | Status: DC

## 2020-03-11 MED ORDER — NOREPINEPHRINE 4 MG/250ML-% IV SOLN: 2.0000 ug/min | INTRAVENOUS | Status: DC

## 2020-03-11 MED ORDER — VANCOMYCIN HCL 1500 MG/300ML IV SOLN
1500.0000 mg | Freq: Once | INTRAVENOUS | Status: DC
  Filled 2020-03-11: qty 300

## 2020-03-11 MED ORDER — ALLOPURINOL 100 MG PO TABS
300.0000 mg | ORAL_TABLET | Freq: Every day | ORAL | Status: DC
  Administered 2020-03-12 – 2020-03-14 (×3): 300 mg via ORAL
  Filled 2020-03-11 (×3): qty 3

## 2020-03-11 MED ORDER — ONDANSETRON HCL 4 MG/2ML IJ SOLN: 4.0000 mg | Freq: Three times a day (TID) | INTRAMUSCULAR | Status: DC | PRN

## 2020-03-11 MED ORDER — INSULIN ASPART 100 UNIT/ML ~~LOC~~ SOLN
0.0000 [IU] | Freq: Every day | SUBCUTANEOUS | Status: DC
  Administered 2020-03-14: 2 [IU] via SUBCUTANEOUS
  Filled 2020-03-11: qty 1

## 2020-03-11 MED ORDER — SODIUM CHLORIDE 0.9 % IV SOLN: INTRAVENOUS | Status: DC | PRN

## 2020-03-11 MED ORDER — ASPIRIN EC 325 MG PO TBEC
325.0000 mg | DELAYED_RELEASE_TABLET | Freq: Every day | ORAL | Status: DC
  Administered 2020-03-13 – 2020-03-14 (×2): 325 mg via ORAL
  Filled 2020-03-11 (×2): qty 1

## 2020-03-11 MED ORDER — NOREPINEPHRINE 16 MG/250ML-% IV SOLN
0.0000 ug/min | INTRAVENOUS | Status: DC
  Administered 2020-03-11: 10 ug/min via INTRAVENOUS
  Filled 2020-03-11: qty 250

## 2020-03-11 MED ORDER — NOREPINEPHRINE 4 MG/250ML-% IV SOLN: 0.0000 ug/min | INTRAVENOUS | Status: DC

## 2020-03-11 MED ORDER — VASOPRESSIN 20 UNITS/100 ML INFUSION FOR SHOCK
0.0000 [IU]/min | INTRAVENOUS | Status: DC
  Filled 2020-03-11: qty 100

## 2020-03-11 NOTE — ED Triage Notes (Signed)
Pt c/o waking with SOB and chills this morning, pt is tachypneic on arrival

## 2020-03-11 NOTE — ED Provider Notes (Signed)
[facility] Emergency Department Provider Note   ____________________________________________   Event Date/Time   First MD Initiated Contact with Patient 03/11/20 1213     (approximate)  I have reviewed the triage vital signs and the nursing notes.   HISTORY  Chief Complaint Shortness of Breath    HPI Thomas Morton is a 87 y.o. male with a history of atrial fibrillation, CHF, type 2 diabetes, emphysema, and laryngeal cancer for which she is currently undergoing radiation who presents via EMS for shortness of breath and chills upon awakening this morning.  Patient is tachypneic on arrival with 2 L nasal cannula in place.  Patient denies any exacerbating or relieving factors for this pain.  Patient states that symptoms have been stable since onset.  Patient currently denies any vision changes, tinnitus, difficulty speaking, facial droop, sore throat, chest pain, abdominal pain, nausea/vomiting/diarrhea, dysuria, or weakness/numbness/paresthesias in any extremity         Past Medical History:  Diagnosis Date   Afib ([place])    Aortic atherosclerosis ([facility])    Arthritis    B12 deficiency    Bilateral carotid artery stenosis    Cancer [facility])    prostate 2001   Cardiac defibrillator in situ    CHF (congestive heart failure) (H[facility])    Chronic gouty arthritis    Diabetes mellitus without complication (H[facility])    Diabetic sensorimotor neuropathy (H[facility])    Emphysema lung (H[facility])    GERD (gastroesophageal reflux disease)    History of cardioembolic cerebrovascular accident (CVA)    History of DVT (deep vein thrombosis)    History of kidney stones    H/O   History of prostatectomy    Hyperlipidemia    LBBB (left bundle branch block)    NICM (nonischemic cardiomyopathy) ([place])     Patient Active Problem List   Diagnosis Date Noted   Aspiration pneumonia ([place]) 03/11/2020   HCAP (healthcare-associated pneumonia) 03/11/2020   Severe sepsis  ([place]) 03/11/2020   Acute renal failure superimposed on stage 3a chronic kidney disease ([place]) 03/11/2020   GERD (gastroesophageal reflux disease)    Chronic gouty arthritis    History of cardioembolic cerebrovascular accident (CVA)    Hyperlipidemia    Type II diabetes mellitus with renal manifestations ([facility])    Atrial fibrillation, chronic (HCC)    Pancytopenia (HCC)    Elevated troponin    Larynx cancer ([place])    Acute respiratory failure with hypoxia ([place]) 12/22/2017   Dizziness and giddiness 10/15/2015   CVA (cerebral vascular accident) ([place]) 10/14/2015   Numbness 10/14/2015   Chronic renal insufficiency 10/14/2015   Essential hypertension 10/14/2015    Past Surgical History:  Procedure Laterality Date   CARDIAC DEFIBRILLATOR PLACEMENT     DIRECT LARYNGOSCOPY N/A 01/31/2020   Procedure: MICRO DIRECT LARYNGOSCOPYWITH BIOPSY;  Surgeon: Clyde Canterbury, MD;  Location: [facility] ORS;  Service: ENT;  Laterality: N/A;   eyelid surgery     KNEE ARTHROSCOPY Bilateral    PROSTATECTOMY      Prior to Admission medications   Medication Sig Start Date End Date Taking? Authorizing Provider  allopurinol (ZYLOPRIM) 300 MG tablet Take 300 mg by mouth at bedtime. 02/13/15  Yes [provider]  allopurinol (ZYLOPRIM) 300 MG tablet Take by mouth. 01/26/20  Yes [provider]  aspirin EC 325 MG EC tablet Take 1 tablet (325 mg total) by mouth daily. 10/16/15  Yes Theodoro Grist, MD  atorvastatin (LIPITOR) 40 MG tablet Take 1 tablet (40 mg  total) by mouth daily at 6 PM. Patient taking differently: Take 40 mg by mouth every morning. 10/15/15  Yes Theodoro Grist, MD  carvedilol (COREG) 3.125 MG tablet Take 3.125 mg by mouth 2 (two) times daily. 02/13/15  Yes [provider]  cyanocobalamin (,VITAMIN B-12,) 1000 MCG/ML injection Inject into the muscle. 01/26/20  Yes [provider]  famotidine (PEPCID) 20 MG tablet Take 20 mg by mouth 2 (two) times daily.  01/26/20  Yes [provider]  furosemide (LASIX) 20 MG tablet Take 20 mg by mouth daily. 01/26/20  Yes [provider]  glipiZIDE (GLUCOTROL) 5 MG tablet Take 5 mg by mouth daily. 01/26/20  Yes [provider]  indapamide (LOZOL) 1.25 MG tablet Take 1.25 mg by mouth every morning. 12/24/14  Yes [provider]  pantoprazole (PROTONIX) 40 MG tablet Take 1 tablet by mouth daily as needed (heartburn). 02/13/15  Yes [provider]  betamethasone dipropionate (DIPROLENE) 0.05 % cream Apply 1 application topically 2 (two) times daily as needed (irritation). Patient not taking: Reported on 03/11/2020 02/08/14   [provider]  budesonide (PULMICORT) 0.5 MG/2ML nebulizer solution Take 2 mLs (0.5 mg total) by nebulization 2 (two) times daily. Patient not taking: No sig reported 12/24/17   Epifanio Lesches, MD  glimepiride (AMARYL) 1 MG tablet Take 1 tablet (1 mg total) by mouth daily with breakfast. Patient taking differently: Take 1 mg by mouth in the morning and at bedtime. 12/24/17 12/24/18  Epifanio Lesches, MD  HYDROcodone-acetaminophen (NORCO/VICODIN) 5-325 MG tablet Take 1-2 tablets by mouth every 6 (six) hours as needed for moderate pain. Patient not taking: No sig reported 01/31/20   Clyde Canterbury, MD  ipratropium-albuterol (DUONEB) 0.5-2.5 (3) MG/3ML SOLN Take 3 mLs by nebulization every 6 (six) hours. Patient not taking: No sig reported 12/24/17   Epifanio Lesches, MD  triamcinolone (KENALOG) 0.025 % cream Apply 1 application topically daily as needed (irritation). Patient not taking: Reported on 03/11/2020 11/24/13   [provider]  vitamin B-12 (CYANOCOBALAMIN) 1000 MCG tablet Take 1,000 mcg by mouth daily. Patient not taking: Reported on 03/11/2020    [provider]    Allergies Sulfa antibiotics  Family History  Problem Relation Age of Onset   Throat cancer Mother    Diabetes Father     Social  History Social History   Tobacco Use   Smoking status: Former Smoker    Packs/day: 1.00    Years: 30.00    Pack years: 30.00    Types: Cigarettes    Quit date: 01/25/1968    Years since quitting: 52.1   Smokeless tobacco: Never Used  Vaping Use   Vaping Use: Never used  Substance Use Topics   Alcohol use: Yes    Comment: WINE EVERYDAY   Drug use: Never    Review of Systems Constitutional: Endorses fever/chills Eyes: No visual changes. ENT: No sore throat. Cardiovascular: Denies chest pain. Respiratory: Endorses shortness of breath. Gastrointestinal: No abdominal pain.  No nausea, no vomiting.  No diarrhea. Genitourinary: Negative for dysuria. Musculoskeletal: Negative for acute arthralgias Skin: Negative for rash. Neurological: Negative for headaches, weakness/numbness/paresthesias in any extremity Psychiatric: Negative for suicidal ideation/homicidal ideation   ____________________________________________   PHYSICAL EXAM:  VITAL SIGNS: ED Triage Vitals  Enc Vitals Group     BP 03/11/20 0949 (!) 93/50     Pulse Rate 03/11/20 0949 (!) 130     Resp 03/11/20 0949 (!) 28     Temp 03/11/20 0949 99 F (  37.2 C)     Temp Source 03/11/20 0949 Oral     SpO2 03/11/20 0949 94 %     Weight 03/11/20 0951 179 lb (81.2 kg)     Height 03/11/20 0951 5\' 10"  (1.778 m)     Head Circumference --      Peak Flow --      Pain Score 03/11/20 0951 0     Pain Loc --      Pain Edu? --      Excl. in [place]? --    Constitutional: Alert and oriented. Well appearing and in no acute distress. Eyes: Conjunctivae are normal. PERRL. Head: Atraumatic. Nose: No congestion/rhinnorhea. Mouth/Throat: Mucous membranes are moist. Neck: No stridor Cardiovascular: Grossly normal heart sounds.  Good peripheral circulation. Respiratory: Tachypnea.  Rhonchi over right lung fields Gastrointestinal: Soft and nontender. No distention. Musculoskeletal: No obvious deformities Neurologic:  Normal speech  and language. No gross focal neurologic deficits are appreciated. Skin:  Skin is warm and dry. No rash noted. Psychiatric: Mood and affect are normal. Speech and behavior are normal.  ____________________________________________   LABS (all labs ordered are listed, but only abnormal results are displayed)  Labs Reviewed  COMPREHENSIVE METABOLIC PANEL - Abnormal; Notable for the following components:      Result Value   Glucose, Bld 263 (*)    BUN 28 (*)    Creatinine, Ser 2.03 (*)    Calcium 8.7 (*)    Albumin 3.4 (*)    GFR, Estimated 31 (*)    All other components within normal limits  BRAIN NATRIURETIC PEPTIDE - Abnormal; Notable for the following components:   B Natriuretic Peptide 119.2 (*)    All other components within normal limits  CBC WITH DIFFERENTIAL/PLATELET - Abnormal; Notable for the following components:   WBC 1.1 (*)    RBC 2.35 (*)    Hemoglobin 9.0 (*)    HCT 26.7 (*)    MCV 113.6 (*)    MCH 38.3 (*)    Platelets 75 (*)    nRBC 1.9 (*)    Neutro Abs 0.3 (*)    Lymphs Abs 0.4 (*)    All other components within normal limits  LACTIC ACID, PLASMA - Abnormal; Notable for the following components:   Lactic Acid, Venous 3.3 (*)    All other components within normal limits  TROPONIN I (HIGH SENSITIVITY) - Abnormal; Notable for the following components:   Troponin I (High Sensitivity) 42 (*)    All other components within normal limits  RESP PANEL BY RT-PCR (FLU A&B, COVID) ARPGX2  CULTURE, BLOOD (ROUTINE X 2)  CULTURE, BLOOD (ROUTINE X 2)  MRSA PCR SCREENING  PATHOLOGIST SMEAR REVIEW  PROCALCITONIN  LACTIC ACID, PLASMA  LACTIC ACID, PLASMA   ____________________________________________  EKG  ED ECG REPORT I, Naaman Plummer, the attending physician, personally viewed and interpreted this ECG.  Date: 03/11/2020 EKG Time: 0939 Rate: 127 Rhythm: Tachycardic sinus rhythm QRS Axis: normal Intervals: normal ST/T Wave abnormalities: normal Narrative  Interpretation: no evidence of acute ischemia  ____________________________________________  RADIOLOGY  ED MD interpretation: Single view portable x-ray shows interstitial and patchy airspace opacities in the right upper lobe suggesting pneumonia  Official radiology report(s): DG Chest Port 1 View  Result Date: 03/11/2020 CLINICAL DATA:  Shortness of breath and chills. EXAM: PORTABLE CHEST 1 VIEW COMPARISON:  10/14/2015 FINDINGS: 1014 hours. Interstitial and patchy airspace disease noted right lung apex. Interstitial markings are diffusely coarsened with chronic features. Interstitial opacity at  the left base is stable, likely scarring. Cardiopericardial silhouette is at upper limits of normal for size. Permanent pacer/AICD again noted. Telemetry leads overlie the chest. IMPRESSION: Interstitial and patchy airspace disease in the right upper lobe suggests pneumonia. Electronically Signed   By: Misty Stanley M.D.   On: 03/11/2020 10:29    ____________________________________________   PROCEDURES  Procedure(s) performed (including Critical Care):  .Critical Care Performed by: Naaman Plummer, MD Authorized by: Naaman Plummer, MD   Critical care provider statement:    Critical care time (minutes):  35   Critical care time was exclusive of:  Separately billable procedures and treating other patients   Critical care was necessary to treat or prevent imminent or life-threatening deterioration of the following conditions:  Sepsis and respiratory failure   Critical care was time spent personally by me on the following activities:  Discussions with consultants, evaluation of patient's response to treatment, examination of patient, ordering and performing treatments and interventions, ordering and review of laboratory studies, ordering and review of radiographic studies, pulse oximetry, re-evaluation of patient's condition, obtaining history from patient or surrogate and review of old charts   I  assumed direction of critical care for this patient from another provider in my specialty: yes     Care discussed with: admitting provider   .1-3 Lead EKG Interpretation Performed by: Naaman Plummer, MD Authorized by: Naaman Plummer, MD     Interpretation: normal     ECG rate:  97   ECG rate assessment: normal     Rhythm: sinus rhythm     Ectopy: none     Conduction: normal       ____________________________________________   INITIAL IMPRESSION / ASSESSMENT AND PLAN / ED COURSE  As part of my medical decision making, I reviewed the following data within the [facility] notes reviewed and incorporated, Labs reviewed, EKG interpreted, Old chart reviewed, Radiograph reviewed and Notes from prior ED visits reviewed and incorporated        Presents with shortness of breath, cough, and malaise concerning for pneumonia.  DDx: PE, COPD exacerbation, Pneumothorax, TB, Atypical ACS, Esophageal Rupture, Toxic Exposure, Foreign Body Airway Obstruction.  Workup: CXR CBC, CMP, lactate, troponin  Given History, Exam, and Workup presentation most consistent with pneumonia.  Findings: Right upper lobe pneumonia  Tx: Cefepime, Vanco, Unasyn  1322 Reassessment: As patient is continuing to require supplemental oxygenation for acute hypoxic respiratory failure, patient will require admission to the internal medicine service for further evaluation and management  Disposition: Admit      ____________________________________________   FINAL CLINICAL IMPRESSION(S) / ED DIAGNOSES  Final diagnoses:  Shortness of breath  HCAP (healthcare-associated pneumonia)  Sepsis with acute renal failure without septic shock, due to unspecified organism, unspecified acute renal failure type ([place])  AKI (acute kidney injury) ([place])  Acute respiratory failure with hypoxia [facility])     ED Discharge Orders    None       Note:  This document was prepared using Dragon voice  recognition software and may include unintentional dictation errors.   Naaman Plummer, MD 03/11/20 1325

## 2020-03-11 NOTE — Progress Notes (Signed)
eLink Physician-Brief Progress Note Patient Name: Thomas Morton DOB: 1927/02/09 MRN: [id number]   Date of Service  03/11/2020  HPI/Events of Note  49 M history of laryngeal cancer on radiation therapy, HFrEF s/p AICD, hypertension , dyslipidemia, DM presented with shortness of breath and chills. He was noted to be hypotensive and is now on norepinephrine and vasopressin after fluid boluses given.  CXR with RLL infiltrate  eICU Interventions  Started in linezolid and cefepime continued on piperacillin-tazobactam with aspiration suspected.     Intervention Category Major Interventions: Respiratory failure - evaluation and management;Shock - evaluation and management;Acute renal failure - evaluation and management;Infection - evaluation and management Evaluation Type: New Patient Evaluation  Judd Lien 03/11/2020, 11:34 PM

## 2020-03-11 NOTE — ED Notes (Signed)
Patient is resting comfortably. 

## 2020-03-11 NOTE — Consult Note (Addendum)
Pharmacy Antibiotic Note  Thomas Morton is a 87 y.o. male admitted on 03/11/2020 with pneumonia.  Pharmacy has been consulted for Vancomycin, Cefepime, & Zosyn dosing.  Plan: Cefepime 2g x1 received. **Unasyn 3g ordered -->zosyn 3.375g q8H  ** Added w/ c/f aspiration, but changed zosyn prior to any unasyn dosing.  Vancomycin 1.5g x1 --> *Linezolid IV 600mg  q12h * ISO AKI and c/f renal toxicity with concomitant Zosyn will transition to linezolid.  Cefepime 2g q24h discontinued.  Height: 5\' 10"  (177.8 cm) Weight: 81.2 kg (179 lb) IBW/kg (Calculated) : 73kg  Temp (24hrs), Avg:99 F (37.2 C), Min:99 F (37.2 C), Max:99 F (37.2 C)  Recent Labs  Lab 03/06/20 1008 03/11/20 1025  WBC 1.2*  --   LATICACIDVEN  --  3.3*    CrCl cannot be calculated (Patient's most recent lab result is older than the maximum 21 days allowed.).    Allergies  Allergen Reactions  . Sulfa Antibiotics Rash    Per patient low doses do not cause issues, only higher doses    Antimicrobials this admission: Linezolid IV (2/28 >>  Zosyn (2/28 >>   Cefepime x1 (2/28)  Microbiology results: 2/28 BCx: sent/pending 2/28 MRSA PCR: active/ordered  Thank you for allowing pharmacy to be a part of this patient's care.  Shanon Brow Jame Morrell 03/11/2020 10:59 AM

## 2020-03-11 NOTE — ED Notes (Signed)
BP cuff switched to new arm to check for consistency. Pt was laying on the left side.

## 2020-03-11 NOTE — Consult Note (Signed)
NAME:  Thomas Morton, MRN:  [id number], DOB:  06/17/1927, LOS: 0 ADMISSION DATE:  03/11/2020, CONSULTATION DATE:  03/11/2020 REFERRING MD:  Dr. Blaine Hamper, CHIEF COMPLAINT: Shortness of breath & chills  Brief History:  87 year old male admitted with acute hypoxic respiratory failure due to suspected aspiration pneumonia versus HCAP and severe sepsis with septic shock requiring vasopressors admitted to ICU with PCCM consulted.  History of Present Illness:  87 year old male who is currently being undergoing radiation for laryngeal cancer, presents to the ED via EMS due to shortness of breath and chills this morning (03/11/2020).  Patient reported upon arrival to the ED that his symptoms have been stable since onset and denied any vision changes, tinnitus, difficulty speaking, facial droop, sore throat, chest pain, abdominal pain, nausea/vomiting/diarrhea, dysuria or weakness/numbness/paresthesias in any extremity. Initial vitals: T 99, tachypneic with RR-28, tachycardic at 130, BP marginal at 93/50, follow-up 107/44 (63), SpO2 94% on room air. ED course: Chest x-ray revealed findings suggestive of right upper lobe pneumonia.  Labs significant for BUN 28, Cr 2.03 (elevated from 12/2017: 25/1.47), BNP slightly elevated at 119.2, troponin: 42 > 125, lactic acidosis: 3.3 > 2.5 > 2.1, PCT suggestive of bacterial infection at 2.20, pancytopenia: with WBC 1.2 (redrawn for verification, stable at 1.1), hemoglobin 9 & platelets 65 (75 on recollect).  Patient was initially admitted to progressive care with 2 L of oxygen nasal cannula.  However at 1700 patient's blood pressure dropped to 88/37 (52) vasopressors were initiated by Dr. Blaine Hamper, the attending physician, PCCM consulted for vasopressor administration.  Past Medical History:  Nonischemic dilated cardiomyopathy -s/p ICD placement (DDD-rate limit 60) Left bundle branch block Hyperlipidemia CVA GERD Emphysema Type 2 diabetes mellitus Gouty arthritis HFrEF  -echo from 2017 shows LVEF at 40% with diffuse hypokinesis Prostate cancer status post prostatectomy Bilateral carotid artery stenosis Atrial fibrillation Aortic atherosclerosis History of kidney stones History of DVT -not on anticoagulation B12 deficiency  Significant Hospital Events:  03/11/2020-admit to stepdown with septic shock requiring vasopressor administration  Consults:  PCCM Oncology  Procedures:  03/11/2020 right femoral CVC 3L placed >>  Significant Diagnostic Tests:    Micro Data:  03/11/2020 blood cultures x 2 >> 03/11/2020 MRSA PCR >> 03/11/2020 COVID-19 >> negative 03/11/2020 influenza A/B >> negative 03/11/2020 Legionella Ur Ag >>  03/11/2020 strep pneumoniae Ur antigen >>  Antimicrobials:  03/11/2020 cefepime x1 03/11/2020 Linezolid >> 03/11/2020 Zosyn >>  Interim History / Subjective:  Patient alert and responsive, frail appearing, sitting up in bed. He confirms that starting today, 03/11/2020, he felt febrile with chills and mild dyspnea. No other complaints. His son had come to pick him up for radiation and instead they decided to come the ED for evaluation. Due to ongoing hypotension with SBP in the 60s to 70s and MAP in the 50s on Levophed drip at max dosing with additional fluid bolus running, the patient was advised about the need for temporary central line placement.  Mr. Mestas understood the importance of central line placement for medication administration and vasopressor support and consented requesting his son be updated.  I called Rudolpho Claxton the patient's son and updated him on the decision to place a central line temporarily.  Caylor Tallarico was in agreement for central line placement.  Labs/ Imaging personally reviewed EKG: Rate 127, sinus tachycardia, normal axis, non specific T-wave inversions in III, no evidence of acute ischemia.  Na+/ K+: 136/4.5 BUN/Cr.:  28/2.03 Serum CO2/ AG: 23/12  Hgb: 9 Troponin: 42 > 125 BNP:  119.2  WBC/ TMAX: 1.2,  1.1 on redraw /37.2 Lactic/ PCT: 3.3 > 2.5 > 2.1/2.20  CXR 03/11/2020: Interstitial and patchy airspace disease suggestive of right upper lobe pneumonia.  Permanent pacer/AICD noted. Objective   Blood pressure (!) 88/39, pulse 94, temperature 98.6 F (37 C), temperature source Oral, resp. rate (!) 29, height 5\' 10"  (1.778 m), weight 81.2 kg, SpO2 100 %.       No intake or output data in the 24 hours ending 03/11/20 1920 Filed Weights   03/11/20 0951  Weight: 81.2 kg    Examination: General: Adult male, critically ill, lying in bed intubated, NAD HEENT: MM red/moist, anicteric, atraumatic, neck supple Neuro: A&O x 4, able to follow commands, PERRL +3, MAE CV: s1s2 RRR, ST on monitor, no r/m/g- AICD/PPM present Pulm: Regular, non labored on room air, breath sounds clear/diminished-BUL & diminished-BLL GI: soft, rounded, non tender, bs x 4 GU: foley in place with clear yellow urine Skin: scattered abrasions on BLE- scattered ecchymosis Extremities: warm/dry, pulses + 2 R/P, no edema noted  Resolved Hospital Problem list     Assessment & Plan:  Sepsis with septic shock due to suspected aspiration pneumonia vs HCAP Lactic: 3.3 > 2.5 > 2.1, Baseline PCT: 2.20, UA: pending, CXR: Interstitial & airspace opacity in RUL  Initial interventions/workup included: 2.5 L of NS & Cefepime - Supplemental oxygen as needed, to maintain SpO2 > 90% - f/u cultures, trend lactic/ PCT - Daily CBC - monitor WBC/ fever curve - IV antibiotics: zosyn & linezolid - IVF hydration as needed: additional NS bolus ordered over 4 hours - Continue peripheral vasopressors to maintain MAP< 65, norepinephrine first line - Strict I/O's: alert provider if UOP < 0.5 mL/kg/hr  Acute Hypoxic Respiratory Failure secondary to suspected Aspiration Pnuemonia vs HCAP - Supplemental O2 to maintain SpO2 > 90% - Intermittent chest x-ray & ABG PRN - Daily WUA with SBT as tolerated  - Ensure adequate pulmonary hygiene  - F/u  cultures, trend PCT - Continue CAP/Aspiration Pna coverage: Linezolid & Zosyn - bronchodilators PRN  Acute Renal failure on CKD Stage 3a in the setting of septic shock Baseline Cr: 1.1-1.5 , Cr on admission: 2.03 - Strict I/O's: alert provider if UOP < 0.5 mL/kg/hr - gentle IVF hydration  - Daily BMP, replace electrolytes PRN - Avoid nephrotoxic agents as able, ensure adequate renal perfusion - Consult nephrology if iHD or CRRT indicated  - consider renal US  Pancytopenia in the setting of 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 Per oncologist Dr. Kem Parkinson note the patient's progressive pancytopenia and associated macrocytic RBC indices is suggestive of myelodysplastic syndrome or possible leukemia. - Monitor for s/s of bleeding - Daily CBC - Transfuse for Hgb <7, consider platelet transfusion for platelet < 10  - neutropenic precautions - Oncology consulted, appreciate input  Best practice (evaluated daily)  Diet: Dysphagia 2 diet Pain/Anxiety/Delirium protocol (if indicated): Per primary VAP protocol (if indicated): N/A DVT prophylaxis: SCD's- per primary, will consider adding if hemoglobin remains stable GI prophylaxis: Protonix Glucose control: Monitor AC at bedtime with SSI Mobility: Bedrest while on pressors, mobilize as tolerated Disposition: ICU  Goals of Care:  Last date of multidisciplinary goals of care discussion: 03/11/2020 Family and staff present: APP, patient and care RN Summary of discussion: Updated plan of care including vasopressor administration and central line placement, all questions and concerns answered Follow up goals of care discussion due: 03/12/2020  Code Status: DNR/DNI with vasopressor administration only  Labs   CBC: Recent Labs  Lab 03/06/20 1008 03/11/20 1024  WBC 1.2* 1.1*  NEUTROABS  --  0.3*  HGB 9.4* 9.0*  HCT 27.5* 26.7*  MCV 114.6* 113.6*  PLT 65*  75*    Basic Metabolic Panel: Recent Labs  Lab 03/11/20 1024  NA 136  K 4.5  CL 101  CO2 23  GLUCOSE 263*  BUN 28*  CREATININE 2.03*  CALCIUM 8.7*   GFR: Estimated Creatinine Clearance: 25 mL/min (A) (by C-G formula based on SCr of 2.03 mg/dL (H)). Recent Labs  Lab 03/06/20 1008 03/11/20 1024 03/11/20 1025 03/11/20 1304 03/11/20 1725  PROCALCITON  --  2.20  --   --   --   WBC 1.2* 1.1*  --   --   --   LATICACIDVEN  --   --  3.3* 2.5* 2.1*    Liver Function Tests: Recent Labs  Lab 03/11/20 1024  AST 22  ALT 10  ALKPHOS 62  BILITOT 1.1  PROT 6.7  ALBUMIN 3.4*   No results for input(s): LIPASE, AMYLASE in the last 168 hours. No results for input(s): AMMONIA in the last 168 hours.  ABG No results found for: PHART, PCO2ART, PO2ART, HCO3, TCO2, ACIDBASEDEF, O2SAT   Coagulation Profile: No results for input(s): INR, PROTIME in the last 168 hours.  Cardiac Enzymes: No results for input(s): CKTOTAL, CKMB, CKMBINDEX, TROPONINI in the last 168 hours.  HbA1C: Hgb A1c MFr Bld  Date/Time Value Ref Range Status  12/22/2017 12:14 PM 5.8 (H) 4.8 - 5.6 % Final    Comment:    (NOTE) Pre diabetes:          5.7%-6.4% Diabetes:              >6.4% Glycemic control for   <7.0% adults with diabetes   10/14/2015 07:24 AM 6.1 (H) 4.8 - 5.6 % Final    Comment:    (NOTE)         Pre-diabetes: 5.7 - 6.4         Diabetes: >6.4         Glycemic control for adults with diabetes: <7.0     CBG: Recent Labs  Lab 03/11/20 1723  GLUCAP 166*    Review of Systems: Positives in BOLD  Gen: Denies fever, chills, weight change, fatigue, night sweats HEENT: Denies blurred vision, double vision, hearing loss, tinnitus, sinus congestion, rhinorrhea, sore throat, neck stiffness, dysphagia PULM: Denies shortness of breath, cough, sputum production, hemoptysis, wheezing CV: Denies chest pain, edema, orthopnea, paroxysmal nocturnal dyspnea, palpitations GI: Denies abdominal pain,  nausea, vomiting, diarrhea, hematochezia, melena, constipation, change in bowel habits GU: Denies dysuria, hematuria, polyuria, oliguria, urethral discharge Endocrine: Denies hot or cold intolerance, polyuria, polyphagia or appetite change Derm: Denies rash, dry skin, scaling or peeling skin change Heme: Denies easy bruising, bleeding, bleeding gums Neuro: Denies headache, numbness, weakness, slurred speech, loss of memory or consciousness  Past Medical History:  He,  has a past medical history of Afib ([place]), Aortic atherosclerosis ([place]), Arthritis, B12 deficiency, Bilateral carotid artery stenosis, Cancer ([place]), Cardiac defibrillator in situ, CHF (congestive heart failure) ([place]), Chronic gouty arthritis, Diabetes mellitus without complication ([place]), Diabetic sensorimotor neuropathy ([place]), Emphysema lung ([place]), GERD (gastroesophageal reflux disease), History of cardioembolic cerebrovascular accident (CVA), History of DVT (deep vein thrombosis), History of kidney stones, History of prostatectomy, Hyperlipidemia, LBBB (left bundle branch block), and NICM (nonischemic cardiomyopathy) ([place]).   Surgical History:  Past Surgical History:  Procedure Laterality Date  . CARDIAC DEFIBRILLATOR PLACEMENT    . DIRECT LARYNGOSCOPY N/A 01/31/2020   Procedure: MICRO DIRECT LARYNGOSCOPYWITH BIOPSY;  Surgeon: Clyde Canterbury, MD;  Location: [facility] ORS;  Service: ENT;  Laterality: N/A;  . eyelid surgery    . KNEE ARTHROSCOPY Bilateral   . PROSTATECTOMY       Social History:   reports that he quit smoking about 52 years ago. His smoking use included cigarettes. He has a 30.00 pack-year smoking history. He has never used smokeless tobacco. He reports current alcohol use. He reports that he does not use drugs.   Family History:  His family history includes Diabetes in his father; Throat cancer in his mother.   Allergies Allergies  Allergen Reactions  . Sulfa Antibiotics Rash    Per patient low doses do not  cause issues, only higher doses     Home Medications  Prior to Admission medications   Medication Sig Start Date End Date Taking? Authorizing Provider  allopurinol (ZYLOPRIM) 300 MG tablet Take 300 mg by mouth at bedtime. 02/13/15  Yes [provider]  allopurinol (ZYLOPRIM) 300 MG tablet Take by mouth. 01/26/20  Yes [provider]  aspirin EC 325 MG EC tablet Take 1 tablet (325 mg total) by mouth daily. 10/16/15  Yes Theodoro Grist, MD  atorvastatin (LIPITOR) 40 MG tablet Take 1 tablet (40 mg total) by mouth daily at 6 PM. Patient taking differently: Take 40 mg by mouth every morning. 10/15/15  Yes Theodoro Grist, MD  carvedilol (COREG) 3.125 MG tablet Take 3.125 mg by mouth 2 (two) times daily. 02/13/15  Yes [provider]  cyanocobalamin (,VITAMIN B-12,) 1000 MCG/ML injection Inject into the muscle. 01/26/20  Yes [provider]  famotidine (PEPCID) 20 MG tablet Take 20 mg by mouth 2 (two) times daily. 01/26/20  Yes [provider]  furosemide (LASIX) 20 MG tablet Take 20 mg by mouth daily. 01/26/20  Yes [provider]  glipiZIDE (GLUCOTROL) 5 MG tablet Take 5 mg by mouth daily. 01/26/20  Yes [provider]  indapamide (LOZOL) 1.25 MG tablet Take 1.25 mg by mouth every morning. 12/24/14  Yes [provider]  pantoprazole (PROTONIX) 40 MG tablet Take 1 tablet by mouth daily as needed (heartburn). 02/13/15  Yes [provider]  betamethasone dipropionate (DIPROLENE) 0.05 % cream Apply 1 application topically 2 (two) times daily as needed (irritation). Patient not taking: Reported on 03/11/2020 02/08/14   [provider]  budesonide (PULMICORT) 0.5 MG/2ML nebulizer solution Take 2 mLs (0.5 mg total) by nebulization 2 (two) times daily. Patient not taking: No sig reported 12/24/17   Epifanio Lesches, MD  glimepiride (AMARYL) 1 MG tablet Take 1 tablet (1 mg total) by mouth daily with breakfast. Patient taking  differently: Take 1 mg by mouth in the morning and at bedtime. 12/24/17 12/24/18  Epifanio Lesches, MD  HYDROcodone-acetaminophen (NORCO/VICODIN) 5-325 MG tablet Take 1-2 tablets by mouth every 6 (six) hours as needed for moderate pain. Patient not taking: No sig reported 01/31/20   Clyde Canterbury, MD  ipratropium-albuterol (DUONEB) 0.5-2.5 (3) MG/3ML SOLN Take 3 mLs by nebulization every 6 (six) hours. Patient not taking: No sig reported 12/24/17   Epifanio Lesches, MD  triamcinolone (KENALOG) 0.025 % cream Apply 1 application topically daily as needed (irritation). Patient not taking: Reported on 03/11/2020 11/24/13   [provider]  vitamin B-12 (CYANOCOBALAMIN) 1000 MCG tablet Take 1,000 mcg by mouth daily. Patient not taking:  Reported on 03/11/2020    [provider]     Critical care time: 35 minutes       Venetia Night, AGACNP-BC Acute Care Nurse Practitioner [facility]   [phone] / [phone] Please see Amion for pager details.

## 2020-03-11 NOTE — Consult Note (Signed)
[facility]  Date of admission:  03/11/2020  Inpatient day:  03/11/2020  Consulting physician: Dr Noah Delaine   Reason for Consultation:  Pancytopenia  Chief Complaint: Thomas Morton is a 87 y.o. 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  HPI: The patient presented with a several month history of increasing hoarseness.  He was noted to have a friable lesion of the left true vocal cord with extension of 8 mm to the subglottic region.  Right true vocal cord was also dysplastic.  Biopsy was positive for squamous cell carcinoma.  Soft tissue neck CT in 02/16/2020 revealed no evidence of adenopathy.  He began radiation at [facility] beginning in 02/28/2020.  Patient was in his usual state of health until today.  He notes that his throat has been a little sore secondary to radiation.  He has been eating "not a lot" but not choking.  He presented this morning with fever and chills shortness of breath.  He denies any significant cough.  Chest x-ray revealed interstitial and patchy airspace disease in the right upper lobe suggestive of pneumonia.  Labs revealed a hematocrit of 26.7, hemoglobin 9.0, MCV 113.6, platelets 75,000, WBC 1100 with an ANC of 300.  Lactic acid was 3.3.  BNP was 119.2 and troponin 42. Creatinine was 2.06.  He was cultured and started on broad-spectrum antibiotics (cefepime then Zosyn).  CBC has been followed: 12/22/2017: Hematocrit 41.6, hemoglobin 13.6, MCV 106.4, platelets 171,000, WBC 11,300. 12/23/2017: Hematocrit 37.0, hemoglobin 12.0, MCV 107.2, platelets 149,000, WBC   9,000. 07/26/2019: Hematocrit 37.4, hemoglobin 12.9, MCV 107.5, platelets 153,000, WBC   3,900. 01/19/2020: Hematocrit 33.0, hemoglobin 11.2, MCV 112.2, platelets 138,000, WBC   1,600.  Uric acid 7.8.  B12 180. 03/06/2020: Hematocrit 27.5, hemoglobin   9.4, MCV 114.6, platelets   65,000, WBC    1,200.   He denies any new medications or herbal products except for B12 injections which began every other week x3 (last 03/08/2020).  Peripheral smear today revealed thrombocytopenia.  There was macrocytic anemia with circulating nucleated RBCs.  There is neutropenia and hypersegmented neutrophils.  There were atypical cells with varied appearances.  There were several blasts and other large cells having a lymphoid appearance.  Hematologic neoplasm was favored.   Past Medical History:  Diagnosis Date  . Afib ([place])   . Aortic atherosclerosis ([place])   . Arthritis   . B12 deficiency   . Bilateral carotid artery stenosis   . Cancer [facility])    prostate 2001  . Cardiac defibrillator in situ   . CHF (congestive heart failure) ([place])   . Chronic gouty arthritis   . Diabetes mellitus without complication ([place])   . Diabetic sensorimotor neuropathy ([place])   . Emphysema lung ([place])   . GERD (gastroesophageal reflux disease)   . History of cardioembolic cerebrovascular accident (CVA)   . History of DVT (deep vein thrombosis)   . History of kidney stones    H/O  . History of prostatectomy   . Hyperlipidemia   . LBBB (left bundle branch block)   . NICM (nonischemic cardiomyopathy) [facility])     Past Surgical History:  Procedure Laterality Date  . CARDIAC DEFIBRILLATOR PLACEMENT    . DIRECT LARYNGOSCOPY N/A 01/31/2020   Procedure: MICRO DIRECT LARYNGOSCOPYWITH BIOPSY;  Surgeon: Clyde Canterbury, MD;  Location: [facility] ORS;  Service: ENT;  Laterality: N/A;  . eyelid surgery    .  KNEE ARTHROSCOPY Bilateral   . PROSTATECTOMY      Family History  Problem Relation Age of Onset  . Throat cancer Mother   . Diabetes Father     Social History:  reports that he quit smoking about 52 years ago. His smoking use included cigarettes. He has a 30.00 pack-year smoking history. He has never used smokeless tobacco. He reports current alcohol use. He reports that he does not use drugs.  The patient denies any  exposure to radiation or toxins.  The patient lives in [place] independently.  Patient's two sons check on him regularly.  He is accompanied by his son Barbaraann Rondo who is medical power of attorney.  Allergies:  Allergies  Allergen Reactions  . Sulfa Antibiotics Rash    Per patient low doses do not cause issues, only higher doses    (Not in a hospital admission)  Review of Systems  Constitutional: Positive for chills, diaphoresis, fever and malaise/fatigue. Negative for weight loss.  HENT: Positive for sore throat (secondary to radiation). Negative for congestion, ear discharge, ear pain and nosebleeds.        Runny nose x 2 days.  Eyes: Negative for blurred vision, double vision and pain.  Respiratory: Positive for shortness of breath. Negative for cough, hemoptysis, sputum production and wheezing.   Cardiovascular: Negative.  Negative for chest pain, palpitations, orthopnea, claudication and leg swelling.  Gastrointestinal: Negative.  Negative for abdominal pain, blood in stool, constipation, diarrhea, melena, nausea and vomiting.  Genitourinary: Negative.  Negative for dysuria, frequency, hematuria and urgency.  Musculoskeletal: Positive for falls (x 2 in the past year). Negative for back pain, joint pain, myalgias and neck pain.  Skin: Negative.  Negative for rash.  Neurological: Positive for weakness (general). Negative for dizziness, sensory change, speech change, focal weakness and headaches.  Endo/Heme/Allergies: Negative for environmental allergies. Does not bruise/bleed easily.  Psychiatric/Behavioral: Negative for depression and memory loss. The patient is not nervous/anxious and does not have insomnia.     Vitals:  Blood pressure (!) 88/39, pulse 94, temperature 98.6 F (37 C), temperature source Oral, resp. rate (!) 29, height 5\' 10"  (1.778 m), weight 179 lb (81.2 kg), SpO2 100 %.   Physical Exam Vitals and nursing note reviewed.  Constitutional:      General: He is not in  acute distress.    Appearance: He is ill-appearing. He is not toxic-appearing or diaphoretic.     Interventions: He is not intubated. HENT:     Head: Atraumatic.     Mouth/Throat:     Mouth: Mucous membranes are moist.     Pharynx: Oropharynx is clear.  Eyes:     Extraocular Movements: Extraocular movements intact.     Pupils: Pupils are equal, round, and reactive to light.  Cardiovascular:     Rate and Rhythm: Regular rhythm.  Pulmonary:     Effort: Pulmonary effort is normal. No tachypnea or respiratory distress. He is not intubated.     Breath sounds: No stridor. No decreased breath sounds, wheezing, rhonchi or rales.  Chest:     Chest wall: No mass, deformity or edema.  Breasts:     Right: No axillary adenopathy or supraclavicular adenopathy.     Left: No axillary adenopathy or supraclavicular adenopathy.    Abdominal:     Palpations: Abdomen is soft. There is no hepatomegaly, splenomegaly or mass.     Tenderness: There is no abdominal tenderness.  Musculoskeletal:     Cervical back: Normal range of motion  and neck supple.     Right lower leg: No edema.     Left lower leg: No edema.  Lymphadenopathy:     Head:     Right side of head: No preauricular, posterior auricular or occipital adenopathy.     Left side of head: No preauricular, posterior auricular or occipital adenopathy.     Cervical: No cervical adenopathy.     Upper Body:     Right upper body: No supraclavicular or axillary adenopathy.     Left upper body: No supraclavicular or axillary adenopathy.     Lower Body: No right inguinal adenopathy.  Skin:    General: Skin is warm and dry.     Findings: No ecchymosis or erythema.  Neurological:     General: No focal deficit present.     Mental Status: He is alert and oriented to person, place, and time.  Psychiatric:        Mood and Affect: Mood normal.        Behavior: Behavior normal.     Results for orders placed or performed during the hospital encounter  of 03/11/20 (from the past 48 hour(s))  Comprehensive metabolic panel     Status: Abnormal   Collection Time: 03/11/20 10:24 AM  Result Value Ref Range   Sodium 136 135 - 145 mmol/L   Potassium 4.5 3.5 - 5.1 mmol/L   Chloride 101 98 - 111 mmol/L   CO2 23 22 - 32 mmol/L   Glucose, Bld 263 (H) 70 - 99 mg/dL    Comment: Glucose reference range applies only to samples taken after fasting for at least 8 hours.   BUN 28 (H) 8 - 23 mg/dL   Creatinine, Ser 2.03 (H) 0.61 - 1.24 mg/dL   Calcium 8.7 (L) 8.9 - 10.3 mg/dL   Total Protein 6.7 6.5 - 8.1 g/dL   Albumin 3.4 (L) 3.5 - 5.0 g/dL   AST 22 15 - 41 U/L   ALT 10 0 - 44 U/L   Alkaline Phosphatase 62 38 - 126 U/L   Total Bilirubin 1.1 0.3 - 1.2 mg/dL   GFR, Estimated 31 (L) >60 mL/min    Comment: (NOTE) Calculated using the CKD-EPI Creatinine Equation (2021)    Anion gap 12 5 - 15    Comment: Performed at [facility], [address] [place], [place] [address]  Brain natriuretic peptide     Status: Abnormal   Collection Time: 03/11/20 10:24 AM  Result Value Ref Range   B Natriuretic Peptide 119.2 (H) 0.0 - 100.0 pg/mL    Comment: Performed at [facility], [address] [place], [place] [address]  CBC with Differential     Status: Abnormal   Collection Time: 03/11/20 10:24 AM  Result Value Ref Range   WBC 1.1 (LL) 4.0 - 10.5 K/uL    Comment: REPEATED TO VERIFY WHITE COUNT CONFIRMED ON SMEAR THIS CRITICAL RESULT HAS VERIFIED AND BEEN CALLED TO S.ALLISON,RN BY GEOFFREY MCADOO ON 02 28 2022 AT 1131, AND HAS BEEN READ BACK.     RBC 2.35 (L) 4.22 - 5.81 MIL/uL   Hemoglobin 9.0 (L) 13.0 - 17.0 g/dL   HCT 26.7 (L) 39.0 - 52.0 %   MCV 113.6 (H) 80.0 - 100.0 fL   MCH 38.3 (H) 26.0 - 34.0 pg   MCHC 33.7 30.0 - 36.0 g/dL   RDW 15.2 11.5 - 15.5 %   Platelets 75 (L) 150 - 400 K/uL    Comment: Immature  Platelet Fraction may be clinically indicated, consider ordering this additional test [id number] REPEATED TO  VERIFY PLATELET COUNT CONFIRMED BY SMEAR    nRBC 1.9 (H) 0.0 - 0.2 %   Neutrophils Relative % 31 %   Neutro Abs 0.3 (LL) 1.7 - 7.7 K/uL    Comment: This critical result has verified and been called to [facility] by Vinie Sill on 02 28 2022 at 1145, and has been read back.    Lymphocytes Relative 37 %   Lymphs Abs 0.4 (L) 0.7 - 4.0 K/uL   Monocytes Relative 22 %   Monocytes Absolute 0.2 0.1 - 1.0 K/uL   Eosinophils Relative 4 %   Eosinophils Absolute 0.0 0.0 - 0.5 K/uL   Basophils Relative 0 %   Basophils Absolute 0.0 0.0 - 0.1 K/uL   Immature Granulocytes 6 %   Abs Immature Granulocytes 0.06 0.00 - 0.07 K/uL    Comment: Performed at [facility], [place], [place] [address]  Troponin I (High Sensitivity)     Status: Abnormal   Collection Time: 03/11/20 10:24 AM  Result Value Ref Range   Troponin I (High Sensitivity) 42 (H) <18 ng/L    Comment: (NOTE) Elevated high sensitivity troponin I (hsTnI) values and significant  changes across serial measurements may suggest ACS but many other  chronic and acute conditions are known to elevate hsTnI results.  Refer to the "Links" section for chest pain algorithms and additional  guidance. Performed at [facility], [address] [place],  [address]   Resp Panel by RT-PCR (Flu A&B, Covid) Nasopharyngeal Swab     Status: None   Collection Time: 03/11/20 10:24 AM   Specimen: Nasopharyngeal Swab; Nasopharyngeal(NP) swabs in vial transport medium  Result Value Ref Range   SARS Coronavirus 2 by RT PCR NEGATIVE NEGATIVE    Comment: (NOTE) SARS-CoV-2 target nucleic acids are NOT DETECTED.  The SARS-CoV-2 RNA is generally detectable in upper respiratory specimens during the acute phase of infection. The lowest concentration of SARS-CoV-2 viral copies this assay can detect is 138 copies/mL. A negative result does not preclude SARS-Cov-2 infection and should not be used as the sole basis for  treatment or other patient management decisions. A negative result may occur with  improper specimen collection/handling, submission of specimen other than nasopharyngeal swab, presence of viral mutation(s) within the areas targeted by this assay, and inadequate number of viral copies(<138 copies/mL). A negative result must be combined with clinical observations, patient history, and epidemiological information. The expected result is Negative.  Fact Sheet for Patients:  EntrepreneurPulse.com.au  Fact Sheet for Healthcare Providers:  IncredibleEmployment.be  This test is no t yet approved or cleared by the [place] FDA and  has been authorized for detection and/or diagnosis of SARS-CoV-2 by FDA under an Emergency Use Authorization (EUA). This EUA will remain  in effect (meaning this test can be used) for the duration of the COVID-19 declaration under Section 564(b)(1) of the Act, 21 U.S.C.section 360bbb-3(b)(1), unless the authorization is terminated  or revoked sooner.       Influenza A by PCR NEGATIVE NEGATIVE   Influenza B by PCR NEGATIVE NEGATIVE    Comment: (NOTE) The Xpert Xpress SARS-CoV-2/FLU/RSV plus assay is intended as an aid in the diagnosis of influenza from Nasopharyngeal swab specimens and should not be used as a sole basis for treatment. Nasal washings and aspirates are unacceptable for Xpert Xpress SARS-CoV-2/FLU/RSV testing.  Fact Sheet for Patients: EntrepreneurPulse.com.au  Fact Sheet  for Healthcare Providers: IncredibleEmployment.be  This test is not yet approved or cleared by the [place] and has been authorized for detection and/or diagnosis of SARS-CoV-2 by FDA under an Emergency Use Authorization (EUA). This EUA will remain in effect (meaning this test can be used) for the duration of the COVID-19 declaration under Section 564(b)(1) of the Act, 21 U.S.C. section  360bbb-3(b)(1), unless the authorization is terminated or revoked.  Performed at [facility], [address], [place], [place] [address]   Pathologist smear review     Status: None   Collection Time: 03/11/20 10:24 AM  Result Value Ref Range   Path Review Blood smear is reviewed.     Comment: There is thrombocytopenia without clumping. Macrocytic anemia is present, with circulating nucleated RBCs. There is neutropenia with ANC 300. No hypersegmented neutrophils are seen. There  are many atypical cells with varied appearances. Several blasts  are identified, and other large cells have a lymphoid appearance.  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 Although the cell count is low, flow cytometry  may be helpful at this time, and heme onc consultation is indicated. Reviewed by Lemmie Evens. Dicie Beam, MD. Performed at [facility], [address] [place], [place] [address]   Procalcitonin     Status: None   Collection Time: 03/11/20 10:24 AM  Result Value Ref Range   Procalcitonin 2.20 ng/mL    Comment:        Interpretation: PCT > 2 ng/mL: Systemic infection (sepsis) is likely, unless other causes are known. (NOTE)       Sepsis PCT Algorithm           Lower Respiratory Tract                                      Infection PCT Algorithm    ----------------------------     ----------------------------         PCT < 0.25 ng/mL                PCT < 0.10 ng/mL          Strongly encourage             Strongly discourage   discontinuation of antibiotics    initiation of antibiotics    ----------------------------     -----------------------------       PCT 0.25 - 0.50 ng/mL            PCT 0.10 - 0.25 ng/mL               OR       >80% decrease in PCT            Discourage initiation of                                             antibiotics      Encourage discontinuation           of antibiotics    ----------------------------     -----------------------------         PCT >= 0.50 ng/mL  PCT 0.26 - 0.50 ng/mL               AND       <80% decrease in PCT              Encourage initiation of                                             antibiotics       Encourage continuation           of antibiotics    ----------------------------     -----------------------------        PCT >= 0.50 ng/mL                  PCT > 0.50 ng/mL               AND         increase in PCT                  Strongly encourage                                      initiation of antibiotics    Strongly encourage escalation           of antibiotics                                     -----------------------------                                           PCT <= 0.25 ng/mL                                                 OR                                        > 80% decrease in PCT                                      Discontinue / Do not initiate                                             antibiotics  Performed at [facility], [address] [place], [place] [address]   Lactic acid, plasma     Status: Abnormal   Collection Time: 03/11/20 10:25 AM  Result Value Ref Range   Lactic Acid, Venous 3.3 (HH) 0.5 - 1.9 mmol/L    Comment: CRITICAL RESULT CALLED TO, READ BACK BY AND VERIFIED WITH SHERIE ALLISON 03/11/20 AT 1056 BY ACR Performed at [facility], [address], [place], [place] [address]  Lactic acid, plasma     Status: Abnormal   Collection Time: 03/11/20  1:04 PM  Result Value Ref Range   Lactic Acid, Venous 2.5 (HH) 0.5 - 1.9 mmol/L    Comment: CRITICAL VALUE NOTED. VALUE IS CONSISTENT WITH PREVIOUSLY REPORTED/CALLED VALUE MJU Performed at [facility], [address] [place], [place] [address]   CBG monitoring, ED     Status: Abnormal   Collection Time: 03/11/20   5:23 PM  Result Value Ref Range   Glucose-Capillary 166 (H) 70 - 99 mg/dL    Comment: Glucose reference range applies only to samples taken after fasting for at least 8 hours.  Lactic acid, plasma     Status: Abnormal   Collection Time: 03/11/20  5:25 PM  Result Value Ref Range   Lactic Acid, Venous 2.1 (HH) 0.5 - 1.9 mmol/L    Comment: CRITICAL VALUE NOTED. VALUE IS CONSISTENT WITH PREVIOUSLY REPORTED/CALLED VALUE MJU Performed at [facility], [place], [place] [address]   Troponin I (High Sensitivity)     Status: Abnormal   Collection Time: 03/11/20  5:25 PM  Result Value Ref Range   Troponin I (High Sensitivity) 125 (HH) <18 ng/L    Comment: CRITICAL RESULT CALLED TO, READ BACK BY AND VERIFIED WITH LUANN EFFERSON @1800  03/11/20 MJU (NOTE) Elevated high sensitivity troponin I (hsTnI) values and significant  changes across serial measurements may suggest ACS but many other  chronic and acute conditions are known to elevate hsTnI results.  Refer to the "Links" section for chest pain algorithms and additional  guidance. Performed at [facility], [address] [place], [place] [address]    DG Chest [place] 1 View  Result Date: 03/11/2020 CLINICAL DATA:  Shortness of breath and chills. EXAM: PORTABLE CHEST 1 VIEW COMPARISON:  10/14/2015 FINDINGS: 1014 hours. Interstitial and patchy airspace disease noted right lung apex. Interstitial markings are diffusely coarsened with chronic features. Interstitial opacity at the left base is stable, likely scarring. Cardiopericardial silhouette is at upper limits of normal for size. Permanent pacer/AICD again noted. Telemetry leads overlie the chest. IMPRESSION: Interstitial and patchy airspace disease in the right upper lobe suggests pneumonia. Electronically Signed   By: Misty Stanley M.D.   On: 03/11/2020 10:29    Assessment:  The patient is a 87 y.o.  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  He denies any aspiration event.    He has had progressive pancytopenia over the past several months with associated macrocytic RBC indices suggestive of a myelodysplastic syndrome.  He was diagnosed with B12 deficiency on 01/19/2020.  He has been on B12 supplementation every other week since that time.  Peripheral smear reveals thrombocytopenia, macrocytic anemia, circulating nucleated RBCs, neutropenia and hypersegmented neutrophils.  There were many atypical cells with varied appearances.  There were several blasts and other large cells having a lymphoid appearance.   Symptomatically, he is fatigued.  He describes fever and chills today.  Exam reveals no adenopathy or hepatosplenomegaly.  Plan:   1.   Stage II squamous cell carcinoma of the larynx  Patient undergoing radiation.  Treatment will be on hold during his hospitalization. 2.  Pancytopenia  Concern for a myelodysplastic syndrome with excess blasts or possible leukemia.  Peripheral blood for flow cytometry.  Supportive care with PRBCs and platelets as needed.   All blood products are leukopoor and irradiated.  Neutropenic precautions. 3.  RUL pneumonia  Patient does not provide a history of an aspiration event but at risk given radiation induced mucositis.  Patient pancultured and initiated on broad-spectrum antibiotics.  Blood cultures every 24 hours as needed temperature greater than or equal to 100.4.    Thank you for allowing me to participate in Thomas Morton 's care.  I will follow him closely with you while hospitalized and after discharge in the outpatient department.   Lequita Asal, MD  03/11/2020, 7:27 PM

## 2020-03-11 NOTE — Procedures (Signed)
Central Venous Catheter Insertion Procedure Note  Thomas Morton  [id number]  September 10, 1927  Date:03/11/20  Time:9:53 PM   Provider Performing:Annalea Alguire L Rust-Chester   Procedure: Insertion of Non-tunneled Central Venous [phone]) with US guidance ([id number])   Indication(s) Medication administration  Consent Risks of the procedure as well as the alternatives and risks of each were explained to the patient and/or caregiver.  Consent for the procedure was obtained and is signed in the bedside chart  Anesthesia Topical only with 1% lidocaine   Timeout Verified patient identification, verified procedure, site/side was marked, verified correct patient position, special equipment/implants available, medications/allergies/relevant history reviewed, required imaging and test results available.  Sterile Technique Maximal sterile technique including full sterile barrier drape, hand hygiene, sterile gown, sterile gloves, mask, hair covering, sterile ultrasound probe cover (if used).  Procedure Description Area of catheter insertion was cleaned with chlorhexidine and draped in sterile fashion.  With real-time ultrasound guidance a central venous catheter was placed into the right femoral vein. Nonpulsatile blood flow and easy flushing noted in all ports.  The catheter was sutured in place and sterile dressing applied.  Complications/Tolerance None; patient tolerated the procedure well. Chest X-ray is ordered to verify placement for internal jugular or subclavian cannulation.   Chest x-ray is not ordered for femoral cannulation.  EBL Minimal  Specimen(s) None   Due to the patient actively receiving radiation for Stage II squamous cell carcinoma of the larynx the internal jugular was deemed an inappropriate site and a Right femoral CVC 3 L was placed without complication.  Domingo Pulse Rust-Chester, AGACNP-BC Acute Care Nurse Practitioner [facility]    [phone] / [phone] Please see Amion for pager details.

## 2020-03-11 NOTE — Consult Note (Signed)
CODE SEPSIS - PHARMACY COMMUNICATION  **Broad Spectrum Antibiotics should be administered within 1 hour of Sepsis diagnosis**  Time Code Sepsis Called/Page Received: 1218  Antibiotics Ordered: 1100  Time of 1st antibiotic administration: 8099  Additional action taken by pharmacy: - Confirmed availability of abx with RN.  - Discussed abx consolidation of coverage with MD.  If necessary, Name of Provider/Nurse Contacted: Georgette Shell RN & Dr. Ivor Costa MD  Lorna Dibble ,PharmD Clinical Pharmacist  03/11/2020  12:46 PM

## 2020-03-11 NOTE — H&P (Signed)
History and Physical    Thomas Morton Thomas Morton DOB: [birth year] DOA: 03/11/2020  Referring MD/NP/PA:   PCP: Rusty Aus, MD   Patient coming from:  The patient is coming from home.  At baseline, pt is independent for most of ADL.        Chief Complaint: SOB  HPI: Thomas Morton is a 87 y.o. 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  Patient states that he started having shortness of breath and cough since this morning, which has been progressively worsening. Patient has subjective fever and chills. CP.  No nausea, vomiting, diarrhea, abdominal, symptoms of UTI.  Patient is currently doing radiation therapy for laryngeal cancer.  Last treatment was on Friday.  ED Course: pt was found to have pancytopenia (WBC 1.1, hemoglobin 9.0, platelet 75), troponin level 42, lactic acid is 3.3, negative Covid PCR, worsening renal function, temperature 99, soft blood pressure, tachycardia with heart rate of 130, RR 28, oxygen saturation 94% on 2 L oxygen, chest x-ray showed interstitial patchy infiltration in the right upper lobe opacity.  Patient is admitted to progressive bed as inpatient  Review of Systems:   General: has chills fevers, chills, no body weight gain, has fatigue HEENT: no blurry vision, hearing changes or sore throat Respiratory: Has dyspnea, coughing, no wheezing CV: no chest pain, no palpitations GI: no nausea, vomiting, abdominal pain, diarrhea, constipation GU: no dysuria, burning on urination, increased urinary frequency, hematuria  Ext: has trace leg edema Neuro: no unilateral weakness, numbness, or tingling, no vision change or  hearing loss Skin: no rash, no skin tear. MSK: No muscle spasm, no deformity, no limitation of range of movement in spin Heme: No easy bruising.  Travel history: No recent long distant travel.  Allergy:  Allergies  Allergen Reactions  . Sulfa Antibiotics Rash    Per patient low doses do not cause issues, only higher doses    Past Medical History:  Diagnosis Date  . Afib ([place])   . Aortic atherosclerosis ([place])   . Arthritis   . B12 deficiency   . Bilateral carotid artery stenosis   . Cancer [facility]    prostate 2001  . Cardiac defibrillator in situ   . CHF (congestive heart failure) ([place])   . Chronic gouty arthritis   . Diabetes mellitus without complication ([place])   . Diabetic sensorimotor neuropathy ([place])   . Emphysema lung ([place])   . GERD (gastroesophageal reflux disease)   . History of cardioembolic cerebrovascular accident (CVA)   . History of DVT (deep vein thrombosis)   . History of kidney stones    H/O  . History of prostatectomy   . Hyperlipidemia   . LBBB (left bundle branch block)   . NICM (nonischemic cardiomyopathy) [facility])     Past Surgical History:  Procedure Laterality Date  . CARDIAC DEFIBRILLATOR PLACEMENT    . DIRECT LARYNGOSCOPY N/A 01/31/2020   Procedure: MICRO DIRECT LARYNGOSCOPYWITH BIOPSY;  Surgeon: Clyde Canterbury, MD;  Location: [facility] ORS;  Service: ENT;  Laterality: N/A;  . eyelid surgery    . KNEE ARTHROSCOPY Bilateral   . PROSTATECTOMY      Social History:  reports that he quit smoking about 52 years ago. His smoking use included cigarettes.  He has a 30.00 pack-year smoking history. He has never used smokeless tobacco. He reports current alcohol use. He reports that he does not use drugs.  Family History:  Family History  Problem Relation Age of Onset  . Throat cancer Mother   . Diabetes Father      Prior to Admission medications   Medication Sig Start Date End Date Taking? Authorizing Provider  allopurinol (ZYLOPRIM) 300 MG tablet Take 300  mg by mouth at bedtime. 02/13/15   [provider]  aspirin EC 325 MG EC tablet Take 1 tablet (325 mg total) by mouth daily. 10/16/15   Theodoro Grist, MD  atorvastatin (LIPITOR) 40 MG tablet Take 1 tablet (40 mg total) by mouth daily at 6 PM. Patient taking differently: Take 40 mg by mouth every morning. 10/15/15   Theodoro Grist, MD  betamethasone dipropionate (DIPROLENE) 0.05 % cream Apply 1 application topically 2 (two) times daily as needed (irritation). 02/08/14   [provider]  budesonide (PULMICORT) 0.5 MG/2ML nebulizer solution Take 2 mLs (0.5 mg total) by nebulization 2 (two) times daily. Patient not taking: No sig reported 12/24/17   Epifanio Lesches, MD  carvedilol (COREG) 3.125 MG tablet Take 3.125 mg by mouth 2 (two) times daily. 02/13/15   [provider]  glimepiride (AMARYL) 1 MG tablet Take 1 tablet (1 mg total) by mouth daily with breakfast. Patient taking differently: Take 1 mg by mouth in the morning and at bedtime. 12/24/17 12/24/18  Epifanio Lesches, MD  HYDROcodone-acetaminophen (NORCO/VICODIN) 5-325 MG tablet Take 1-2 tablets by mouth every 6 (six) hours as needed for moderate pain. 01/31/20   Clyde Canterbury, MD  indapamide (LOZOL) 1.25 MG tablet Take 1.25 mg by mouth every morning. 12/24/14   [provider]  ipratropium-albuterol (DUONEB) 0.5-2.5 (3) MG/3ML SOLN Take 3 mLs by nebulization every 6 (six) hours. Patient taking differently: Take 3 mLs by nebulization every 6 (six) hours as needed (asthma). 12/24/17   Epifanio Lesches, MD  pantoprazole (PROTONIX) 40 MG tablet Take 1 tablet by mouth daily as needed (heartburn). 02/13/15   [provider]  triamcinolone (KENALOG) 0.025 % cream Apply 1 application topically daily as needed (irritation). 11/24/13   [provider]  vitamin B-12 (CYANOCOBALAMIN) 1000 MCG tablet Take 1,000 mcg by mouth daily.    [provider]    Physical Exam: Vitals:   03/11/20  1715 03/11/20 1730 03/11/20 1800 03/11/20 1815  BP:  (!) 141/66 (!) 88/37 (!) 88/39  Pulse:  99 69 94  Resp:  (!) 25 (!) 28 (!) 29  Temp: 98.6 F (37 C)     TempSrc: Oral     SpO2:  100% 100% 100%  Weight:      Height:       General: Not in acute distress HEENT:       Eyes: PERRL, EOMI, no scleral icterus.       ENT: No discharge from the ears and nose, no pharynx injection, no tonsillar enlargement.        Neck: No JVD, no bruit, no mass felt. Heme: No neck lymph node enlargement. Cardiac: S1/S2, RRR, No murmurs, No gallops or rubs. Respiratory: Has coarse breathing sound bilaterally GI: Soft, nondistended, nontender, no rebound pain, no organomegaly, BS present. GU: No hematuria Ext: has trace leg edema bilaterally. 1+DP/PT pulse bilaterally. Musculoskeletal: No joint deformities, No joint redness or warmth, no limitation of ROM in spin. Skin: No rashes.  Neuro: Alert, oriented X3, cranial nerves II-XII grossly intact,  moves all extremities. Psych: Patient is not psychotic, no suicidal or hemocidal ideation.  Labs on Admission: I have personally reviewed following labs and imaging studies  CBC: Recent Labs  Lab 03/06/20 1008 03/11/20 1024  WBC 1.2* 1.1*  NEUTROABS  --  0.3*  HGB 9.4* 9.0*  HCT 27.5* 26.7*  MCV 114.6* 113.6*  PLT 65* 75*   Basic Metabolic Panel: Recent Labs  Lab 03/11/20 1024  NA 136  K 4.5  CL 101  CO2 23  GLUCOSE 263*  BUN 28*  CREATININE 2.03*  CALCIUM 8.7*   GFR: Estimated Creatinine Clearance: 25 mL/min (A) (by C-G formula based on SCr of 2.03 mg/dL (H)). Liver Function Tests: Recent Labs  Lab 03/11/20 1024  AST 22  ALT 10  ALKPHOS 62  BILITOT 1.1  PROT 6.7  ALBUMIN 3.4*   No results for input(s): LIPASE, AMYLASE in the last 168 hours. No results for input(s): AMMONIA in the last 168 hours. Coagulation Profile: No results for input(s): INR, PROTIME in the last 168 hours. Cardiac Enzymes: No results for input(s): CKTOTAL,  CKMB, CKMBINDEX, TROPONINI in the last 168 hours. BNP (last 3 results) No results for input(s): PROBNP in the last 8760 hours. HbA1C: No results for input(s): HGBA1C in the last 72 hours. CBG: Recent Labs  Lab 03/11/20 1723  GLUCAP 166*   Lipid Profile: No results for input(s): CHOL, HDL, LDLCALC, TRIG, CHOLHDL, LDLDIRECT in the last 72 hours. Thyroid Function Tests: No results for input(s): TSH, T4TOTAL, FREET4, T3FREE, THYROIDAB in the last 72 hours. Anemia Panel: No results for input(s): VITAMINB12, FOLATE, FERRITIN, TIBC, IRON, RETICCTPCT in the last 72 hours. Urine analysis:    Component Value Date/Time   COLORURINE YELLOW (A) 12/22/2017 1323   APPEARANCEUR CLEAR (A) 12/22/2017 1323   LABSPEC 1.020 12/22/2017 1323   PHURINE 5.0 12/22/2017 1323   GLUCOSEU NEGATIVE 12/22/2017 1323   HGBUR NEGATIVE 12/22/2017 1323   BILIRUBINUR NEGATIVE 12/22/2017 1323   KETONESUR NEGATIVE 12/22/2017 1323   PROTEINUR NEGATIVE 12/22/2017 1323   NITRITE NEGATIVE 12/22/2017 1323   LEUKOCYTESUR NEGATIVE 12/22/2017 1323   Sepsis Labs: @LABRCNTIP (procalcitonin:4,lacticidven:4) ) Recent Results (from the past 240 hour(s))  Resp Panel by RT-PCR (Flu A&B, Covid) Nasopharyngeal Swab     Status: None   Collection Time: 03/11/20 10:24 AM   Specimen: Nasopharyngeal Swab; Nasopharyngeal(NP) swabs in vial transport medium  Result Value Ref Range Status   SARS Coronavirus 2 by RT PCR NEGATIVE NEGATIVE Final    Comment: (NOTE) SARS-CoV-2 target nucleic acids are NOT DETECTED.  The SARS-CoV-2 RNA is generally detectable in upper respiratory specimens during the acute phase of infection. The lowest concentration of SARS-CoV-2 viral copies this assay can detect is 138 copies/mL. A negative result does not preclude SARS-Cov-2 infection and should not be used as the sole basis for treatment or other patient management decisions. A negative result may occur with  improper specimen collection/handling,  submission of specimen other than nasopharyngeal swab, presence of viral mutation(s) within the areas targeted by this assay, and inadequate number of viral copies(<138 copies/mL). A negative result must be combined with clinical observations, patient history, and epidemiological information. The expected result is Negative.  Fact Sheet for Patients:  EntrepreneurPulse.com.au  Fact Sheet for Healthcare Providers:  IncredibleEmployment.be  This test is no t yet approved or cleared by the [place] FDA and  has been authorized for detection and/or diagnosis of SARS-CoV-2 by FDA under an Emergency Use Authorization (EUA). This EUA will remain  in  effect (meaning this test can be used) for the duration of the COVID-19 declaration under Section 564(b)(1) of the Act, 21 U.S.C.section 360bbb-3(b)(1), unless the authorization is terminated  or revoked sooner.       Influenza A by PCR NEGATIVE NEGATIVE Final   Influenza B by PCR NEGATIVE NEGATIVE Final    Comment: (NOTE) The Xpert Xpress SARS-CoV-2/FLU/RSV plus assay is intended as an aid in the diagnosis of influenza from Nasopharyngeal swab specimens and should not be used as a sole basis for treatment. Nasal washings and aspirates are unacceptable for Xpert Xpress SARS-CoV-2/FLU/RSV testing.  Fact Sheet for Patients: EntrepreneurPulse.com.au  Fact Sheet for Healthcare Providers: IncredibleEmployment.be  This test is not yet approved or cleared by the [place] FDA and has been authorized for detection and/or diagnosis of SARS-CoV-2 by FDA under an Emergency Use Authorization (EUA). This EUA will remain in effect (meaning this test can be used) for the duration of the COVID-19 declaration under Section 564(b)(1) of the Act, 21 U.S.C. section 360bbb-3(b)(1), unless the authorization is terminated or revoked.  Performed at [facility], [address] [place], [place] [address]      Radiological Exams on Admission: DG Chest [facility] 1 View  Result Date: 03/11/2020 CLINICAL DATA:  Shortness of breath and chills. EXAM: PORTABLE CHEST 1 VIEW COMPARISON:  10/14/2015 FINDINGS: 1014 hours. Interstitial and patchy airspace disease noted right lung apex. Interstitial markings are diffusely coarsened with chronic features. Interstitial opacity at the left base is stable, likely scarring. Cardiopericardial silhouette is at upper limits of normal for size. Permanent pacer/AICD again noted. Telemetry leads overlie the chest. IMPRESSION: Interstitial and patchy airspace disease in the right upper lobe suggests pneumonia. Electronically Signed   By: Misty Stanley M.D.   On: 03/11/2020 10:29     EKG: I have personally reviewed.  Seems to be sinus rhythm, QTC 514, tachycardia, poor R wave progression, right axis deviation.  Assessment/Plan Principal Problem:   Acute respiratory failure with hypoxia (HCC) Active Problems:   CVA (cerebral vascular accident) ([place])   Essential hypertension   Aspiration pneumonia ([place])   HCAP (healthcare-associated pneumonia)   Severe sepsis with septic shock (HCC)   Acute renal failure superimposed on stage 3a chronic kidney disease (HCC)   GERD (gastroesophageal reflux disease)   Chronic gouty arthritis   History of cardioembolic cerebrovascular accident (CVA)   Hyperlipidemia   Type II diabetes mellitus with renal manifestations (HCC)   Atrial fibrillation, chronic (HCC)   Pancytopenia (HCC)   Elevated troponin   Larynx cancer (HCC)   Acute respiratory failure with hypoxia and severe sepsis with septic shock due to possible aspiration pneumonia versus HCAP: Patient has a severe sepsis with septic shock.  After giving 2.5 L normal saline, blood pressure still dropped to 60s.  Lactic acid 3.3, tachycardia with heart rate of 130, RR 28, WBC 1.1.  Dr. Mortimer Fries of ICU is consulted.  IV Levophed drip  started.  -Admitted to ICU  as inpatient - IV Levophed - IV Vancomycin and Linezolid per pharmacist recommendation - Mucinex for cough  - Bronchodilators - Urine legionella and S. pneumococcal antigen - Follow up blood culture x2, sputum culture - will get Procalcitonin and trend lactic acid level per sepsis protocol - IVF: 3.5L of NS bolus in ED - SLP  CVA (cerebral vascular accident) ([place]) -ASA and lipitor  Essential hypertension -Hold blood pressure medications  Acute renal failure superimposed on stage 3a chronic kidney disease ([place]): Baseline creatinine 1.1-1.3.  His creatinine is 2.03, BUN 28.  Likely due to dehydration and continuation of diuretics.  ATN may have contributed partially. -Hold diuretics -IV fluid as above  GERD (gastroesophageal reflux disease) -Protonix and Pepcid  Chronic gouty arthritis -Allopurinol  History of cardioembolic cerebrovascular accident (CVA) -Aspirin and Lipitor  Hyperlipidemia -Lipitor  Type II diabetes mellitus with renal manifestations [facility]): Recent A1c 5.8, well controlled.  Patient is taking Amaryl -Sliding scale insulin  Atrial fibrillation, chronic ([place]): Heart rate 130 --> 98 -Hold Coreg due to hypotension  Pancytopenia ([place]): Etiology is not clear.  Dr. Rogue Bussing reviewed blood smear.  Per Dr. Lynett Fish, pancytopenia is likely due to neoplastic process.  He will let Dr. Olene Craven know this and follow up -f/u with CBC  Elevated troponin: trop 42.  Likely demand ischemia -Trend troponin -Aspirin, Lipitor -Check A1c, FLP  Larynx cancer ([place]) -On radiation therapy -Follow-up Dr. Richardson Landry         DVT ppx: SCD Code Status: No CPR or intubation, but can use vasopressor per his son Family Communication:  Yes, patient's son   at bed side Disposition Plan:  Anticipate discharge back to previous environment Consults called:  none Admission status and Level of care: Stepdown:   as inpt            Status is:  Inpatient  Remains inpatient appropriate because:Inpatient level of care appropriate due to severity of illness   Dispo: The patient is from: Home              Anticipated d/c is to: Home              Patient currently is not medically stable to d/c.   Difficult to place patient No           Date of Service 03/11/2020    [facility]   If 7PM-7AM, please contact night-coverage [email or web address] 03/11/2020, 7:06 PM

## 2020-03-11 NOTE — Progress Notes (Signed)
elink monitoring sepsis 

## 2020-03-12 ENCOUNTER — Ambulatory Visit: Payer: Medicare HMO

## 2020-03-12 ENCOUNTER — Inpatient Hospital Stay: Payer: Medicare HMO | Admitting: Internal Medicine

## 2020-03-12 DIAGNOSIS — J9601 Acute respiratory failure with hypoxia: Secondary | ICD-10-CM | POA: Diagnosis not present

## 2020-03-12 LAB — STREP PNEUMONIAE URINARY ANTIGEN: Strep Pneumo Urinary Antigen: NEGATIVE

## 2020-03-12 LAB — LACTIC ACID, PLASMA: Lactic Acid, Venous: 1.1 mmol/L (ref 0.5–1.9)

## 2020-03-12 LAB — CBC WITH DIFFERENTIAL/PLATELET
Abs Immature Granulocytes: 0.05 10*3/uL (ref 0.00–0.07)
Basophils Absolute: 0 10*3/uL (ref 0.0–0.1)
Basophils Relative: 0 %
Eosinophils Absolute: 0 10*3/uL (ref 0.0–0.5)
Eosinophils Relative: 1 %
HCT: 20.5 % — ABNORMAL LOW (ref 39.0–52.0)
Hemoglobin: 7.1 g/dL — ABNORMAL LOW (ref 13.0–17.0)
Immature Granulocytes: 7 %
Lymphocytes Relative: 34 %
Lymphs Abs: 0.2 10*3/uL — ABNORMAL LOW (ref 0.7–4.0)
MCH: 39.7 pg — ABNORMAL HIGH (ref 26.0–34.0)
MCHC: 34.6 g/dL (ref 30.0–36.0)
MCV: 114.5 fL — ABNORMAL HIGH (ref 80.0–100.0)
Monocytes Absolute: 0.3 10*3/uL (ref 0.1–1.0)
Monocytes Relative: 41 %
Neutro Abs: 0.1 10*3/uL — CL (ref 1.7–7.7)
Neutrophils Relative %: 17 %
Platelets: 54 10*3/uL — ABNORMAL LOW (ref 150–400)
RBC: 1.79 MIL/uL — ABNORMAL LOW (ref 4.22–5.81)
RDW: 15.3 % (ref 11.5–15.5)
Smear Review: NORMAL
WBC: 0.7 10*3/uL — CL (ref 4.0–10.5)
nRBC: 0 % (ref 0.0–0.2)

## 2020-03-12 LAB — HEMOGLOBIN A1C
Hgb A1c MFr Bld: 6.5 % — ABNORMAL HIGH (ref 4.8–5.6)
Mean Plasma Glucose: 139.85 mg/dL

## 2020-03-12 LAB — COMPREHENSIVE METABOLIC PANEL
ALT: 12 U/L (ref 0–44)
AST: 20 U/L (ref 15–41)
Albumin: 2.5 g/dL — ABNORMAL LOW (ref 3.5–5.0)
Alkaline Phosphatase: 40 U/L (ref 38–126)
Anion gap: 5 (ref 5–15)
BUN: 25 mg/dL — ABNORMAL HIGH (ref 8–23)
CO2: 23 mmol/L (ref 22–32)
Calcium: 7.3 mg/dL — ABNORMAL LOW (ref 8.9–10.3)
Chloride: 109 mmol/L (ref 98–111)
Creatinine, Ser: 1.5 mg/dL — ABNORMAL HIGH (ref 0.61–1.24)
GFR, Estimated: 44 mL/min — ABNORMAL LOW (ref >60)
Glucose, Bld: 176 mg/dL — ABNORMAL HIGH (ref 70–99)
Potassium: 3.6 mmol/L (ref 3.5–5.1)
Sodium: 137 mmol/L (ref 135–145)
Total Bilirubin: 0.7 mg/dL (ref 0.3–1.2)
Total Protein: 5.3 g/dL — ABNORMAL LOW (ref 6.5–8.1)

## 2020-03-12 LAB — BLOOD CULTURE ID PANEL (REFLEXED) - BCID2

## 2020-03-12 LAB — MAGNESIUM: Magnesium: 1.2 mg/dL — ABNORMAL LOW (ref 1.7–2.4)

## 2020-03-12 LAB — LIPID PANEL
Cholesterol: 69 mg/dL (ref 0–200)
HDL: 32 mg/dL — ABNORMAL LOW (ref >40)
LDL Cholesterol: 29 mg/dL (ref 0–99)
Total CHOL/HDL Ratio: 2.2 RATIO
Triglycerides: 42 mg/dL (ref <150)
VLDL: 8 mg/dL (ref 0–40)

## 2020-03-12 LAB — GLUCOSE, CAPILLARY
Glucose-Capillary: 159 mg/dL — ABNORMAL HIGH (ref 70–99)
Glucose-Capillary: 225 mg/dL — ABNORMAL HIGH (ref 70–99)
Glucose-Capillary: 183 mg/dL — ABNORMAL HIGH (ref 70–99)
Glucose-Capillary: 131 mg/dL — ABNORMAL HIGH (ref 70–99)

## 2020-03-12 LAB — URIC ACID: Uric Acid, Serum: 3.5 mg/dL — ABNORMAL LOW (ref 3.7–8.6)

## 2020-03-12 LAB — PHOSPHORUS: Phosphorus: 2.4 mg/dL — ABNORMAL LOW (ref 2.5–4.6)

## 2020-03-12 LAB — FOLATE: Folate: 9.9 ng/mL (ref >5.9)

## 2020-03-12 MED ORDER — MAGNESIUM SULFATE 4 GM/100ML IV SOLN
4.0000 g | Freq: Once | INTRAVENOUS | Status: AC
  Administered 2020-03-12: 4 g via INTRAVENOUS
  Filled 2020-03-12: qty 100

## 2020-03-12 MED ORDER — PHENOL 1.4 % MT LIQD
1.0000 | OROMUCOSAL | Status: DC | PRN
  Administered 2020-03-12: 1 via OROMUCOSAL
  Filled 2020-03-12: qty 177

## 2020-03-12 MED ORDER — LACTATED RINGERS IV SOLN: INTRAVENOUS | Status: DC

## 2020-03-12 MED ORDER — MIDODRINE HCL 5 MG PO TABS
5.0000 mg | ORAL_TABLET | Freq: Three times a day (TID) | ORAL | Status: DC
  Administered 2020-03-12 – 2020-03-14 (×7): 5 mg via ORAL
  Filled 2020-03-12 (×7): qty 1

## 2020-03-12 NOTE — Progress Notes (Signed)
PHARMACY - PHYSICIAN COMMUNICATION CRITICAL VALUE ALERT - BLOOD CULTURE IDENTIFICATION (BCID)  Thomas Morton is an 87 y.o. male who presented to [facility] on 03/11/2020 with a chief complaint of shortness of breath  Assessment:  Admitted for possible pneumonia.  2/28 blood culture with GPC in 1 of 4 bottles  Name of physician (or Provider) Contacted: Dr Reesa Chew and Dr Mortimer Fries  Current antibiotics: piperacillin/tazobactam  Changes to prescribed antibiotics recommended:  Continue piperacillin/tazobactam for PNA,  Blood culture likely contaminant  Results for orders placed or performed during the hospital encounter of 03/11/20  Blood Culture ID Panel (Reflexed) (Collected: 03/11/2020 10:25 AM)  Result Value Ref Range   Enterococcus faecalis NOT DETECTED NOT DETECTED   Enterococcus Faecium NOT DETECTED NOT DETECTED   Listeria monocytogenes NOT DETECTED NOT DETECTED   Staphylococcus species DETECTED (A) NOT DETECTED   Staphylococcus aureus (BCID) NOT DETECTED NOT DETECTED   Staphylococcus epidermidis NOT DETECTED NOT DETECTED   Staphylococcus lugdunensis NOT DETECTED NOT DETECTED   Streptococcus species NOT DETECTED NOT DETECTED   Streptococcus agalactiae NOT DETECTED NOT DETECTED   Streptococcus pneumoniae NOT DETECTED NOT DETECTED   Streptococcus pyogenes NOT DETECTED NOT DETECTED   A.calcoaceticus-baumannii NOT DETECTED NOT DETECTED   Bacteroides fragilis NOT DETECTED NOT DETECTED   Enterobacterales NOT DETECTED NOT DETECTED   Enterobacter cloacae complex NOT DETECTED NOT DETECTED   Escherichia coli NOT DETECTED NOT DETECTED   Klebsiella aerogenes NOT DETECTED NOT DETECTED   Klebsiella oxytoca NOT DETECTED NOT DETECTED   Klebsiella pneumoniae NOT DETECTED NOT DETECTED   Proteus species NOT DETECTED NOT DETECTED   Salmonella species NOT DETECTED NOT DETECTED   Serratia marcescens NOT DETECTED NOT DETECTED   Haemophilus influenzae NOT DETECTED NOT DETECTED   Neisseria meningitidis NOT  DETECTED NOT DETECTED   Pseudomonas aeruginosa NOT DETECTED NOT DETECTED   Stenotrophomonas maltophilia NOT DETECTED NOT DETECTED   Candida albicans NOT DETECTED NOT DETECTED   Candida auris NOT DETECTED NOT DETECTED   Candida glabrata NOT DETECTED NOT DETECTED   Candida krusei NOT DETECTED NOT DETECTED   Candida parapsilosis NOT DETECTED NOT DETECTED   Candida tropicalis NOT DETECTED NOT DETECTED   Cryptococcus neoformans/gattii NOT DETECTED NOT DETECTED    Doreene Eland, PharmD, BCPS.   Work Cell: [phone] 03/12/2020 10:24 AM

## 2020-03-12 NOTE — Progress Notes (Signed)
PROGRESS NOTE    Thomas Morton  ZYS:[id number] DOB: May 04, 1927 DOA: 03/11/2020 PCP: Rusty Aus, MD   Brief Narrative: Taken from H&P and prior notes. Thomas Morton is a 87 y.o. 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  Patient was having some subjective fever and chills.  He is currently undergoing radiation therapy for laryngeal cancer last treatment was on Friday. On arrival he was found to have pancytopenia, mildly elevated troponin, lactic acid 3.3, negative Covid PCR, AKI, tachycardic and tachypneic and chest x-ray concerning for interstitial patchy infiltrate in the right upper lobe. Admitted for Severe Sepsis with Septic Shock, did not responded well to IV fluid and started on Levophed.  Transitioned off from Levophed now. Started on broad-spectrum antibiotics with Zosyn and linezolid due to neutropenia. Hematology/oncology was also consulted and blood smear concerning for another possible leukemia or myelodysplastic syndrome with excess blasts.  Subjective: Patient was feeling little improved when seen today.  Quite hoarse voice secondary to laryngeal cancer.  Two sons at bedside. Patient does not use oxygen at baseline.  Assessment & Plan:   Principal Problem:   Acute respiratory failure with hypoxia (HCC) Active Problems:   CVA (cerebral vascular accident) ([place])   Essential hypertension   Aspiration pneumonia ([place])   HCAP (healthcare-associated pneumonia)   Severe sepsis with septic shock (HCC)   Acute renal failure superimposed on stage 3a chronic kidney disease (HCC)   GERD (gastroesophageal reflux disease)   Chronic gouty arthritis    History of cardioembolic cerebrovascular accident (CVA)   Hyperlipidemia   Type II diabetes mellitus with renal manifestations (HCC)   Atrial fibrillation, chronic (HCC)   Pancytopenia (HCC)   Elevated troponin   Larynx cancer ([place])  Sepsis with septic shock secondary to RUL pneumonia.  Patient is high risk for other superadded infection secondary to pancytopenia and neutropenia. Initially started on Levophed.  Blood pressure remained little soft but maintaining MAP at 60.  Blood cultures 1/4 with staphylococcal species, pending further verification, can be a contaminant.  MRSA PCR negative, strep pneumo urinary antigen negative, Legionella pending, lactic acidosis resolved -Continue with linezolid and Zosyn. -Start him on midodrine-to maintain MAP above 60.  Laryngeal cancer.  Patient is undergoing radiation-according to hematology note that will be held until he recovered from current illness.  Pancytopenia.  Seems worsening.  There is some concern of myelodysplastic syndrome with blast versus acute leukemia.  Oncology is on board-appreciate their help. -Continue broad-spectrum antibiotics. -Blood/platelet transfusion as needed with completely irradiated products. -Flow cytometry panel pending.  AKI with CKD stage IIIa.  Baseline creatinine around 1.1-1.3, some improvement today with IV fluid.  Creatinine at 1.5. -Continue to monitor. -Gentle IV fluid -Avoid nephrotoxins  Elevated troponin.  No chest pain.  Most likely secondary to demand ischemia, peaked at 125, with a flat curve now. -Continue aspirin and Lipitor  Essential hypertension.  Blood pressure soft. -Starting him on midodrine -Continue holding home antihypertensives.  GERD (gastroesophageal reflux disease) -Protonix and Pepcid  Chronic gouty arthritis -Allopurinol  History of cardioembolic cerebrovascular accident (CVA) -Aspirin and Lipitor  Hyperlipidemia -Lipitor  Type II diabetes mellitus with renal  manifestations [facility]): Recent A1c 5.8, well controlled.  Patient is taking Amaryl -Sliding scale insulin  Atrial  fibrillation, chronic [facility]): Heart rate 130 --> 98 -Hold Coreg due to hypotension  Objective: Vitals:   03/12/20 0900 03/12/20 1000 03/12/20 1100 03/12/20 1122  BP: (!) 126/59 (!) 105/45 (!) 104/40   Pulse: 100 98 89 96  Resp: (!) 22 (!) 27 (!) 26 (!) 25  Temp:      TempSrc:      SpO2: 99% 100% 100% 100%  Weight:      Height:        Intake/Output Summary (Last 24 hours) at 03/12/2020 1348 Last data filed at 03/12/2020 1100 Gross per 24 hour  Intake 1057.39 ml  Output 600 ml  Net 457.39 ml   Filed Weights   03/11/20 0951 03/11/20 2040  Weight: 81.2 kg 82.7 kg    Examination:  General exam: Pleasant elderly man,appears calm and comfortable  Respiratory system: Clear to auscultation. Respiratory effort normal. Cardiovascular system: S1 & S2 heard, RRR. Gastrointestinal system: Soft, nontender, nondistended, bowel sounds positive. Central nervous system: Alert and oriented. No focal neurological deficits. Extremities: No edema, no cyanosis, pulses intact and symmetrical. Psychiatry: Judgement and insight appear normal. Mood & affect appropriate.    DVT prophylaxis: SCDs Code Status: DNR Family Communication: 2 sons were updated at bedside. Disposition Plan:  Status is: Inpatient  Remains inpatient appropriate because:Inpatient level of care appropriate due to severity of illness   Dispo: The patient is from: Home              Anticipated d/c is to: Home              Patient currently is not medically stable to d/c.   Difficult to place patient No               Level of care: Med-Surg  All the records are reviewed and case discussed with Care Management/Social Worker. Management plans discussed with the patient, nursing and they are in agreement.  Consultants:   PCCM  Hematology/oncology  Procedures:  Antimicrobials:  Linezolid Zosyn  Data  Reviewed: I have personally reviewed following labs and imaging studies  CBC: Recent Labs  Lab 03/06/20 1008 03/11/20 1024 03/12/20 0523  WBC 1.2* 1.1* 0.7*  NEUTROABS  --  0.3* 0.1*  HGB 9.4* 9.0* 7.1*  HCT 27.5* 26.7* 20.5*  MCV 114.6* 113.6* 114.5*  PLT 65* 75* 54*   Basic Metabolic Panel: Recent Labs  Lab 03/11/20 1024 03/12/20 0523  NA 136 137  K 4.5 3.6  CL 101 109  CO2 23 23  GLUCOSE 263* 176*  BUN 28* 25*  CREATININE 2.03* 1.50*  CALCIUM 8.7* 7.3*  MG  --  1.2*  PHOS  --  2.4*   GFR: Estimated Creatinine Clearance: 33.8 mL/min (A) (by C-G formula based on SCr of 1.5 mg/dL (H)). Liver Function Tests: Recent Labs  Lab 03/11/20 1024 03/12/20 0523  AST 22 20  ALT 10 12  ALKPHOS 62 40  BILITOT 1.1 0.7  PROT 6.7 5.3*  ALBUMIN 3.4* 2.5*   No results for input(s): LIPASE, AMYLASE in the last 168 hours. No results for input(s): AMMONIA in the last 168 hours. Coagulation Profile: No results for input(s): INR, PROTIME in the last 168 hours. Cardiac Enzymes: No results for input(s): CKTOTAL, CKMB, CKMBINDEX, TROPONINI in the last 168 hours. BNP (last 3 results) No results for input(s): PROBNP in the last 8760 hours. HbA1C: Recent Labs    03/12/20 0523  HGBA1C 6.5*   CBG: Recent Labs  Lab 03/11/20 1723 03/11/20 2024 03/12/20  0735 03/12/20 1138  GLUCAP 166* 152* 159* 225*   Lipid Profile: Recent Labs    03/12/20 0523  CHOL 69  HDL 32*  LDLCALC 29  TRIG 42  CHOLHDL 2.2   Thyroid Function Tests: No results for input(s): TSH, T4TOTAL, FREET4, T3FREE, THYROIDAB in the last 72 hours. Anemia Panel: Recent Labs    03/12/20 0523  FOLATE 9.9   Sepsis Labs: Recent Labs  Lab 03/11/20 1024 03/11/20 1025 03/11/20 1304 03/11/20 1725 03/12/20 0523  PROCALCITON 2.20  --   --   --   --   LATICACIDVEN  --  3.3* 2.5* 2.1* 1.1    Recent Results (from the past 240 hour(s))  Resp Panel by RT-PCR (Flu A&B, Covid) Nasopharyngeal Swab     Status:  None   Collection Time: 03/11/20 10:24 AM   Specimen: Nasopharyngeal Swab; Nasopharyngeal(NP) swabs in vial transport medium  Result Value Ref Range Status   SARS Coronavirus 2 by RT PCR NEGATIVE NEGATIVE Final    Comment: (NOTE) SARS-CoV-2 target nucleic acids are NOT DETECTED.  The SARS-CoV-2 RNA is generally detectable in upper respiratory specimens during the acute phase of infection. The lowest concentration of SARS-CoV-2 viral copies this assay can detect is 138 copies/mL. A negative result does not preclude SARS-Cov-2 infection and should not be used as the sole basis for treatment or other patient management decisions. A negative result may occur with  improper specimen collection/handling, submission of specimen other than nasopharyngeal swab, presence of viral mutation(s) within the areas targeted by this assay, and inadequate number of viral copies(<138 copies/mL). A negative result must be combined with clinical observations, patient history, and epidemiological information. The expected result is Negative.  Fact Sheet for Patients:  EntrepreneurPulse.com.au  Fact Sheet for Healthcare Providers:  IncredibleEmployment.be  This test is no t yet approved or cleared by the [place] FDA and  has been authorized for detection and/or diagnosis of SARS-CoV-2 by FDA under an Emergency Use Authorization (EUA). This EUA will remain  in effect (meaning this test can be used) for the duration of the COVID-19 declaration under Section 564(b)(1) of the Act, 21 U.S.C.section 360bbb-3(b)(1), unless the authorization is terminated  or revoked sooner.       Influenza A by PCR NEGATIVE NEGATIVE Final   Influenza B by PCR NEGATIVE NEGATIVE Final    Comment: (NOTE) The Xpert Xpress SARS-CoV-2/FLU/RSV plus assay is intended as an aid in the diagnosis of influenza from Nasopharyngeal swab specimens and should not be used as a sole basis for  treatment. Nasal washings and aspirates are unacceptable for Xpert Xpress SARS-CoV-2/FLU/RSV testing.  Fact Sheet for Patients: EntrepreneurPulse.com.au  Fact Sheet for Healthcare Providers: IncredibleEmployment.be  This test is not yet approved or cleared by the [place] FDA and has been authorized for detection and/or diagnosis of SARS-CoV-2 by FDA under an Emergency Use Authorization (EUA). This EUA will remain in effect (meaning this test can be used) for the duration of the COVID-19 declaration under Section 564(b)(1) of the Act, 21 U.S.C. section 360bbb-3(b)(1), unless the authorization is terminated or revoked.  Performed at [facility], [address] [place], [place] [address]   Culture, blood (routine x 2)     Status: None (Preliminary result)   Collection Time: 03/11/20 10:25 AM   Specimen: BLOOD  Result Value Ref Range Status   Specimen Description BLOOD BLOOD LEFT WRIST  Final   Special Requests   Final    BOTTLES DRAWN AEROBIC AND ANAEROBIC Blood  Culture adequate volume   Culture   Final    NO GROWTH < 24 HOURS Performed at [facility], [address] [place], [place] [address]    Report Status PENDING  Incomplete  Culture, blood (routine x 2)     Status: None (Preliminary result)   Collection Time: 03/11/20 10:25 AM   Specimen: BLOOD  Result Value Ref Range Status   Specimen Description BLOOD LEFT ANTECUBITAL  Final   Special Requests   Final    BOTTLES DRAWN AEROBIC AND ANAEROBIC Blood Culture adequate volume   Culture  Setup Time   Final    Organism ID to follow GRAM POSITIVE COCCI AEROBIC BOTTLE ONLY CRITICAL RESULT CALLED TO, READ BACK BY AND VERIFIED WITH: SUSAN WATSON AT 1003 03/12/20 [place] Performed at [facility], [address] [place], [place] [address]    Culture GRAM POSITIVE COCCI  Final   Report Status PENDING  Incomplete  Blood Culture ID Panel (Reflexed)     Status:  Abnormal   Collection Time: 03/11/20 10:25 AM  Result Value Ref Range Status   Enterococcus faecalis NOT DETECTED NOT DETECTED Final   Enterococcus Faecium NOT DETECTED NOT DETECTED Final   Listeria monocytogenes NOT DETECTED NOT DETECTED Final   Staphylococcus species DETECTED (A) NOT DETECTED Final    Comment: CRITICAL RESULT CALLED TO, READ BACK BY AND VERIFIED WITH:  SUSAN WATSON AT 1003 03/12/20 SDR    Staphylococcus aureus (BCID) NOT DETECTED NOT DETECTED Final   Staphylococcus epidermidis NOT DETECTED NOT DETECTED Final   Staphylococcus lugdunensis NOT DETECTED NOT DETECTED Final   Streptococcus species NOT DETECTED NOT DETECTED Final   Streptococcus agalactiae NOT DETECTED NOT DETECTED Final   Streptococcus pneumoniae NOT DETECTED NOT DETECTED Final   Streptococcus pyogenes NOT DETECTED NOT DETECTED Final   A.calcoaceticus-baumannii NOT DETECTED NOT DETECTED Final   Bacteroides fragilis NOT DETECTED NOT DETECTED Final   Enterobacterales NOT DETECTED NOT DETECTED Final   Enterobacter cloacae complex NOT DETECTED NOT DETECTED Final   Escherichia coli NOT DETECTED NOT DETECTED Final   Klebsiella aerogenes NOT DETECTED NOT DETECTED Final   Klebsiella oxytoca NOT DETECTED NOT DETECTED Final   Klebsiella pneumoniae NOT DETECTED NOT DETECTED Final   Proteus species NOT DETECTED NOT DETECTED Final   Salmonella species NOT DETECTED NOT DETECTED Final   Serratia marcescens NOT DETECTED NOT DETECTED Final   Haemophilus influenzae NOT DETECTED NOT DETECTED Final   Neisseria meningitidis NOT DETECTED NOT DETECTED Final   Pseudomonas aeruginosa NOT DETECTED NOT DETECTED Final   Stenotrophomonas maltophilia NOT DETECTED NOT DETECTED Final   Candida albicans NOT DETECTED NOT DETECTED Final   Candida auris NOT DETECTED NOT DETECTED Final   Candida glabrata NOT DETECTED NOT DETECTED Final   Candida krusei NOT DETECTED NOT DETECTED Final   Candida parapsilosis NOT DETECTED NOT DETECTED Final    Candida tropicalis NOT DETECTED NOT DETECTED Final   Cryptococcus neoformans/gattii NOT DETECTED NOT DETECTED Final    Comment: Performed at [facility], [address] [place], [place] [address]  MRSA PCR Screening     Status: None   Collection Time: 03/11/20  5:25 PM   Specimen: Nasal Mucosa; Nasopharyngeal  Result Value Ref Range Status   MRSA by PCR NEGATIVE NEGATIVE Final    Comment:        The GeneXpert MRSA Assay (FDA approved for NASAL specimens only), is one component of a comprehensive MRSA colonization surveillance program. It is not intended to diagnose MRSA  infection nor to guide or monitor treatment for MRSA infections. Performed at [facility], [address] [place], [place] [address]      Radiology Studies: [facility] Chest [place] 1 View  Result Date: 03/11/2020 CLINICAL DATA:  Shortness of breath and chills. EXAM: PORTABLE CHEST 1 VIEW COMPARISON:  10/14/2015 FINDINGS: 1014 hours. Interstitial and patchy airspace disease noted right lung apex. Interstitial markings are diffusely coarsened with chronic features. Interstitial opacity at the left base is stable, likely scarring. Cardiopericardial silhouette is at upper limits of normal for size. Permanent pacer/AICD again noted. Telemetry leads overlie the chest. IMPRESSION: Interstitial and patchy airspace disease in the right upper lobe suggests pneumonia. Electronically Signed   By: Misty Stanley M.D.   On: 03/11/2020 10:29    Scheduled Meds: . allopurinol  300 mg Oral QHS  . aspirin  325 mg Oral Daily  . atorvastatin  40 mg Oral [id number]  . chlorhexidine  15 mL Mouth Rinse BID  . Chlorhexidine Gluconate Cloth  6 each Topical Daily  . famotidine  20 mg Oral QHS  . insulin aspart  0-5 Units Subcutaneous QHS  . insulin aspart  0-9 Units Subcutaneous TID WC  . ipratropium-albuterol  3 mL Nebulization Q4H  . mouth rinse  15 mL Mouth Rinse q12n4p   Continuous Infusions: . sodium chloride 250 mL  (03/11/20 2200)  . piperacillin-tazobactam (ZOSYN)  IV Stopped (03/12/20 0948)     LOS: 1 day   Time spent: 45 minutes. More than 50% of the time was spent in counseling/coordination of care  Lorella Nimrod, MD [facility]  If 7PM-7AM, please contact night-coverage [email or web address]  03/12/2020, 1:48 PM   This record has been created using Systems analyst. Errors have been sought and corrected,but may not always be located. Such creation errors do not reflect on the standard of care.

## 2020-03-12 NOTE — Progress Notes (Signed)
Patient arrived from ICU. Son at the bedside

## 2020-03-12 NOTE — Evaluation (Signed)
Clinical/Bedside Swallow Evaluation Patient Details  Name: Thomas Morton MRN: [id number] Date of Birth: [birth year]  Today's Date: 03/12/2020 Time: SLP Start Time (ACUTE ONLY): 23 SLP Stop Time (ACUTE ONLY): 1330 SLP Time Calculation (min) (ACUTE ONLY): 60 min  Past Medical History:  Past Medical History:  Diagnosis Date  . Afib ([place])   . Aortic atherosclerosis ([place])   . Arthritis   . B12 deficiency   . Bilateral carotid artery stenosis   . Cancer [facility])    prostate 2001  . Cardiac defibrillator in situ   . CHF (congestive heart failure) ([place])   . Chronic gouty arthritis   . Diabetes mellitus without complication ([place])   . Diabetic sensorimotor neuropathy ([place])   . Emphysema lung ([place])   . GERD (gastroesophageal reflux disease)   . History of cardioembolic cerebrovascular accident (CVA)   . History of DVT (deep vein thrombosis)   . History of kidney stones    H/O  . History of prostatectomy   . Hyperlipidemia   . LBBB (left bundle branch block)   . NICM (nonischemic cardiomyopathy) [facility])    Past Surgical History:  Past Surgical History:  Procedure Laterality Date  . CARDIAC DEFIBRILLATOR PLACEMENT    . DIRECT LARYNGOSCOPY N/A 01/31/2020   Procedure: MICRO DIRECT LARYNGOSCOPYWITH BIOPSY;  Surgeon: Clyde Canterbury, MD;  Location: [facility] ORS;  Service: ENT;  Laterality: N/A;  . eyelid surgery    . KNEE ARTHROSCOPY Bilateral   . PROSTATECTOMY     HPI:  Pt is a 87 y.o. 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  Patient states that he started having shortness of breath and cough since morning of admit, which has been progressively worsening. Patient  has subjective fever and chills. CP.  No nausea, vomiting, diarrhea, abdominal, symptoms of UTI.  Patient is currently doing radiation therapy for laryngeal cancer.  Last treatment was on Friday.  Upon admit to the ED, pt was found to have pancytopenia.  CXR: Interstitial and patchy airspace disease in the right upper lobe suggests pneumonia.  Pt presents w/ Dypsphonia(able to whisper primarily w/ reduced breath support and poor vocal cord contact).  Pt and NSG denied any swallowing problems currently.   Assessment / Plan / Recommendation Clinical Impression  Pt appears to present w/ grossly adequate oropharyngeal phase swallow w/ no consistent clinical s/s of oropharyngeal phase dysphagia noted during trials, No neuromuscular deficits noted. Pt consumed po trials w/ no immediate, overt clinical s/s of aspiration during po trials, however, a mild, delayed throat clearing noted x2 post swallows. No decline in respiratory presentation during/after noted. Pt appears to reduce risk for aspiration when following general aspiration precautions including No Straw and using a puree when swallowing Pills. 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. Pt endorses odynophagia intermittently; suspect d/t the discomfort of effects from Radiation tx on pharyngeal-laryngeal mucous membranes. Pt endorses he has felt a "sore throat" but is not immediately bothered by it during this session today. He is currently on Peridex, oral rinse. No overt lesions apparent per NSG. After positioning support and education on general aspiration precautions, pt consumed po trials of thin and Nectar liquids, and puree consistencies  w/ no immediate, overt coughing, decline in vocal quality from his baseline, or change in respiratory presentation during/post trials. Mild throat clearing occurred inconsistently x2 post thin liquids w/ no O2 desats or change in RR/effort following.  Oral phase appeared [facility] w/ timely bolus management and control of bolus propulsion for timely A-P transfer for swallowing. Oral clearing achieved w/ all trial consistencies. No unilateral oral weakness noted during bolus management. Speech Clear, low volume. Dentition. Pt fed self w/ setup support.   Pt has not provided a history of an aspiration event, but at risk given Radiation induced mucositis w/ irritation/change of tissue possibly impacting Timing of the pharyngeal swallow. Thoroughly discussed oral care including warm salt water rinses, recommendations for diet and food consistencies as well as preparation, and NOT using chloraseptic numbing spray BEFORE eating/drinking d/t risk for aspiration. Discussed swallowing and general aspiration. Encouraged pt/family to find foods of choice and alter/match the consistency to what the pt can easily swallowing w/ comfort. Recommend a more MINCED meat Regular diet for options, moistened foods w/ gravies and Soups added; Thin liquids via CUP -- NO STRAWS. Recommend general aspiration precautions, Pills WHOLE in Puree for safer, easier swallowing. Education given; Handouts given. Pt/family to monitor if any increased Dysphagia and/or Throat clearing occurs during oral intake, drinking of liquids = encouraged monitoring of any negative sequelae from aspiration. This may warrant f/u w/ objective swallow assessment(MBSS). Pt/family and NSG agreed. MD updated. SLP Visit Diagnosis: Dysphagia, pharyngeal phase (R13.13) (suspicious)    Aspiration Risk  Mild aspiration risk;Risk for inadequate nutrition/hydration (d/t tx for laryngeal Ca ongoing)    Diet Recommendation  Regular diet w/ Meats MINCED w/ gravies to moisten, foods cut well and moist; Thin liquids VIA CUP. Aspiration precautions.  Medication Administration: Whole meds with puree (for ease of swallowing)    Other  Recommendations Recommended Consults:  (Dietician f/u) Oral Care Recommendations: Oral care  BID;Oral care before and after PO;Patient independent with oral care Other Recommendations:  (n/a)   Follow up Recommendations Home health SLP (TBD)      Frequency and Duration min 2x/week  1 week       Prognosis Prognosis for Safe Diet Advancement: Fair Barriers to Reach Goals: Time post onset;Severity of deficits (Laryngeal Ca)      Swallow Study   General Date of Onset: 03/11/20 HPI: Pt is a 87 y.o. 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  Patient states that he started having shortness of breath and cough since morning of admit, which has been progressively worsening. Patient has subjective fever and chills. CP.  No nausea, vomiting, diarrhea, abdominal, symptoms of UTI.  Patient is currently doing radiation therapy for laryngeal cancer.  Last treatment was on Friday.  Upon admit to the ED, pt was found to have pancytopenia.  CXR: Interstitial and patchy airspace disease in the right upper lobe suggests pneumonia.  Pt presents w/ Dypsphonia(able to whisper primarily w/ reduced breath support and poor vocal cord contact).  Pt and NSG denied any swallowing problems currently. Type of Study: Bedside Swallow Evaluation Previous Swallow Assessment: none Diet Prior to this Study: Regular;Thin liquids Temperature Spikes Noted: No (wbc 0.7) Respiratory Status: Nasal cannula (2L) History of Recent Intubation: No Behavior/Cognition: Alert;Cooperative;Pleasant mood Oral  Cavity Assessment:  (no overt oral lesions; sore throat reported) Oral Care Completed by SLP: Recent completion by staff Oral Cavity - Dentition: Adequate natural dentition Vision: Functional  for self-feeding Self-Feeding Abilities: Able to feed self;Needs assist;Needs set up (shaky UEs) Patient Positioning: Upright in bed (supported more upright in his positioning) Baseline Vocal Quality: Hoarse;Breathy;Low vocal intensity (Mod-Severe Dysphonia) Volitional Cough: Weak Volitional Swallow: Able to elicit    Oral/Motor/Sensory Function Overall Oral Motor/Sensory Function: Within functional limits   Ice Chips Ice chips: Not tested   Thin Liquid Thin Liquid: Impaired Presentation: Cup;Self Fed (~4+ ozs) Oral Phase Impairments:  (none) Pharyngeal  Phase Impairments: Throat Clearing - Delayed (x2) Other Comments: inconsistent post swallow    Nectar Thick Nectar Thick Liquid: Within functional limits Presentation: Cup;Self Fed (5-6 trials) Other Comments: cream soup via cup   Honey Thick Honey Thick Liquid: Not tested   Puree Puree: Within functional limits Presentation: Self Fed;Spoon (10+ boluses)   Solid     Solid: Not tested Other Comments: declined        Orinda Kenner, MS, [place] [facility] [phone] [facility] 03/12/2020,3:21 PM

## 2020-03-13 ENCOUNTER — Inpatient Hospital Stay: Payer: Medicare HMO

## 2020-03-13 ENCOUNTER — Ambulatory Visit: Payer: Medicare HMO

## 2020-03-13 DIAGNOSIS — N179 Acute kidney failure, unspecified: Secondary | ICD-10-CM

## 2020-03-13 DIAGNOSIS — C329 Malignant neoplasm of larynx, unspecified: Secondary | ICD-10-CM | POA: Diagnosis not present

## 2020-03-13 DIAGNOSIS — A419 Sepsis, unspecified organism: Secondary | ICD-10-CM | POA: Diagnosis not present

## 2020-03-13 DIAGNOSIS — E785 Hyperlipidemia, unspecified: Secondary | ICD-10-CM

## 2020-03-13 DIAGNOSIS — E1169 Type 2 diabetes mellitus with other specified complication: Secondary | ICD-10-CM | POA: Diagnosis not present

## 2020-03-13 DIAGNOSIS — J9601 Acute respiratory failure with hypoxia: Secondary | ICD-10-CM | POA: Diagnosis not present

## 2020-03-13 DIAGNOSIS — R6521 Severe sepsis with septic shock: Secondary | ICD-10-CM

## 2020-03-13 DIAGNOSIS — I5022 Chronic systolic (congestive) heart failure: Secondary | ICD-10-CM

## 2020-03-13 DIAGNOSIS — L8990 Pressure ulcer of unspecified site, unspecified stage: Secondary | ICD-10-CM | POA: Insufficient documentation

## 2020-03-13 DIAGNOSIS — E538 Deficiency of other specified B group vitamins: Secondary | ICD-10-CM | POA: Diagnosis not present

## 2020-03-13 DIAGNOSIS — N1831 Chronic kidney disease, stage 3a: Secondary | ICD-10-CM

## 2020-03-13 DIAGNOSIS — I482 Chronic atrial fibrillation, unspecified: Secondary | ICD-10-CM

## 2020-03-13 DIAGNOSIS — D61818 Other pancytopenia: Secondary | ICD-10-CM | POA: Diagnosis not present

## 2020-03-13 LAB — BASIC METABOLIC PANEL
Anion gap: 4 — ABNORMAL LOW (ref 5–15)
BUN: 23 mg/dL (ref 8–23)
CO2: 25 mmol/L (ref 22–32)
Calcium: 7.4 mg/dL — ABNORMAL LOW (ref 8.9–10.3)
Chloride: 104 mmol/L (ref 98–111)
Creatinine, Ser: 1.32 mg/dL — ABNORMAL HIGH (ref 0.61–1.24)
GFR, Estimated: 51 mL/min — ABNORMAL LOW (ref >60)
Glucose, Bld: 153 mg/dL — ABNORMAL HIGH (ref 70–99)
Potassium: 3.6 mmol/L (ref 3.5–5.1)
Sodium: 133 mmol/L — ABNORMAL LOW (ref 135–145)

## 2020-03-13 LAB — GLUCOSE, CAPILLARY
Glucose-Capillary: 142 mg/dL — ABNORMAL HIGH (ref 70–99)
Glucose-Capillary: 155 mg/dL — ABNORMAL HIGH (ref 70–99)
Glucose-Capillary: 179 mg/dL — ABNORMAL HIGH (ref 70–99)
Glucose-Capillary: 196 mg/dL — ABNORMAL HIGH (ref 70–99)

## 2020-03-13 LAB — LEGIONELLA PNEUMOPHILA SEROGP 1 UR AG: L. pneumophila Serogp 1 Ur Ag: NEGATIVE

## 2020-03-13 MED ORDER — JUVEN PO PACK
1.0000 | PACK | Freq: Two times a day (BID) | ORAL | Status: DC
  Administered 2020-03-13 – 2020-03-14 (×3): 1 via ORAL

## 2020-03-13 MED ORDER — IPRATROPIUM-ALBUTEROL 0.5-2.5 (3) MG/3ML IN SOLN
3.0000 mL | Freq: Four times a day (QID) | RESPIRATORY_TRACT | Status: DC
  Administered 2020-03-13 – 2020-03-14 (×6): 3 mL via RESPIRATORY_TRACT
  Filled 2020-03-13 (×5): qty 3

## 2020-03-13 MED ORDER — ENSURE ENLIVE PO LIQD
237.0000 mL | Freq: Two times a day (BID) | ORAL | Status: DC
  Administered 2020-03-13 – 2020-03-14 (×4): 237 mL via ORAL

## 2020-03-13 NOTE — Progress Notes (Signed)
Initial Nutrition Assessment  DOCUMENTATION CODES:   Not applicable  INTERVENTION:  Ensure Enlive po BID, each supplement provides 350 kcal and 20 grams of protein (strawberry)  Juven BID, each packet provides 95 calories, 2.5 grams of protein (collagen), and 9.8 grams of carbohydrate (3 grams sugar); also contains 7 grams of L-arginine and L-glutamine, 300 mg vitamin C, 15 mg vitamin E, 1.2 mcg vitamin B-12, 9.5 mg zinc, 200 mg calcium, and 1.5 g  Calcium Beta-hydroxy-Beta-methylbutyrate to support wound healing   Education with handout provided  NUTRITION DIAGNOSIS:   Increased nutrient needs related to cancer and cancer related treatments,wound healing (laryngeal cancer undergoing radiation therapy; stage II coccyx) as evidenced by estimated needs.    GOAL:   Patient will meet greater than or equal to 90% of their needs    MONITOR:   PO intake,Supplement acceptance,Weight trends,Labs,I & O's,Skin  REASON FOR ASSESSMENT:   Consult Assessment of nutrition requirement/status  ASSESSMENT:  87 year old male admitted with acute respiratory failure with hypoxia. Past medical history significant of laryngeal cancer on radiation therapy, HTN, HLD, DM2, COPD, stroke, GERD, gout, left bundle blockage, DVT not on anticoagulants, sCHF with EF 40%, atrial fibrillation on anticoagulants, bilateral carotid artery stenosis, kidney stone, cardiac defibrillator in situ presented with progressive shortness of breath and cough.  Pt awake sitting up in bed, his 2 sons present at bedside. Pt reports doing well, endorses good appetite and recalls eating an omelette for breakfast. Per flowsheet, he consumed 20% x 1 documented meal on 3/01. Pt reports he is eating well at home, denies swallowing difficulties, decreased appetite or changes in weight. Per chart, weights have decreased ~ 6 lbs (3%) in the last 5 weeks which is insignificant for time frame, however concerning given advanced age, chronic  comorbidities, and current radiation therapy for laryngeal cancer. RD educated on increased needs secondary to cancer and related treatments and the importance of nutrition. Discussed nutrition impact symptoms and strategies to improve oral intake, "Dry Mouth or Thick Saliva" handout from [employer] provided today. Pt appreciative and is agreeable to drinking strawberry Ensure to help him meet his needs. Will order twice daily as well as Juven to support wound healing of stage II pressure injury present on admission.  I/Os: +500 ml since admit UOP: 1025 ml x 24 hrs  Medications reviewed and include: Pepcid, SSI, IV Zosyn  Labs: CBGs 142,131,183,225, Cr 1.32 (H), WBC 1.0 (L), RBC 1.67 (L), Hgb 6.5 (H), HCT 19.2 (L)  NUTRITION - FOCUSED PHYSICAL EXAM:  Flowsheet Row Most Recent Value  Orbital Region Moderate depletion  Upper Arm Region Mild depletion  Thoracic and Lumbar Region No depletion  Buccal Region Mild depletion  Temple Region Mild depletion  Clavicle Bone Region Mild depletion  Clavicle and Acromion Bone Region No depletion  Scapular Bone Region Unable to assess  Dorsal Hand No depletion  Patellar Region Unable to assess  Anterior Thigh Region Unable to assess  Posterior Calf Region Unable to assess  Edema (RD Assessment) Moderate  [BLE]  Hair Reviewed  Eyes Reviewed  [redness around eyelid]  Mouth Reviewed  Skin Reviewed  Nails Reviewed       Diet Order:   Diet Order            Diet regular Room service appropriate? Yes with Assist; Fluid consistency: Thin  Diet effective now                 EDUCATION NEEDS:  Education needs have been addressed  Skin:  Skin Assessment: Skin Integrity Issues: Skin Integrity Issues:: Stage II Stage II: coccyx  Last BM:  3/01  Height:   Ht Readings from Last 1 Encounters:  03/11/20 5\' 10"  (1.778 m)    Weight:   Wt Readings from Last 1 Encounters:  03/11/20 82.7 kg    BMI:  Body mass index  is 26.16 kg/m.  Estimated Nutritional Needs:   Kcal:  2200-2400  Protein:  120-135  Fluid:  >2 L    Thomas Morton, RD, LDN Clinical Nutrition After Hours/Weekend Pager # in [place]

## 2020-03-13 NOTE — Progress Notes (Signed)
Patient ID: Thomas Morton, male   DOB: 1927-07-09, 87 y.o.   MRN: [id number] [facility] PROGRESS NOTE  Thomas Morton PJA:[id number] DOB: [birth year] DOA: 03/11/2020 PCP: Rusty Aus, MD  HPI/Subjective: Patient feeling okay.  He declined blood transfusion today.  Has had a hoarse voice for few months.  No shortness of breath or chest pain.  Patient admitted with severe sepsis with septic shock and was initially in the ICU.Marland Kitchen  Objective: Vitals:   03/13/20 1207 03/13/20 1544  BP: (!) 97/48 (!) 112/59  Pulse: 74 88  Resp: 18 18  Temp: 98.4 F (36.9 C) 98.6 F (37 C)  SpO2: 99% 98%    Intake/Output Summary (Last 24 hours) at 03/13/2020 1656 Last data filed at 03/13/2020 1637 Gross per 24 hour  Intake 507.62 ml  Output 1125 ml  Net -617.38 ml   Filed Weights   03/11/20 0951 03/11/20 2040  Weight: 81.2 kg 82.7 kg    ROS: Review of Systems  Respiratory: Negative for cough and shortness of breath.   Cardiovascular: Negative for chest pain.  Gastrointestinal: Negative for abdominal pain, nausea and vomiting.   Exam: Physical Exam HENT:     Head: Normocephalic.     Mouth/Throat:     Pharynx: No oropharyngeal exudate.  Eyes:     General: Lids are normal.     Conjunctiva/sclera: Conjunctivae normal.     Pupils: Pupils are equal, round, and reactive to light.  Cardiovascular:     Rate and Rhythm: Normal rate and regular rhythm.     Heart sounds: Normal heart sounds, S1 normal and S2 normal.  Pulmonary:     Breath sounds: Normal breath sounds. No decreased breath sounds, wheezing, rhonchi or rales.  Abdominal:     Palpations: Abdomen is soft.     Tenderness: There is no abdominal tenderness.  Musculoskeletal:     Right ankle: Swelling present.     Left ankle: Swelling present.  Skin:    General: Skin is warm.     Findings: No rash.  Neurological:     Mental Status: He is alert and oriented to person, place, and time.       Data Reviewed: Basic Metabolic  Panel: Recent Labs  Lab 03/11/20 1024 03/12/20 0523 03/13/20 0526  NA 136 137 133*  K 4.5 3.6 3.6  CL 101 109 104  CO2 23 23 25   GLUCOSE 263* 176* 153*  BUN 28* 25* 23  CREATININE 2.03* 1.50* 1.32*  CALCIUM 8.7* 7.3* 7.4*  MG  --  1.2*  --   PHOS  --  2.4*  --    Liver Function Tests: Recent Labs  Lab 03/11/20 1024 03/12/20 0523  AST 22 20  ALT 10 12  ALKPHOS 62 40  BILITOT 1.1 0.7  PROT 6.7 5.3*  ALBUMIN 3.4* 2.5*   CBC: Recent Labs  Lab 03/11/20 1024 03/12/20 0523 03/13/20 0526  WBC 1.1* 0.7* 1.0*  NEUTROABS 0.3* 0.1* 0.2*  HGB 9.0* 7.1* 6.5*  HCT 26.7* 20.5* 19.2*  MCV 113.6* 114.5* 115.0*  PLT 75* 54* 48*   BNP (last 3 results) Recent Labs    03/11/20 1024  BNP 119.2*     CBG: Recent Labs  Lab 03/12/20 1613 03/12/20 1954 03/13/20 0742 03/13/20 1208 03/13/20 1628  GLUCAP 183* 131* 142* 155* 179*    Recent Results (from the past 240 hour(s))  Resp Panel by RT-PCR (Flu A&B, Covid) Nasopharyngeal Swab     Status: None   Collection  Time: 03/11/20 10:24 AM   Specimen: Nasopharyngeal Swab; Nasopharyngeal(NP) swabs in vial transport medium  Result Value Ref Range Status   SARS Coronavirus 2 by RT PCR NEGATIVE NEGATIVE Final    Comment: (NOTE) SARS-CoV-2 target nucleic acids are NOT DETECTED.  The SARS-CoV-2 RNA is generally detectable in upper respiratory specimens during the acute phase of infection. The lowest concentration of SARS-CoV-2 viral copies this assay can detect is 138 copies/mL. A negative result does not preclude SARS-Cov-2 infection and should not be used as the sole basis for treatment or other patient management decisions. A negative result may occur with  improper specimen collection/handling, submission of specimen other than nasopharyngeal swab, presence of viral mutation(s) within the areas targeted by this assay, and inadequate number of viral copies(<138 copies/mL). A negative result must be combined with clinical  observations, patient history, and epidemiological information. The expected result is Negative.  Fact Sheet for Patients:  EntrepreneurPulse.com.au  Fact Sheet for Healthcare Providers:  IncredibleEmployment.be  This test is no t yet approved or cleared by the [place] FDA and  has been authorized for detection and/or diagnosis of SARS-CoV-2 by FDA under an Emergency Use Authorization (EUA). This EUA will remain  in effect (meaning this test can be used) for the duration of the COVID-19 declaration under Section 564(b)(1) of the Act, 21 U.S.C.section 360bbb-3(b)(1), unless the authorization is terminated  or revoked sooner.       Influenza A by PCR NEGATIVE NEGATIVE Final   Influenza B by PCR NEGATIVE NEGATIVE Final    Comment: (NOTE) The Xpert Xpress SARS-CoV-2/FLU/RSV plus assay is intended as an aid in the diagnosis of influenza from Nasopharyngeal swab specimens and should not be used as a sole basis for treatment. Nasal washings and aspirates are unacceptable for Xpert Xpress SARS-CoV-2/FLU/RSV testing.  Fact Sheet for Patients: EntrepreneurPulse.com.au  Fact Sheet for Healthcare Providers: IncredibleEmployment.be  This test is not yet approved or cleared by the [place] FDA and has been authorized for detection and/or diagnosis of SARS-CoV-2 by FDA under an Emergency Use Authorization (EUA). This EUA will remain in effect (meaning this test can be used) for the duration of the COVID-19 declaration under Section 564(b)(1) of the Act, 21 U.S.C. section 360bbb-3(b)(1), unless the authorization is terminated or revoked.  Performed at [facility], [address] [place], [place] [address]   Culture, blood (routine x 2)     Status: None (Preliminary result)   Collection Time: 03/11/20 10:25 AM   Specimen: BLOOD  Result Value Ref Range Status   Specimen Description BLOOD BLOOD  LEFT WRIST  Final   Special Requests   Final    BOTTLES DRAWN AEROBIC AND ANAEROBIC Blood Culture adequate volume   Culture   Final    NO GROWTH 2 DAYS Performed at [facility], [address], [place], [place] [address]    Report Status PENDING  Incomplete  Culture, blood (routine x 2)     Status: None (Preliminary result)   Collection Time: 03/11/20 10:25 AM   Specimen: BLOOD  Result Value Ref Range Status   Specimen Description BLOOD LEFT ANTECUBITAL  Final   Special Requests   Final    BOTTLES DRAWN AEROBIC AND ANAEROBIC Blood Culture adequate volume   Culture  Setup Time   Final    Organism ID to follow GRAM POSITIVE COCCI AEROBIC BOTTLE ONLY CRITICAL RESULT CALLED TO, READ BACK BY AND VERIFIED WITH: SUSAN WATSON AT 1003 03/12/20 [place] Performed at [facility]  Lab, [place], [place] [address]    Culture GRAM POSITIVE COCCI  Final   Report Status PENDING  Incomplete  Blood Culture ID Panel (Reflexed)     Status: Abnormal   Collection Time: 03/11/20 10:25 AM  Result Value Ref Range Status   Enterococcus faecalis NOT DETECTED NOT DETECTED Final   Enterococcus Faecium NOT DETECTED NOT DETECTED Final   Listeria monocytogenes NOT DETECTED NOT DETECTED Final   Staphylococcus species DETECTED (A) NOT DETECTED Final    Comment: CRITICAL RESULT CALLED TO, READ BACK BY AND VERIFIED WITH:  SUSAN WATSON AT 1003 03/12/20 SDR    Staphylococcus aureus (BCID) NOT DETECTED NOT DETECTED Final   Staphylococcus epidermidis NOT DETECTED NOT DETECTED Final   Staphylococcus lugdunensis NOT DETECTED NOT DETECTED Final   Streptococcus species NOT DETECTED NOT DETECTED Final   Streptococcus agalactiae NOT DETECTED NOT DETECTED Final   Streptococcus pneumoniae NOT DETECTED NOT DETECTED Final   Streptococcus pyogenes NOT DETECTED NOT DETECTED Final   A.calcoaceticus-baumannii NOT DETECTED NOT DETECTED Final   Bacteroides fragilis NOT DETECTED NOT DETECTED Final    Enterobacterales NOT DETECTED NOT DETECTED Final   Enterobacter cloacae complex NOT DETECTED NOT DETECTED Final   Escherichia coli NOT DETECTED NOT DETECTED Final   Klebsiella aerogenes NOT DETECTED NOT DETECTED Final   Klebsiella oxytoca NOT DETECTED NOT DETECTED Final   Klebsiella pneumoniae NOT DETECTED NOT DETECTED Final   Proteus species NOT DETECTED NOT DETECTED Final   Salmonella species NOT DETECTED NOT DETECTED Final   Serratia marcescens NOT DETECTED NOT DETECTED Final   Haemophilus influenzae NOT DETECTED NOT DETECTED Final   Neisseria meningitidis NOT DETECTED NOT DETECTED Final   Pseudomonas aeruginosa NOT DETECTED NOT DETECTED Final   Stenotrophomonas maltophilia NOT DETECTED NOT DETECTED Final   Candida albicans NOT DETECTED NOT DETECTED Final   Candida auris NOT DETECTED NOT DETECTED Final   Candida glabrata NOT DETECTED NOT DETECTED Final   Candida krusei NOT DETECTED NOT DETECTED Final   Candida parapsilosis NOT DETECTED NOT DETECTED Final   Candida tropicalis NOT DETECTED NOT DETECTED Final   Cryptococcus neoformans/gattii NOT DETECTED NOT DETECTED Final    Comment: Performed at [facility], [address] [place], [place] [address]  MRSA PCR Screening     Status: None   Collection Time: 03/11/20  5:25 PM   Specimen: Nasal Mucosa; Nasopharyngeal  Result Value Ref Range Status   MRSA by PCR NEGATIVE NEGATIVE Final    Comment:        The GeneXpert MRSA Assay (FDA approved for NASAL specimens only), is one component of a comprehensive MRSA colonization surveillance program. It is not intended to diagnose MRSA infection nor to guide or monitor treatment for MRSA infections. Performed at [facility], [address] [place], [place] [address]       Scheduled Meds: . allopurinol  300 mg Oral QHS  . aspirin  325 mg Oral Daily  . atorvastatin  40 mg Oral BH-q7a  . chlorhexidine  15 mL Mouth Rinse BID  . Chlorhexidine Gluconate Cloth  6  each Topical Daily  . famotidine  20 mg Oral QHS  . feeding supplement  237 mL Oral BID BM  . insulin aspart  0-5 Units Subcutaneous QHS  . insulin aspart  0-9 Units Subcutaneous TID WC  . ipratropium-albuterol  3 mL Nebulization Q6H  . mouth rinse  15 mL Mouth Rinse q12n4p  . midodrine  5 mg Oral TID WC  .  nutrition supplement (JUVEN)  1 packet Oral BID BM   Continuous Infusions: . sodium chloride 250 mL (03/11/20 2200)  . piperacillin-tazobactam (ZOSYN)  IV 3.375 g (03/13/20 1358)    Assessment/Plan:  1. Severe sepsis with septic shock secondary to right upper lobe pneumonia.  Initially was on Levophed.  Blood culture shows staph species 1 out of 4 bottles likely a contaminant.  Respiratory panel positive for rhinovirus.  Patient currently on IV Zosyn.  Patient on midodrine. 2. Acute kidney injury on chronic kidney disease stage IIIa.  Initial creatinine 2.03 and is now down to 1.32 today. 3. Pancytopenia.  Several blasts seen on the peripheral smear with a little large cells with lymphoid appearance.  Hematology consult appreciated.  Today's hemoglobin 6.5.  Patient refused blood transfusion.  Today's platelets 48.  Today's white blood cell count 1.0. 4. Type 2 diabetes mellitus with hyperlipidemia unspecified on atorvastatin.  On sliding scale.  Hemoglobin A1c 6.5. 5. 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 6. Chronic systolic congestive heart failure no signs of heart failure currently. 7. History of gout on allopurinol 8. Chronic atrial fibrillation and history of CVA on aspirin for anticoagulation and Lipitor. 9. Stage II coccyx decubiti present on admission.  See description below  Pressure Injury 03/12/20 Coccyx Mid Stage 2 -  Partial thickness loss of dermis presenting as a shallow open injury with a red, pink wound bed without slough. white (Active)  03/12/20 1844  Location: Coccyx  Location Orientation: Mid  Staging: Stage 2 -  Partial thickness  loss of dermis presenting as a shallow open injury with a red, pink wound bed without slough.  Wound Description (Comments): white  Present on Admission: Yes       Code Status:     Code Status Orders  (From admission, onward)         Start     Ordered   03/12/20 0719  Do not attempt resuscitation (DNR)  Continuous       Question Answer Comment  In the event of cardiac or respiratory ARREST Do not call a "code blue"   In the event of cardiac or respiratory ARREST Do not perform Intubation, CPR, defibrillation or ACLS   In the event of cardiac or respiratory ARREST Use medication by any route, position, wound care, and other measures to relive pain and suffering. May use oxygen, suction and manual treatment of airway obstruction as needed for comfort.      03/12/20 0718        Code Status History    Date Active Date Inactive Code Status Order ID Comments User Context   03/11/2020 2206 03/12/2020 0718 Partial Code [id number] Vasopressor administration only Rust-Chester, Huel Cote, NP Inpatient   03/11/2020 1926 03/11/2020 2206 Full Code [id number]  Ivor Costa, MD ED   03/11/2020 1301 03/11/2020 1926 Partial Code [id number] No CPR or intubation, but vassopressor OK per his son Ivor Costa, MD ED   03/11/2020 1301 03/11/2020 1301 Partial Code [id number]  Ivor Costa, MD ED   12/22/2017 1620 12/25/2017 2246 Full Code [id number]  Loletha Grayer, MD Inpatient   10/14/2015 1055 10/15/2015 1625 Full Code [id number]  Theodoro Grist, MD Inpatient   Advance Care Planning Activity    Advance Directive Documentation   Flowsheet Row Most Recent Value  Type of Advance Directive Healthcare Power of Attorney, Living will  Pre-existing out of facility DNR order (yellow form or pink MOST form) -  "MOST" Form in Place? -  Family Communication: 2 sons at bedside Disposition Plan: Status is: Inpatient  Dispo: The patient is from: Home              Anticipated d/c is to: Home              Patient currently  not ready for disposition yet.  Patient deferred blood transfusion today.  If hemoglobin lower tomorrow we will have to talk more about blood transfusion.   Difficult to place patient.  No  Time spent: 28 minutes  [place]

## 2020-03-13 NOTE — Evaluation (Addendum)
Physical Therapy Evaluation Patient Details Name: Thomas Morton MRN: [id number] DOB: [birth year] Today's Date: 03/13/2020   History of Present Illness  presented to ER secondary to progressive cough, SOB; admitted for management of acute respiratory failure with hypoxia, severe sepsis related to RUL PNA.  Clinical Impression  Upon evaluation, patient alert and oriented; follows commands and eager for OOB mobility.  Denies pain and endorses noted improvement in respiratory status since admission.  Bilat UE/LE strength and ROM grossly symmetrical and WFL; no focal weakness appreciated.  Able to complete bed mobility with min assist; sit/stand, basic transfers and gait (200') without assist device, cga/min assist.  Demonstrates reciprocal stepping pattern with fair step height/length; fair cadence and overall gait speed.  Mild sway with head turns, but self-corrects without difficulty.  BORG 5/10 after distance, sats >92% on 2L with gait efforts.  Do anticipate need for supplemental O2 upon discharge; will integrate O2 management into subsequent sessions appropriate. Would benefit from skilled PT to address above deficits and promote optimal return to PLOF.; Recommend transition to HHPT upon discharge from acute hospitalization.  Of note, cleared by Dr. Leslye Peer for participation with evaluation/gait this date.  SaO2 on room air at rest = 90% SaO2 on room air while ambulating = 85% SaO2 on 2 liters of O2 while ambulating = 94%     Follow Up Recommendations Home health PT    Equipment Recommendations       Recommendations for Other Services       Precautions / Restrictions Precautions Precautions: Fall Restrictions Weight Bearing Restrictions: No      Mobility  Bed Mobility Overal bed mobility: Needs Assistance Bed Mobility: Supine to Sit     Supine to sit: Min assist          Transfers Overall transfer level: Needs assistance   Transfers: Sit to/from Stand Sit to Stand:  Min guard         General transfer comment: fair/good LE strength and power; mild use of UEs for lift off and stabilization  Ambulation/Gait Ambulation/Gait assistance: Min guard Gait Distance (Feet): 200 Feet Assistive device: None       General Gait Details: reciprocal stepping pattern with fair step height/length; fair cadence and overall gait speed.  Mild sway with head turns, but self-corrects without difficulty.  BORG 5/10 after distance, sats >92% on 2L with gait efforts.  Stairs            Wheelchair Mobility    Modified Rankin (Stroke Patients Only)       Balance Overall balance assessment: Needs assistance Sitting-balance support: No upper extremity supported;Feet supported Sitting balance-Leahy Scale: Good     Standing balance support: Bilateral upper extremity supported Standing balance-Leahy Scale: Good                               Pertinent Vitals/Pain Pain Assessment: No/denies pain    Home Living Family/patient expects to be discharged to:: Private residence Living Arrangements: Spouse/significant other Available Help at Discharge: Family Type of Home: House Home Access: Stairs to enter Entrance Stairs-Rails: Can reach both Entrance Stairs-Number of Steps: 3 from garage Home Layout: Multi-level (split level home) Home Equipment: None      Prior Function Level of Independence: Independent         Comments: Indep with ADLs, household and community mobilization upon discharge; no home O2.  Denies fall history.     Hand Dominance  Extremity/Trunk Assessment   Upper Extremity Assessment Upper Extremity Assessment: Overall WFL for tasks assessed    Lower Extremity Assessment Lower Extremity Assessment: Overall WFL for tasks assessed (grossly at least 4/5 throughout)       Communication   Communication:  (limited phonation due to laryngeal CA)  Cognition Arousal/Alertness: Awake/alert Behavior During  Therapy: WFL for tasks assessed/performed Overall Cognitive Status: Within Functional Limits for tasks assessed                                        General Comments      Exercises Other Exercises Other Exercises: Educated in role of PT and progressive mobility, encouraged OOB for toileting (discontinuation of external catheter); patient voiced understanding. Other Exercises: Sit/stand x3 without assist device, cga/close sup; standing weight shift, LE forward/backward/lateral stepping x5 bilat, cga/min assist.  Mild sway initially, but improves with accommodation to upright position.   Assessment/Plan    PT Assessment Patient needs continued PT services  PT Problem List Decreased activity tolerance;Decreased balance;Decreased mobility;Decreased coordination;Cardiopulmonary status limiting activity;Decreased knowledge of precautions;Decreased safety awareness       PT Treatment Interventions DME instruction;Gait training;Stair training;Functional mobility training;Therapeutic activities;Therapeutic exercise;Balance training;Patient/family education    PT Goals (Current goals can be found in the Care Plan section)  Acute Rehab PT Goals Patient Stated Goal: to return home PT Goal Formulation: With patient/family Time For Goal Achievement: 03/27/20 Potential to Achieve Goals: Good    Frequency Min 2X/week   Barriers to discharge        Co-evaluation               AM-PAC PT "6 Clicks" Mobility  Outcome Measure Help needed turning from your back to your side while in a flat bed without using bedrails?: None Help needed moving from lying on your back to sitting on the side of a flat bed without using bedrails?: A Little Help needed moving to and from a bed to a chair (including a wheelchair)?: A Little Help needed standing up from a chair using your arms (e.g., wheelchair or bedside chair)?: A Little Help needed to walk in hospital room?: A Little Help  needed climbing 3-5 steps with a railing? : A Little 6 Click Score: 19    End of Session Equipment Utilized During Treatment: Gait belt;Oxygen Activity Tolerance: Patient tolerated treatment well Patient left: in bed;with call bell/phone within reach;with bed alarm set Nurse Communication: Mobility status PT Visit Diagnosis: Muscle weakness (generalized) (M62.81);Difficulty in walking, not elsewhere classified (R26.2)    Time: 6283-1517 PT Time Calculation (min) (ACUTE ONLY): 22 min   Charges:   PT Evaluation $PT Eval Moderate Complexity: 1 Mod PT Treatments $Therapeutic Activity: 8-22 mins        Johnesha Acheampong H. Owens Shark, PT, DPT, NCS 03/13/20, 4:30 PM [phone]

## 2020-03-13 NOTE — Progress Notes (Signed)
[facility] Hematology/Oncology Progress Note  Date of admission: 03/11/2020  Hospital day:  03/13/2020  Chief Complaint: Thomas Morton is a 87 y.o. 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  Subjective:  Patient feeling better.  He denies any shortness of breath.  He declines transfusion today.  Social History: The patient is alone today.  Allergies:  Allergies  Allergen Reactions  . Sulfa Antibiotics Rash    Per patient low doses do not cause issues, only higher doses    Scheduled Medications: . allopurinol  300 mg Oral QHS  . aspirin  325 mg Oral Daily  . atorvastatin  40 mg Oral BH-q7a  . chlorhexidine  15 mL Mouth Rinse BID  . Chlorhexidine Gluconate Cloth  6 each Topical Daily  . famotidine  20 mg Oral QHS  . feeding supplement  237 mL Oral BID BM  . insulin aspart  0-5 Units Subcutaneous QHS  . insulin aspart  0-9 Units Subcutaneous TID WC  . ipratropium-albuterol  3 mL Nebulization Q6H  . mouth rinse  15 mL Mouth Rinse q12n4p  . midodrine  5 mg Oral TID WC  . nutrition supplement (JUVEN)  1 packet Oral BID BM    Review of Systems: GENERAL:  Feels better.  No fevers or sweats. PERFORMANCE STATUS (ECOG):  3 HEENT:  No visual changes.  Slight sore throat secondary to radiation. Lungs: No shortness of breath or cough.  No hemoptysis. Cardiac:  No chest pain, palpitations, orthopnea, or PND. GI:  Eating/drinking clear liquids.  No nausea, vomiting, diarrhea, constipation, melena or hematochezia. GU:  No urgency, frequency, dysuria, or hematuria. Musculoskeletal:  No back pain.  No joint pain.  No muscle tenderness. Extremities:  No pain or swelling. Skin:  No rashes or skin changes. Neuro:  No headache, focal numbness or weakness. Endocrine:  Diabetes.  No thyroid issues or night sweats. Psych:  No mood changes, depression or anxiety. Pain:  No focal  pain. Review of systems:  All other systems reviewed and found to be negative.  Physical Exam: Blood pressure 133/63, pulse 91, temperature 97.7 F (36.5 C), resp. rate 20, height 5\' 10"  (1.778 m), weight 182 lb 5.1 oz (82.7 kg), SpO2 100 %.  GENERAL:  Elderly gentleman sitting comfortably on the medical unit in no acute distress. MENTAL STATUS:  Alert and oriented to person, place and time. HEAD:  Pearline Cables hair.  Normocephalic, atraumatic, face symmetric, no Cushingoid features. EYES:  Pupils equal round and reactive to light and accomodation.  No conjunctivitis or scleral icterus. ENT:  Hoarse voice.  Oropharynx clear without lesion.  Tongue normal. Mucous membranes moist.  RESPIRATORY:  Clear to auscultation without rales, wheezes or rhonchi. CARDIOVASCULAR:  Regular rate and rhythm without murmur, rub or gallop. ABDOMEN:  Soft, non-tender, with active bowel sounds, and no hepatosplenomegaly.  No masses. SKIN:  No rashes, ulcers or lesions. EXTREMITIES: No edema, no skin discoloration or tenderness.  No palpable cords. NEUROLOGICAL: Unremarkable. PSYCH:  Appropriate.   Results for orders placed or performed during the hospital encounter of 03/11/20 (from the past 48 hour(s))  Strep pneumoniae urinary antigen     Status: None   Collection Time: 03/11/20  9:55 PM  Result Value Ref Range   Strep Pneumo Urinary Antigen NEGATIVE NEGATIVE    Comment:        Infection due to S. pneumoniae cannot be absolutely ruled out  since the antigen present may be below the detection limit of the test. Performed at [facility], [place] [address] [place], [place] [address]   Legionella Pneumophila Serogp 1 Ur Ag     Status: None   Collection Time: 03/11/20  9:55 PM  Result Value Ref Range   L. pneumophila Serogp 1 Ur Ag Negative Negative    Comment: (NOTE) Presumptive negative for L. pneumophila serogroup 1 antigen in urine, suggesting no recent or current infection. Legionnaires'  disease cannot be ruled out since other serogroups and species may also cause disease. Performed At: [facility] [place], [place] [id number] Rush Farmer MD [id number]    Source of Sample URINE, RANDOM     Comment: Performed at [facility], [address] [place], [place] [address]  Urinalysis, Complete w Microscopic     Status: Abnormal   Collection Time: 03/11/20  9:55 PM  Result Value Ref Range   Color, Urine YELLOW (A) YELLOW   APPearance HAZY (A) CLEAR   Specific Gravity, Urine 1.028 1.005 - 1.030   pH 5.0 5.0 - 8.0   Glucose, UA 50 (A) NEGATIVE mg/dL   Hgb urine dipstick NEGATIVE NEGATIVE   Bilirubin Urine NEGATIVE NEGATIVE   Ketones, ur 5 (A) NEGATIVE mg/dL   Protein, ur 30 (A) NEGATIVE mg/dL   Nitrite NEGATIVE NEGATIVE   Leukocytes,Ua NEGATIVE NEGATIVE   RBC / HPF 0-5 0 - 5 RBC/hpf   WBC, UA 0-5 0 - 5 WBC/hpf   Bacteria, UA NONE SEEN NONE SEEN   Squamous Epithelial / LPF 0-5 0 - 5   Mucus PRESENT     Comment: Performed at [facility], [address] [place], [place] [address]  Hemoglobin A1c     Status: Abnormal   Collection Time: 03/12/20  5:23 AM  Result Value Ref Range   Hgb A1c MFr Bld 6.5 (H) 4.8 - 5.6 %    Comment: (NOTE) Pre diabetes:          5.7%-6.4%  Diabetes:              >6.4%  Glycemic control for   <7.0% adults with diabetes    Mean Plasma Glucose 139.85 mg/dL    Comment: Performed at [facility] [address], [place], [place] [address]  Lipid panel     Status: Abnormal   Collection Time: 03/12/20  5:23 AM  Result Value Ref Range   Cholesterol 69 0 - 200 mg/dL   Triglycerides 42 <150 mg/dL   HDL 32 (L) >40 mg/dL   Total CHOL/HDL Ratio 2.2 RATIO   VLDL 8 0 - 40 mg/dL   LDL Cholesterol 29 0 - 99 mg/dL    Comment:        Total Cholesterol/HDL:CHD Risk Coronary Heart Disease Risk Table                     Men   Women  1/2 Average Risk   3.4   3.3  Average Risk       5.0   4.4  2  X Average Risk   9.6   7.1  3 X Average Risk  23.4   11.0        Use the calculated Patient Ratio above and the CHD Risk Table to determine the patient's CHD Risk.        ATP III CLASSIFICATION (LDL):  <100     mg/dL   Optimal  100-129  mg/dL   Near or Above                    Optimal  130-159  mg/dL   Borderline  160-189  mg/dL   High  >190     mg/dL   Very High Performed at [facility], [address] [place], [place] [address]   Uric acid     Status: Abnormal   Collection Time: 03/12/20  5:23 AM  Result Value Ref Range   Uric Acid, Serum 3.5 (L) 3.7 - 8.6 mg/dL    Comment: Performed at [facility], [address] [place], [place] [address]  Folate, serum, performed at [facility]     Status: None   Collection Time: 03/12/20  5:23 AM  Result Value Ref Range   Folate 9.9 >5.9 ng/mL    Comment: Performed at [facility], [address] [place], [place] [address]  Type and screen [place]     Status: None   Collection Time: 03/12/20  5:23 AM  Result Value Ref Range   ABO/RH(D) B POS    Antibody Screen NEG    Sample Expiration      03/15/2020,2359 Performed at [facility], [address] [place], [place] [address]   Lactic acid, plasma     Status: None   Collection Time: 03/12/20  5:23 AM  Result Value Ref Range   Lactic Acid, Venous 1.1 0.5 - 1.9 mmol/L    Comment: Performed at [facility], [address] [place], [place] [address]  Comprehensive metabolic panel     Status: Abnormal   Collection Time: 03/12/20  5:23 AM  Result Value Ref Range   Sodium 137 135 - 145 mmol/L   Potassium 3.6 3.5 - 5.1 mmol/L   Chloride 109 98 - 111 mmol/L   CO2 23 22 - 32 mmol/L   Glucose, Bld 176 (H) 70 - 99 mg/dL    Comment: Glucose reference range applies only to samples taken after fasting for at least 8 hours.   BUN 25 (H) 8 - 23 mg/dL   Creatinine, Ser 1.50 (H) 0.61 - 1.24 mg/dL   Calcium 7.3 (L)  8.9 - 10.3 mg/dL   Total Protein 5.3 (L) 6.5 - 8.1 g/dL   Albumin 2.5 (L) 3.5 - 5.0 g/dL   AST 20 15 - 41 U/L   ALT 12 0 - 44 U/L   Alkaline Phosphatase 40 38 - 126 U/L   Total Bilirubin 0.7 0.3 - 1.2 mg/dL   GFR, Estimated 44 (L) >60 mL/min    Comment: (NOTE) Calculated using the CKD-EPI Creatinine Equation (2021)    Anion gap 5 5 - 15    Comment: Performed at [facility], [address] [place], [place] [address]  CBC with Differential/Platelet     Status: Abnormal   Collection Time: 03/12/20  5:23 AM  Result Value Ref Range   WBC 0.7 (LL) 4.0 - 10.5 K/uL    Comment: CRITICAL VALUE NOTED.  VALUE IS CONSISTENT WITH PREVIOUSLY REPORTED AND CALLED VALUE.   RBC 1.79 (L) 4.22 - 5.81 MIL/uL   Hemoglobin 7.1 (L) 13.0 - 17.0 g/dL   HCT 20.5 (L) 39.0 - 52.0 %   MCV 114.5 (H) 80.0 - 100.0 fL   MCH 39.7 (H) 26.0 - 34.0 pg   MCHC 34.6 30.0 - 36.0 g/dL   RDW 15.3 11.5 - 15.5 %   Platelets 54 (  L) 150 - 400 K/uL    Comment: Immature Platelet Fraction may be clinically indicated, consider ordering this additional test [id number]    nRBC 0.0 0.0 - 0.2 %   Neutrophils Relative % 17 %   Neutro Abs 0.1 (LL) 1.7 - 7.7 K/uL    Comment: CRITICAL VALUE NOTED.  VALUE IS CONSISTENT WITH PREVIOUSLY REPORTED AND CALLED VALUE.   Lymphocytes Relative 34 %   Lymphs Abs 0.2 (L) 0.7 - 4.0 K/uL   Monocytes Relative 41 %   Monocytes Absolute 0.3 0.1 - 1.0 K/uL   Eosinophils Relative 1 %   Eosinophils Absolute 0.0 0.0 - 0.5 K/uL   Basophils Relative 0 %   Basophils Absolute 0.0 0.0 - 0.1 K/uL   Smear Review Normal platelet morphology    Immature Granulocytes 7 %   Abs Immature Granulocytes 0.05 0.00 - 0.07 K/uL   Ovalocytes PRESENT     Comment: Performed at [facility], [address] [place], [place] [address]  Magnesium     Status: Abnormal   Collection Time: 03/12/20  5:23 AM  Result Value Ref Range   Magnesium 1.2 (L) 1.7 - 2.4 mg/dL    Comment: Performed at [facility], [address] [place], [place] [address]  Phosphorus     Status: Abnormal   Collection Time: 03/12/20  5:23 AM  Result Value Ref Range   Phosphorus 2.4 (L) 2.5 - 4.6 mg/dL    Comment: Performed at [facility], [address] [place], [place] [address]  Glucose, capillary     Status: Abnormal   Collection Time: 03/12/20  7:35 AM  Result Value Ref Range   Glucose-Capillary 159 (H) 70 - 99 mg/dL    Comment: Glucose reference range applies only to samples taken after fasting for at least 8 hours.  Glucose, capillary     Status: Abnormal   Collection Time: 03/12/20 11:38 AM  Result Value Ref Range   Glucose-Capillary 225 (H) 70 - 99 mg/dL    Comment: Glucose reference range applies only to samples taken after fasting for at least 8 hours.  Glucose, capillary     Status: Abnormal   Collection Time: 03/12/20  4:13 PM  Result Value Ref Range   Glucose-Capillary 183 (H) 70 - 99 mg/dL    Comment: Glucose reference range applies only to samples taken after fasting for at least 8 hours.  Glucose, capillary     Status: Abnormal   Collection Time: 03/12/20  7:54 PM  Result Value Ref Range   Glucose-Capillary 131 (H) 70 - 99 mg/dL    Comment: Glucose reference range applies only to samples taken after fasting for at least 8 hours.   Comment 1 Notify RN   CBC with Differential/Platelet     Status: Abnormal   Collection Time: 03/13/20  5:26 AM  Result Value Ref Range   WBC 1.0 (LL) 4.0 - 10.5 K/uL    Comment: CRITICAL VALUE NOTED.  VALUE IS CONSISTENT WITH PREVIOUSLY REPORTED AND CALLED VALUE.   RBC 1.67 (L) 4.22 - 5.81 MIL/uL   Hemoglobin 6.5 (L) 13.0 - 17.0 g/dL   HCT 19.2 (L) 39.0 - 52.0 %   MCV 115.0 (H) 80.0 - 100.0 fL   MCH 38.9 (H) 26.0 - 34.0 pg   MCHC 33.9 30.0 - 36.0 g/dL   RDW 15.4 11.5 - 15.5 %   Platelets 48 (L) 150 - 400 K/uL    Comment: Immature Platelet Fraction may be clinically indicated,  consider ordering this additional  test [id number] CONSISTENT WITH PREVIOUS RESULT    nRBC 0.0 0.0 - 0.2 %   Neutrophils Relative % 17 %   Neutro Abs 0.2 (LL) 1.7 - 7.7 K/uL    Comment: REPEATED TO VERIFY   Lymphocytes Relative 39 %   Lymphs Abs 0.4 (L) 0.7 - 4.0 K/uL   Monocytes Relative 35 %   Monocytes Absolute 0.4 0.1 - 1.0 K/uL   Eosinophils Relative 2 %   Eosinophils Absolute 0.0 0.0 - 0.5 K/uL   Basophils Relative 0 %   Basophils Absolute 0.0 0.0 - 0.1 K/uL   WBC Morphology HIDE    RBC Morphology MORPHOLOGY UNREMARKABLE    Smear Review PLATELETS APPEAR DECREASED    Immature Granulocytes 7 %   Abs Immature Granulocytes 0.07 0.00 - 0.07 K/uL    Comment: Performed at [facility], [address] [place], [place] [address]  Basic metabolic panel     Status: Abnormal   Collection Time: 03/13/20  5:26 AM  Result Value Ref Range   Sodium 133 (L) 135 - 145 mmol/L   Potassium 3.6 3.5 - 5.1 mmol/L   Chloride 104 98 - 111 mmol/L   CO2 25 22 - 32 mmol/L   Glucose, Bld 153 (H) 70 - 99 mg/dL    Comment: Glucose reference range applies only to samples taken after fasting for at least 8 hours.   BUN 23 8 - 23 mg/dL   Creatinine, Ser 1.32 (H) 0.61 - 1.24 mg/dL   Calcium 7.4 (L) 8.9 - 10.3 mg/dL   GFR, Estimated 51 (L) >60 mL/min    Comment: (NOTE) Calculated using the CKD-EPI Creatinine Equation (2021)    Anion gap 4 (L) 5 - 15    Comment: Performed at [facility], [address] [place], [place] [address]  Glucose, capillary     Status: Abnormal   Collection Time: 03/13/20  7:42 AM  Result Value Ref Range   Glucose-Capillary 142 (H) 70 - 99 mg/dL    Comment: Glucose reference range applies only to samples taken after fasting for at least 8 hours.   Comment 1 Notify RN    Comment 2 Document in Chart   Glucose, capillary     Status: Abnormal   Collection Time: 03/13/20 12:08 PM  Result Value Ref Range   Glucose-Capillary 155 (H) 70 - 99 mg/dL    Comment: Glucose reference range applies only  to samples taken after fasting for at least 8 hours.   Comment 1 Notify RN    Comment 2 Document in Chart   Glucose, capillary     Status: Abnormal   Collection Time: 03/13/20  4:28 PM  Result Value Ref Range   Glucose-Capillary 179 (H) 70 - 99 mg/dL    Comment: Glucose reference range applies only to samples taken after fasting for at least 8 hours.   Comment 1 Notify RN    Comment 2 Document in Chart    No results found.  Assessment:  Thomas Morton is a 87 y.o. 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  He denies any aspiration event.    He has had progressive pancytopenia over the past several months with associated macrocytic RBC indices suggestive of a myelodysplastic syndrome.  He was diagnosed with B12 deficiency on 01/19/2020.  He has been on B12 supplementation every other week since that time.  Peripheral smear reveals thrombocytopenia, macrocytic anemia, circulating nucleated RBCs, neutropenia and hypersegmented neutrophils.  There were many atypical cells with varied appearances.  There were several blasts and other large cells having a lymphoid appearance. Flow cytometry is pending.  Symptomatically, he is feeling better.  Plan:   1.   Stage II squamous cell carcinoma of the larynx             Patient  Has been receiving radiation.             Treatment is currently on hold. 2.  Pancytopenia             Hematocrit 19.2.  Hemoglobin 6.5.  MCV 115.  Platelets 48,000.  WBC 1000 with an ANC of 200   Concern for a myelodysplastic syndrome with excess blasts or possible leukemia.              Peripheral blood flow cytometry is pending.   Anticipate bone marrow in the future.             Supportive care with PRBCs and platelets as needed.                         All blood products are leukopoor and irradiated.             Patient declines transfusion of PRBCs today despite review  of current CBC.    Anticipate hemoglobin will continue to decline likely due to bone marrow process.   Check retic count in a.m.  If low will readdress transfusion.  Continue neutropenic precautions. 3.  RUL pneumonia             Patient is clinically improving on Zosyn.  He was initially fluid resuscitated then on Levophed in the ICU.    Blood cultures revealed 1 of 4 bottles positive for staph (felt to be a contaminant).             Blood cultures every 24 hours as needed temperature greater than or equal to 100.4.    Lequita Asal, MD  03/13/2020, 9:06 PM

## 2020-03-13 NOTE — Progress Notes (Addendum)
SLP F/U Note  Patient Details Name: Thomas Morton MRN: [id number] DOB: 14-Feb-1927   Cancelled treatment:       Reason Eval/Treat Not Completed:  (chart reviewed; consulted pt and Son in room). Met w/ pt in room. Both pt and Son stated pt has eaten "well" and described his last ~2 meals: fish and potatoes; egg and cheese omelette. Pt and Son denied any difficulty swallowing at the meals stating he ate "most" of the meals. Pt did endorse ongoing mild sore throat. He does have chloraseptic spray but does not use if before meals, po's. Education discussed on oral care during ongoing XRT txs for laryngeal Ca. Handouts given. Pt has Dysphonia/Aphonia suspected impacted by XRT txs and vocal cord immobility. Recommended f/u w/ ENT during/post his XRT txs for direct viewing, then f/u w/ Outpt ST services for Voice Therapy if desired. Gave brief education on vocal hygiene and care, handout. Pt and Son agreed. NSG to reconsult if new issues arise during admit. Addendum: pt is on [place] O2 currently - recommend humidified air for moisture of oropharynx, larynx.     Orinda Kenner, MS, CCC-SLP Speech Language Pathologist Rehab Services [phone] [facility] 03/13/2020, 6:00 PM

## 2020-03-14 ENCOUNTER — Ambulatory Visit: Payer: Medicare HMO

## 2020-03-14 DIAGNOSIS — D849 Immunodeficiency, unspecified: Secondary | ICD-10-CM | POA: Diagnosis not present

## 2020-03-14 DIAGNOSIS — I5022 Chronic systolic (congestive) heart failure: Secondary | ICD-10-CM | POA: Diagnosis not present

## 2020-03-14 DIAGNOSIS — N179 Acute kidney failure, unspecified: Secondary | ICD-10-CM | POA: Diagnosis not present

## 2020-03-14 DIAGNOSIS — C9200 Acute myeloblastic leukemia, not having achieved remission: Secondary | ICD-10-CM | POA: Diagnosis not present

## 2020-03-14 DIAGNOSIS — Y95 Nosocomial condition: Secondary | ICD-10-CM | POA: Diagnosis not present

## 2020-03-14 DIAGNOSIS — I420 Dilated cardiomyopathy: Secondary | ICD-10-CM | POA: Diagnosis not present

## 2020-03-14 DIAGNOSIS — Z20822 Contact with and (suspected) exposure to covid-19: Secondary | ICD-10-CM | POA: Diagnosis not present

## 2020-03-14 DIAGNOSIS — Z66 Do not resuscitate: Secondary | ICD-10-CM | POA: Diagnosis not present

## 2020-03-14 DIAGNOSIS — I428 Other cardiomyopathies: Secondary | ICD-10-CM | POA: Diagnosis not present

## 2020-03-14 DIAGNOSIS — D61810 Antineoplastic chemotherapy induced pancytopenia: Secondary | ICD-10-CM | POA: Diagnosis not present

## 2020-03-14 DIAGNOSIS — C9500 Acute leukemia of unspecified cell type not having achieved remission: Secondary | ICD-10-CM

## 2020-03-14 DIAGNOSIS — I248 Other forms of acute ischemic heart disease: Secondary | ICD-10-CM | POA: Diagnosis not present

## 2020-03-14 DIAGNOSIS — I340 Nonrheumatic mitral (valve) insufficiency: Secondary | ICD-10-CM | POA: Diagnosis not present

## 2020-03-14 DIAGNOSIS — E785 Hyperlipidemia, unspecified: Secondary | ICD-10-CM | POA: Diagnosis not present

## 2020-03-14 DIAGNOSIS — L89152 Pressure ulcer of sacral region, stage 2: Secondary | ICD-10-CM | POA: Diagnosis not present

## 2020-03-14 DIAGNOSIS — J189 Pneumonia, unspecified organism: Secondary | ICD-10-CM | POA: Diagnosis not present

## 2020-03-14 DIAGNOSIS — Z8673 Personal history of transient ischemic attack (TIA), and cerebral infarction without residual deficits: Secondary | ICD-10-CM | POA: Diagnosis not present

## 2020-03-14 DIAGNOSIS — D61818 Other pancytopenia: Secondary | ICD-10-CM | POA: Diagnosis not present

## 2020-03-14 DIAGNOSIS — R0602 Shortness of breath: Secondary | ICD-10-CM | POA: Diagnosis present

## 2020-03-14 DIAGNOSIS — N1831 Chronic kidney disease, stage 3a: Secondary | ICD-10-CM | POA: Diagnosis not present

## 2020-03-14 DIAGNOSIS — J9601 Acute respiratory failure with hypoxia: Secondary | ICD-10-CM | POA: Diagnosis not present

## 2020-03-14 DIAGNOSIS — R4182 Altered mental status, unspecified: Secondary | ICD-10-CM | POA: Diagnosis not present

## 2020-03-14 DIAGNOSIS — R530 Neoplastic (malignant) related fatigue: Secondary | ICD-10-CM | POA: Diagnosis not present

## 2020-03-14 DIAGNOSIS — R5081 Fever presenting with conditions classified elsewhere: Secondary | ICD-10-CM | POA: Diagnosis not present

## 2020-03-14 DIAGNOSIS — E1142 Type 2 diabetes mellitus with diabetic polyneuropathy: Secondary | ICD-10-CM | POA: Diagnosis not present

## 2020-03-14 DIAGNOSIS — C329 Malignant neoplasm of larynx, unspecified: Secondary | ICD-10-CM | POA: Diagnosis not present

## 2020-03-14 DIAGNOSIS — E1169 Type 2 diabetes mellitus with other specified complication: Secondary | ICD-10-CM | POA: Diagnosis not present

## 2020-03-14 DIAGNOSIS — K219 Gastro-esophageal reflux disease without esophagitis: Secondary | ICD-10-CM | POA: Diagnosis not present

## 2020-03-14 DIAGNOSIS — I130 Hypertensive heart and chronic kidney disease with heart failure and stage 1 through stage 4 chronic kidney disease, or unspecified chronic kidney disease: Secondary | ICD-10-CM | POA: Diagnosis not present

## 2020-03-14 DIAGNOSIS — A419 Sepsis, unspecified organism: Secondary | ICD-10-CM | POA: Diagnosis not present

## 2020-03-14 DIAGNOSIS — I6782 Cerebral ischemia: Secondary | ICD-10-CM | POA: Diagnosis not present

## 2020-03-14 DIAGNOSIS — E872 Acidosis: Secondary | ICD-10-CM | POA: Diagnosis not present

## 2020-03-14 DIAGNOSIS — M1A9XX0 Chronic gout, unspecified, without tophus (tophi): Secondary | ICD-10-CM | POA: Diagnosis not present

## 2020-03-14 DIAGNOSIS — G319 Degenerative disease of nervous system, unspecified: Secondary | ICD-10-CM | POA: Diagnosis not present

## 2020-03-14 DIAGNOSIS — J440 Chronic obstructive pulmonary disease with acute lower respiratory infection: Secondary | ICD-10-CM | POA: Diagnosis not present

## 2020-03-14 DIAGNOSIS — R6521 Severe sepsis with septic shock: Secondary | ICD-10-CM | POA: Diagnosis not present

## 2020-03-14 DIAGNOSIS — I482 Chronic atrial fibrillation, unspecified: Secondary | ICD-10-CM | POA: Diagnosis not present

## 2020-03-14 DIAGNOSIS — E1122 Type 2 diabetes mellitus with diabetic chronic kidney disease: Secondary | ICD-10-CM | POA: Diagnosis not present

## 2020-03-14 LAB — CBC WITH DIFFERENTIAL/PLATELET
Abs Immature Granulocytes: 0.07 10*3/uL (ref 0.00–0.07)
Abs Immature Granulocytes: 0.02 10*3/uL (ref 0.00–0.07)
Basophils Absolute: 0 10*3/uL (ref 0.0–0.1)
Basophils Absolute: 0 10*3/uL (ref 0.0–0.1)
Basophils Relative: 0 %
Basophils Relative: 0 %
Eosinophils Absolute: 0 10*3/uL (ref 0.0–0.5)
Eosinophils Absolute: 0.1 10*3/uL (ref 0.0–0.5)
Eosinophils Relative: 2 %
Eosinophils Relative: 5 %
HCT: 19.2 % — ABNORMAL LOW (ref 39.0–52.0)
HCT: 19.4 % — ABNORMAL LOW (ref 39.0–52.0)
Hemoglobin: 6.5 g/dL — ABNORMAL LOW (ref 13.0–17.0)
Hemoglobin: 6.9 g/dL — ABNORMAL LOW (ref 13.0–17.0)
Immature Granulocytes: 7 %
Immature Granulocytes: 2 %
Lymphocytes Relative: 39 %
Lymphocytes Relative: 35 %
Lymphs Abs: 0.4 10*3/uL — ABNORMAL LOW (ref 0.7–4.0)
Lymphs Abs: 0.4 10*3/uL — ABNORMAL LOW (ref 0.7–4.0)
MCH: 38.9 pg — ABNORMAL HIGH (ref 26.0–34.0)
MCH: 40.1 pg — ABNORMAL HIGH (ref 26.0–34.0)
MCHC: 33.9 g/dL (ref 30.0–36.0)
MCHC: 35.6 g/dL (ref 30.0–36.0)
MCV: 115 fL — ABNORMAL HIGH (ref 80.0–100.0)
MCV: 112.8 fL — ABNORMAL HIGH (ref 80.0–100.0)
Monocytes Absolute: 0.4 10*3/uL (ref 0.1–1.0)
Monocytes Absolute: 0.5 10*3/uL (ref 0.1–1.0)
Monocytes Relative: 35 %
Monocytes Relative: 39 %
Neutro Abs: 0.2 10*3/uL — CL (ref 1.7–7.7)
Neutro Abs: 0.2 10*3/uL — CL (ref 1.7–7.7)
Neutrophils Relative %: 17 %
Neutrophils Relative %: 19 %
Platelets: 48 10*3/uL — ABNORMAL LOW (ref 150–400)
Platelets: 48 10*3/uL — ABNORMAL LOW (ref 150–400)
RBC: 1.67 MIL/uL — ABNORMAL LOW (ref 4.22–5.81)
RBC: 1.72 MIL/uL — ABNORMAL LOW (ref 4.22–5.81)
RDW: 15.4 % (ref 11.5–15.5)
RDW: 15.4 % (ref 11.5–15.5)
Smear Review: DECREASED
Smear Review: NORMAL
WBC: 1 10*3/uL — CL (ref 4.0–10.5)
WBC: 1.2 10*3/uL — CL (ref 4.0–10.5)
nRBC: 0 % (ref 0.0–0.2)
nRBC: 0 % (ref 0.0–0.2)

## 2020-03-14 LAB — COMP PANEL: LEUKEMIA/LYMPHOMA: Immunophenotypic Profile: 24

## 2020-03-14 LAB — RETICULOCYTES
Immature Retic Fract: 22.6 % — ABNORMAL HIGH (ref 2.3–15.9)
RBC.: 1.75 MIL/uL — ABNORMAL LOW (ref 4.22–5.81)
Retic Count, Absolute: 27.7 10*3/uL (ref 19.0–186.0)
Retic Ct Pct: 1.6 % (ref 0.4–3.1)

## 2020-03-14 LAB — CULTURE, BLOOD (ROUTINE X 2): Special Requests: ADEQUATE

## 2020-03-14 LAB — MAGNESIUM: Magnesium: 1.8 mg/dL (ref 1.7–2.4)

## 2020-03-14 LAB — GLUCOSE, CAPILLARY
Glucose-Capillary: 130 mg/dL — ABNORMAL HIGH (ref 70–99)
Glucose-Capillary: 231 mg/dL — ABNORMAL HIGH (ref 70–99)
Glucose-Capillary: 283 mg/dL — ABNORMAL HIGH (ref 70–99)
Glucose-Capillary: 232 mg/dL — ABNORMAL HIGH (ref 70–99)

## 2020-03-14 LAB — FIBRINOGEN: Fibrinogen: >750 mg/dL — ABNORMAL HIGH (ref 210–475)

## 2020-03-14 LAB — PROTIME-INR
INR: 1.1 (ref 0.8–1.2)
Prothrombin Time: 13.8 seconds (ref 11.4–15.2)

## 2020-03-14 LAB — ABO/RH: ABO/RH(D): B POS

## 2020-03-14 LAB — PREPARE RBC (CROSSMATCH)

## 2020-03-14 LAB — PHOSPHORUS: Phosphorus: 2.3 mg/dL — ABNORMAL LOW (ref 2.5–4.6)

## 2020-03-14 MED ORDER — ATORVASTATIN CALCIUM 40 MG PO TABS: 40.0000 mg | ORAL_TABLET | ORAL | Status: DC

## 2020-03-14 MED ORDER — ACETAMINOPHEN 650 MG RE SUPP: 650.0000 mg | Freq: Four times a day (QID) | RECTAL | 0 refills | Status: DC | PRN

## 2020-03-14 MED ORDER — ALBUTEROL SULFATE (2.5 MG/3ML) 0.083% IN NEBU: 2.5000 mg | INHALATION_SOLUTION | RESPIRATORY_TRACT | 12 refills | Status: AC | PRN

## 2020-03-14 MED ORDER — K PHOS MONO-SOD PHOS DI & MONO 155-852-130 MG PO TABS
500.0000 mg | ORAL_TABLET | ORAL | Status: AC
  Administered 2020-03-14 (×2): 500 mg via ORAL
  Filled 2020-03-14 (×2): qty 2

## 2020-03-14 MED ORDER — SODIUM CHLORIDE 0.9% IV SOLUTION: Freq: Once | INTRAVENOUS | Status: AC

## 2020-03-14 MED ORDER — ENSURE ENLIVE PO LIQD: 237.0000 mL | Freq: Two times a day (BID) | ORAL | 12 refills | Status: AC

## 2020-03-14 MED ORDER — PIPERACILLIN-TAZOBACTAM 3.375 G IVPB: 3.3750 g | Freq: Three times a day (TID) | INTRAVENOUS | Status: DC

## 2020-03-14 MED ORDER — CYANOCOBALAMIN 1000 MCG/ML IJ SOLN: 1000.0000 ug | INTRAMUSCULAR | 0 refills | Status: DC

## 2020-03-14 MED ORDER — FUROSEMIDE 10 MG/ML IJ SOLN
20.0000 mg | Freq: Once | INTRAMUSCULAR | Status: AC
  Administered 2020-03-14: 20 mg via INTRAVENOUS
  Filled 2020-03-14: qty 4

## 2020-03-14 MED ORDER — MIDODRINE HCL 5 MG PO TABS: 5.0000 mg | ORAL_TABLET | Freq: Three times a day (TID) | ORAL | Status: DC

## 2020-03-14 MED ORDER — ACETAMINOPHEN 325 MG PO TABS
650.0000 mg | ORAL_TABLET | Freq: Once | ORAL | Status: DC
  Filled 2020-03-14: qty 2

## 2020-03-14 MED ORDER — CHLORHEXIDINE GLUCONATE 0.12 % MT SOLN: 15.0000 mL | Freq: Two times a day (BID) | OROMUCOSAL | 0 refills | Status: DC

## 2020-03-14 MED ORDER — K PHOS MONO-SOD PHOS DI & MONO 155-852-130 MG PO TABS: 500.0000 mg | ORAL_TABLET | ORAL | Status: DC

## 2020-03-14 NOTE — Progress Notes (Signed)
Rulon Sera  A and O x 4 VSS. Pt tolerating diet well. No complaints of pain or nausea. IV removed intact, prescriptions given. Pt voices understanding of discharge instructions with no further questions. Pt discharged 03/14/20 via [facility].    Allergies as of 03/14/2020      Reactions   Sulfa Antibiotics Rash   Per patient low doses do not cause issues, only higher doses      Medication List    STOP taking these medications   betamethasone dipropionate 0.05 % cream   budesonide 0.5 MG/2ML nebulizer solution Commonly known as: PULMICORT   carvedilol 3.125 MG tablet Commonly known as: COREG   furosemide 20 MG tablet Commonly known as: LASIX   glimepiride 1 MG tablet Commonly known as: Amaryl   glipiZIDE 5 MG tablet Commonly known as: GLUCOTROL   HYDROcodone-acetaminophen 5-325 MG tablet Commonly known as: NORCO/VICODIN   indapamide 1.25 MG tablet Commonly known as: LOZOL   ipratropium-albuterol 0.5-2.5 (3) MG/3ML Soln Commonly known as: DUONEB   triamcinolone 0.025 % cream Commonly known as: KENALOG     TAKE these medications   acetaminophen 650 MG suppository Commonly known as: TYLENOL Place 1 suppository (650 mg total) rectally every 6 (six) hours as needed for mild pain or fever.   albuterol (2.5 MG/3ML) 0.083% nebulizer solution Commonly known as: PROVENTIL Take 3 mLs (2.5 mg total) by nebulization every 4 (four) hours as needed for wheezing or shortness of breath.   allopurinol 300 MG tablet Commonly known as: ZYLOPRIM Take 300 mg by mouth at bedtime. What changed: Another medication with the same name was removed. Continue taking this medication, and follow the directions you see here.   aspirin 325 MG EC tablet Take 1 tablet (325 mg total) by mouth daily.   atorvastatin 40 MG tablet Commonly known as: LIPITOR Take 1 tablet (40 mg total) by mouth every morning.   chlorhexidine 0.12 % solution Commonly known as: PERIDEX 15 mLs by Mouth Rinse route 2  (two) times daily.   cyanocobalamin 1000 MCG/ML injection Commonly known as: (VITAMIN B-12) Inject 1 mL (1,000 mcg total) into the muscle every 30 (thirty) days. What changed:   how much to take  when to take this  Another medication with the same name was removed. Continue taking this medication, and follow the directions you see here.   famotidine 20 MG tablet Commonly known as: PEPCID Take 20 mg by mouth 2 (two) times daily.   feeding supplement Liqd Take 237 mLs by mouth 2 (two) times daily between meals.   midodrine 5 MG tablet Commonly known as: PROAMATINE Take 1 tablet (5 mg total) by mouth 3 (three) times daily with meals.   pantoprazole 40 MG tablet Commonly known as: PROTONIX Take 1 tablet by mouth daily as needed (heartburn).   phosphorus 155-852-130 MG tablet Commonly known as: K PHOS NEUTRAL Take 2 tablets (500 mg total) by mouth every 4 (four) hours.   piperacillin-tazobactam 3.375 GM/50ML IVPB Commonly known as: ZOSYN Inject 50 mLs (3.375 g total) into the vein every 8 (eight) hours.       Vitals:   03/14/20 2104 03/14/20 2127  BP: (!) 119/59 124/61  Pulse: 85 (!) 108  Resp: 16 (!) 26  Temp: 97.8 F (36.6 C) 97.7 F (36.5 C)  SpO2: 100% 99%    Thomas Morton

## 2020-03-14 NOTE — TOC Initial Note (Signed)
Transition of Care [facility]) - Initial/Assessment Note    Patient Details  Name: Thomas Morton MRN: [id number] Date of Birth: 03-28-1927  [facility]) CM/SW Contact:    Beverly Sessions, RN Phone Number: 03/14/2020, 1:49 PM  Clinical Narrative:                 Patient admitted from home with sepsis Patient states that he lives at home alone.  2 adult sons live locally for support  PCP Miller Patient states at baseline he drives himself [employer] Denies any issues obtaining medications  No DME recommended PT.   Patient denies the need for assistive device  PT recommends home health PT.  Patient agreeable.  States he does not have a preference of home health agency.  Referral made and accepted by [place] with [facility].   Patient currently requiring acute O2 and may require at discharge   Expected Discharge Plan: [place] Barriers to Discharge: Continued Medical Work up   Patient Goals and CMS Choice   CMS Medicare.gov Compare Post Acute Care list provided to:: Patient Choice offered to / list presented to : Patient  Expected Discharge Plan and Services Expected Discharge Plan: [place]   Discharge Planning Services: CM Consult Post Acute Care Choice: [place] arrangements for the past 2 months: Single Family Home                           HH Arranged: PT,OT,RN [place]: [facility] Date [facility] Agency Contacted: 03/14/20   Representative spoke with at [place]: [place] Arrangements/Services Living arrangements for the past 2 months: [place] with:: Self Patient language and need for interpreter reviewed:: Yes Do you feel safe going back to the place where you live?: Yes      Need for Family Participation in Patient Care: Yes (Comment) Care giver support system in place?: Yes (comment)   Criminal Activity/Legal Involvement Pertinent to Current  Situation/Hospitalization: No - Comment as needed  Activities of Daily Living      Permission Sought/Granted                  Emotional Assessment       Orientation: : Oriented to Self,Oriented to Place,Oriented to  Time,Oriented to Situation      Admission diagnosis:  Shortness of breath [R06.02] Aspiration pneumonia ([place]) [J69.0] Acute respiratory failure with hypoxia ([place]) [J96.01] AKI (acute kidney injury) ([place]) [N17.9] HCAP (healthcare-associated pneumonia) [J18.9] Sepsis with acute renal failure without septic shock, due to unspecified organism, unspecified acute renal failure type ([place]) [A41.9, R65.20, N17.9] Patient Active Problem List   Diagnosis Date Noted   Pressure injury of skin 50/27/7412   Chronic systolic CHF (congestive heart failure) ([place])    Aspiration pneumonia ([place]) 03/11/2020   HCAP (healthcare-associated pneumonia) 03/11/2020   Severe sepsis with septic shock ([place]) 03/11/2020   Acute renal failure superimposed on stage 3a chronic kidney disease ([place]) 03/11/2020   GERD (gastroesophageal reflux disease)    Chronic gouty arthritis    History of cardioembolic cerebrovascular accident (CVA)    Hyperlipidemia    Type 2 diabetes mellitus with hyperlipidemia (HCC)    Atrial fibrillation, chronic (HCC)    Pancytopenia (HCC)    Elevated troponin    Larynx cancer ([place])    Acute respiratory failure with hypoxia ([place]) 12/22/2017   Dizziness and giddiness 10/15/2015  CVA (cerebral vascular accident) ([place]) 10/14/2015   Numbness 10/14/2015   Chronic renal insufficiency 10/14/2015   Essential hypertension 10/14/2015   PCP:  Rusty Aus, MD Pharmacy:   [place], [place] - [address] [address] Phone: [phone] Fax: [phone]     Social Determinants of Health (SDOH) Interventions    Readmission Risk Interventions No flowsheet data found.

## 2020-03-14 NOTE — Progress Notes (Signed)
Report called to [facility] oncology floor, bed [phone]. Transport was on the line at the same time. They will be sending transport for the patient. Charge nurse and night nurse updated on the patient status.

## 2020-03-14 NOTE — Discharge Summary (Signed)
[place] [facility] [place] NAME: Thomas Morton    MR#:  [id number]  DATE OF BIRTH:  03/15/1927  DATE OF ADMISSION:  03/11/2020 ADMITTING PHYSICIAN: Ivor Costa, MD  DATE OF DISCHARGE TO [facility]:  85/03/15/2019  PRIMARY CARE PHYSICIAN: Rusty Aus, MD    ADMISSION DIAGNOSIS:  Shortness of breath [R06.02] Aspiration pneumonia ([place]) [J69.0] Acute respiratory failure with hypoxia ([place]) [J96.01] AKI (acute kidney injury) ([place]) [N17.9] HCAP (healthcare-associated pneumonia) [J18.9] Sepsis with acute renal failure without septic shock, due to unspecified organism, unspecified acute renal failure type ([place]) [A41.9, R65.20, N17.9]  DISCHARGE DIAGNOSIS:  Principal Problem:   Acute respiratory failure with hypoxia ([place]) Active Problems:   CVA (cerebral vascular accident) ([place])   Essential hypertension   Aspiration pneumonia ([place])   HCAP (healthcare-associated pneumonia)   Severe sepsis with septic shock ([place])   Acute renal failure superimposed on stage 3a chronic kidney disease (HCC)   GERD (gastroesophageal reflux disease)   Chronic gouty arthritis   History of cardioembolic cerebrovascular accident (CVA)   Hyperlipidemia   Type 2 diabetes mellitus with hyperlipidemia (HCC)   Atrial fibrillation, chronic (HCC)   Pancytopenia (HCC)   Elevated troponin   Larynx cancer (HCC)   Pressure injury of skin   Chronic systolic CHF (congestive heart failure) ([place])   SECONDARY DIAGNOSIS:   Past Medical History:  Diagnosis Date   Afib ([place])    Aortic atherosclerosis (HCC)    Arthritis    B12 deficiency    Bilateral carotid artery stenosis    Cancer ([place])    prostate 2001   Cardiac defibrillator in situ    CHF (congestive heart failure) (HCC)    Chronic gouty arthritis    Diabetes mellitus without complication (HCC)    Diabetic sensorimotor neuropathy (HCC)    Emphysema lung (HCC)    GERD (gastroesophageal reflux disease)    History of  cardioembolic cerebrovascular accident (CVA)    History of DVT (deep vein thrombosis)    History of kidney stones    H/O   History of prostatectomy    Hyperlipidemia    LBBB (left bundle branch block)    NICM (nonischemic cardiomyopathy) ([place])     HOSPITAL COURSE:   1.  Severe sepsis with septic shock secondary to right upper lobe pneumonia.  Initially the patient required Levophed.  1 out of 4 bottles shows staph species which is likely contaminant.  The patient's respiratory panel was positive for rhinovirus.  Covid negative.  Patient was empirically on antibiotics.  Currently on IV Zosyn (started on 03/11/2020).  Patient on low-dose midodrine. 2.  Acute leukemia.  Patient seen by Dr. Mike Gip oncologist and she got a call back from the lab about 24% blasts.  She thinks it may have been a myelodysplastic syndrome that converted over to acute leukemia.  She discussed the case at [facility] and the patient will be transferred under the care of Dr. Leida Lauth. 3.  Acute kidney injury on chronic disease stage IIIa.  Initial creatinine 2.03.  Last creatinine 1.32. 4.  Pancytopenia.  Today's white blood cell count up at 1.2.  Patient's hemoglobin 6.9.  Patient agreeable to blood transfusion today with leukocyte poor and irradiated blood.  Patient's platelet count stable at 48. 5.  Type 2 diabetes mellitus with hyperlipidemia unspecified on atorvastatin.  Patient's hemoglobin A1c 6.5.  Patient on sliding scale insulin while here. 6.  History of laryngeal cancer.  Has been receiving radiation treatment as outpatient. 7.  Chronic systolic congestive heart failure.  No signs of heart failure on this hospital stay. 8.  History of gout on allopurinol 9.  Chronic atrial fibrillation history of CVA.  Currently on aspirin only for anticoagulation and Lipitor 10.  Stage II coccyx decubitus present on admission.  Partial thickness loss of dermis presenting as a shallow open injury with a red-pink wound bed without  slough. 11.  Patient currently on 2 L of oxygen taper off oxygen when able to do so.  DISCHARGE CONDITIONS:   [place]  CONSULTS OBTAINED:  Treatment Team:  Lequita Asal, MD  DRUG ALLERGIES:   Allergies  Allergen Reactions   Sulfa Antibiotics Rash    Per patient low doses do not cause issues, only higher doses    DISCHARGE MEDICATIONS:   Allergies as of 03/14/2020      Reactions   Sulfa Antibiotics Rash   Per patient low doses do not cause issues, only higher doses      Medication List    STOP taking these medications   betamethasone dipropionate 0.05 % cream   budesonide 0.5 MG/2ML nebulizer solution Commonly known as: PULMICORT   carvedilol 3.125 MG tablet Commonly known as: COREG   furosemide 20 MG tablet Commonly known as: LASIX   glimepiride 1 MG tablet Commonly known as: Amaryl   glipiZIDE 5 MG tablet Commonly known as: GLUCOTROL   HYDROcodone-acetaminophen 5-325 MG tablet Commonly known as: NORCO/VICODIN   indapamide 1.25 MG tablet Commonly known as: LOZOL   ipratropium-albuterol 0.5-2.5 (3) MG/3ML Soln Commonly known as: DUONEB   triamcinolone 0.025 % cream Commonly known as: KENALOG     TAKE these medications   acetaminophen 650 MG suppository Commonly known as: TYLENOL Place 1 suppository (650 mg total) rectally every 6 (six) hours as needed for mild pain or fever.   albuterol (2.5 MG/3ML) 0.083% nebulizer solution Commonly known as: PROVENTIL Take 3 mLs (2.5 mg total) by nebulization every 4 (four) hours as needed for wheezing or shortness of breath.   allopurinol 300 MG tablet Commonly known as: ZYLOPRIM Take 300 mg by mouth at bedtime. What changed: Another medication with the same name was removed. Continue taking this medication, and follow the directions you see here.   aspirin 325 MG EC tablet Take 1 tablet (325 mg total) by mouth daily.   atorvastatin 40 MG tablet Commonly known as: LIPITOR Take 1 tablet (40 mg total)  by mouth every morning.   chlorhexidine 0.12 % solution Commonly known as: PERIDEX 15 mLs by Mouth Rinse route 2 (two) times daily.   cyanocobalamin 1000 MCG/ML injection Commonly known as: (VITAMIN B-12) Inject 1 mL (1,000 mcg total) into the muscle every 30 (thirty) days. What changed:   how much to take  when to take this  Another medication with the same name was removed. Continue taking this medication, and follow the directions you see here.   famotidine 20 MG tablet Commonly known as: PEPCID Take 20 mg by mouth 2 (two) times daily.   feeding supplement Liqd Take 237 mLs by mouth 2 (two) times daily between meals.   midodrine 5 MG tablet Commonly known as: PROAMATINE Take 1 tablet (5 mg total) by mouth 3 (three) times daily with meals.   pantoprazole 40 MG tablet Commonly known as: PROTONIX Take 1 tablet by mouth daily as needed (heartburn).   phosphorus 155-852-130 MG tablet Commonly known as: K PHOS NEUTRAL Take 2 tablets (500 mg total) by mouth every 4 (four) hours.  piperacillin-tazobactam 3.375 GM/50ML IVPB Commonly known as: ZOSYN Inject 50 mLs (3.375 g total) into the vein every 8 (eight) hours.        DISCHARGE INSTRUCTIONS:   Follow-up with team at [facility] 1 day  If you experience worsening of your admission symptoms, develop shortness of breath, life threatening emergency, suicidal or homicidal thoughts you must seek medical attention immediately by calling 911 or calling your MD immediately  if symptoms less severe.  You Must read complete instructions/literature along with all the possible adverse reactions/side effects for all the Medicines you take and that have been prescribed to you. Take any new Medicines after you have completely understood and accept all the possible adverse reactions/side effects.   Please note  You were cared for by a hospitalist during your hospital stay. If you have any questions about your discharge medications or the  care you received while you were in the hospital after you are discharged, you can call the unit and asked to speak with the hospitalist on call if the hospitalist that took care of you is not available. Once you are discharged, your primary care physician will handle any further medical issues. Please note that NO REFILLS for any discharge medications will be authorized once you are discharged, as it is imperative that you return to your primary care physician (or establish a relationship with a primary care physician if you do not have one) for your aftercare needs so that they can reassess your need for medications and monitor your lab values.    Today   CHIEF COMPLAINT:   Chief Complaint  Patient presents with   Shortness of Breath    HISTORY OF PRESENT ILLNESS:  Thomas Morton  is a 87 y.o. male came in with shortness of breath and found to have pneumonia and sepsis.   VITAL SIGNS:  Blood pressure (!) 112/51, pulse 83, temperature (!) 97.4 F (36.3 C), resp. rate 16, height 5\' 10"  (1.778 m), weight 82.7 kg, SpO2 98 %.  I/O:    Intake/Output Summary (Last 24 hours) at 03/14/2020 1456 Last data filed at 03/14/2020 0522 Gross per 24 hour  Intake 200 ml  Output 676 ml  Net -476 ml    PHYSICAL EXAMINATION:  GENERAL:  87 y.o.-year-old patient lying in the bed with no acute distress.  EYES: Pupils equal, round, reactive to light and accommodation. No scleral icterus. HEENT: Head atraumatic, normocephalic. Oropharynx and nasopharynx clear.   LUNGS: Normal breath sounds bilaterally, no wheezing, rales,rhonchi or crepitation. No use of accessory muscles of respiration.  CARDIOVASCULAR: S1, S2 normal.  2/6 systolic murmurs.  ABDOMEN: Soft, non-tender, non-distended.  EXTREMITIES: Trace pedal edema.  NEUROLOGIC: Cranial nerves II through XII are intact. Muscle strength 5/5 in all extremities. Sensation intact. Gait not checked.  PSYCHIATRIC: The patient is alert and oriented x 3.   SKIN: No obvious rash, lesion, or ulcer.   DATA REVIEW:   CBC Recent Labs  Lab 03/14/20 0532  WBC 1.2*  HGB 6.9*  HCT 19.4*  PLT 48*    Chemistries  Recent Labs  Lab 03/12/20 0523 03/13/20 0526 03/14/20 0532  NA 137 133*  --   K 3.6 3.6  --   CL 109 104  --   CO2 23 25  --   GLUCOSE 176* 153*  --   BUN 25* 23  --   CREATININE 1.50* 1.32*  --   CALCIUM 7.3* 7.4*  --   MG 1.2*  --  1.8  AST 20  --   --   ALT 12  --   --   ALKPHOS 40  --   --   BILITOT 0.7  --   --      Microbiology Results  Results for orders placed or performed during the hospital encounter of 03/11/20  Resp Panel by RT-PCR (Flu A&B, Covid) Nasopharyngeal Swab     Status: None   Collection Time: 03/11/20 10:24 AM   Specimen: Nasopharyngeal Swab; Nasopharyngeal(NP) swabs in vial transport medium  Result Value Ref Range Status   SARS Coronavirus 2 by RT PCR NEGATIVE NEGATIVE Final    Comment: (NOTE) SARS-CoV-2 target nucleic acids are NOT DETECTED.  The SARS-CoV-2 RNA is generally detectable in upper respiratory specimens during the acute phase of infection. The lowest concentration of SARS-CoV-2 viral copies this assay can detect is 138 copies/mL. A negative result does not preclude SARS-Cov-2 infection and should not be used as the sole basis for treatment or other patient management decisions. A negative result may occur with  improper specimen collection/handling, submission of specimen other than nasopharyngeal swab, presence of viral mutation(s) within the areas targeted by this assay, and inadequate number of viral copies(<138 copies/mL). A negative result must be combined with clinical observations, patient history, and epidemiological information. The expected result is Negative.  Fact Sheet for Patients:  EntrepreneurPulse.com.au  Fact Sheet for Healthcare Providers:  IncredibleEmployment.be  This test is no t yet approved or cleared by the  [place] FDA and  has been authorized for detection and/or diagnosis of SARS-CoV-2 by FDA under an Emergency Use Authorization (EUA). This EUA will remain  in effect (meaning this test can be used) for the duration of the COVID-19 declaration under Section 564(b)(1) of the Act, 21 U.S.C.section 360bbb-3(b)(1), unless the authorization is terminated  or revoked sooner.       Influenza A by PCR NEGATIVE NEGATIVE Final   Influenza B by PCR NEGATIVE NEGATIVE Final    Comment: (NOTE) The Xpert Xpress SARS-CoV-2/FLU/RSV plus assay is intended as an aid in the diagnosis of influenza from Nasopharyngeal swab specimens and should not be used as a sole basis for treatment. Nasal washings and aspirates are unacceptable for Xpert Xpress SARS-CoV-2/FLU/RSV testing.  Fact Sheet for Patients: EntrepreneurPulse.com.au  Fact Sheet for Healthcare Providers: IncredibleEmployment.be  This test is not yet approved or cleared by the [place] FDA and has been authorized for detection and/or diagnosis of SARS-CoV-2 by FDA under an Emergency Use Authorization (EUA). This EUA will remain in effect (meaning this test can be used) for the duration of the COVID-19 declaration under Section 564(b)(1) of the Act, 21 U.S.C. section 360bbb-3(b)(1), unless the authorization is terminated or revoked.  Performed at [facility], [address] [place], [place] [address]   Culture, blood (routine x 2)     Status: None (Preliminary result)   Collection Time: 03/11/20 10:25 AM   Specimen: BLOOD  Result Value Ref Range Status   Specimen Description BLOOD BLOOD LEFT WRIST  Final   Special Requests   Final    BOTTLES DRAWN AEROBIC AND ANAEROBIC Blood Culture adequate volume   Culture   Final    NO GROWTH 3 DAYS Performed at [facility], [address] [place], [place] [address]    Report Status PENDING  Incomplete  Culture, blood (routine x 2)      Status: Abnormal   Collection Time: 03/11/20 10:25 AM   Specimen: BLOOD  Result Value Ref Range  Status   Specimen Description   Final    BLOOD LEFT ANTECUBITAL Performed at [facility], [address] [place], [place] [address]    Special Requests   Final    BOTTLES DRAWN AEROBIC AND ANAEROBIC Blood Culture adequate volume Performed at [facility], [address] [place], [place] [address]    Culture  Setup Time   Final    Organism ID to follow [place] CRITICAL RESULT CALLED TO, READ BACK BY AND VERIFIED WITH: Laqueta Carina AT 1003 03/12/20 [place] Performed at [facility], [address] [place], [place] [address]    Culture (A)  Final    STAPHYLOCOCCUS AURICULARIS THE SIGNIFICANCE OF ISOLATING THIS ORGANISM FROM A SINGLE SET OF BLOOD CULTURES WHEN MULTIPLE SETS ARE DRAWN IS UNCERTAIN. PLEASE NOTIFY THE MICROBIOLOGY DEPARTMENT WITHIN ONE WEEK IF SPECIATION AND SENSITIVITIES ARE REQUIRED. Performed at [facility], [address], [place], [place] [address]    Report Status 03/14/2020 FINAL  Final  Blood Culture ID Panel (Reflexed)     Status: Abnormal   Collection Time: 03/11/20 10:25 AM  Result Value Ref Range Status   Enterococcus faecalis NOT DETECTED NOT DETECTED Final   Enterococcus Faecium NOT DETECTED NOT DETECTED Final   Listeria monocytogenes NOT DETECTED NOT DETECTED Final   Staphylococcus species DETECTED (A) NOT DETECTED Final    Comment: CRITICAL RESULT CALLED TO, READ BACK BY AND VERIFIED WITH:  SUSAN WATSON AT 1003 03/12/20 SDR    Staphylococcus aureus (BCID) NOT DETECTED NOT DETECTED Final   Staphylococcus epidermidis NOT DETECTED NOT DETECTED Final   Staphylococcus lugdunensis NOT DETECTED NOT DETECTED Final   Streptococcus species NOT DETECTED NOT DETECTED Final   Streptococcus agalactiae NOT DETECTED NOT DETECTED Final   Streptococcus pneumoniae NOT DETECTED NOT DETECTED Final    Streptococcus pyogenes NOT DETECTED NOT DETECTED Final   A.calcoaceticus-baumannii NOT DETECTED NOT DETECTED Final   Bacteroides fragilis NOT DETECTED NOT DETECTED Final   Enterobacterales NOT DETECTED NOT DETECTED Final   Enterobacter cloacae complex NOT DETECTED NOT DETECTED Final   Escherichia coli NOT DETECTED NOT DETECTED Final   Klebsiella aerogenes NOT DETECTED NOT DETECTED Final   Klebsiella oxytoca NOT DETECTED NOT DETECTED Final   Klebsiella pneumoniae NOT DETECTED NOT DETECTED Final   Proteus species NOT DETECTED NOT DETECTED Final   Salmonella species NOT DETECTED NOT DETECTED Final   Serratia marcescens NOT DETECTED NOT DETECTED Final   Haemophilus influenzae NOT DETECTED NOT DETECTED Final   Neisseria meningitidis NOT DETECTED NOT DETECTED Final   Pseudomonas aeruginosa NOT DETECTED NOT DETECTED Final   Stenotrophomonas maltophilia NOT DETECTED NOT DETECTED Final   Candida albicans NOT DETECTED NOT DETECTED Final   Candida auris NOT DETECTED NOT DETECTED Final   Candida glabrata NOT DETECTED NOT DETECTED Final   Candida krusei NOT DETECTED NOT DETECTED Final   Candida parapsilosis NOT DETECTED NOT DETECTED Final   Candida tropicalis NOT DETECTED NOT DETECTED Final   Cryptococcus neoformans/gattii NOT DETECTED NOT DETECTED Final    Comment: Performed at [facility], [address] [place], [place] [address]  MRSA PCR Screening     Status: None   Collection Time: 03/11/20  5:25 PM   Specimen: Nasal Mucosa; Nasopharyngeal  Result Value Ref Range Status   MRSA by PCR NEGATIVE NEGATIVE Final    Comment:        The GeneXpert MRSA Assay (FDA approved for NASAL specimens only), is one component  of a comprehensive MRSA colonization surveillance program. It is not intended to diagnose MRSA infection nor to guide or monitor treatment for MRSA infections. Performed at [facility], [address] [place], [place] [address]       Management plans  discussed with the patient, family and they are in agreement.  CODE STATUS:     Code Status Orders  (From admission, onward)         Start     Ordered   03/12/20 0719  Do not attempt resuscitation (DNR)  Continuous       Question Answer Comment  In the event of cardiac or respiratory ARREST Do not call a code blue   In the event of cardiac or respiratory ARREST Do not perform Intubation, CPR, defibrillation or ACLS   In the event of cardiac or respiratory ARREST Use medication by any route, position, wound care, and other measures to relive pain and suffering. May use oxygen, suction and manual treatment of airway obstruction as needed for comfort.      03/12/20 0718        Code Status History    Date Active Date Inactive Code Status Order ID Comments User Context   03/11/2020 2206 03/12/2020 0718 Partial Code [id number] Vasopressor administration only Rust[place]Chester, Huel Cote, NP Inpatient   03/11/2020 1926 03/11/2020 2206 Full Code [id number]  Ivor Costa, MD ED   03/11/2020 1301 03/11/2020 1926 Partial Code [id number] No CPR or intubation, but vassopressor OK per his son Ivor Costa, MD ED   03/11/2020 1301 03/11/2020 1301 Partial Code [id number]  Ivor Costa, MD ED   12/22/2017 1620 12/25/2017 2246 Full Code [id number]  Loletha Grayer, MD Inpatient   10/14/2015 1055 10/15/2015 1625 Full Code [id number]  Theodoro Grist, MD Inpatient   Advance Care Planning Activity    Advance Directive Documentation   Flowsheet Row Most Recent Value  Type of Advance Directive Healthcare Power of Attorney, Living will  Pre-existing out of facility DNR order (yellow form or pink MOST form) --  "MOST" Form in Place? --      TOTAL TIME TAKING CARE OF THIS PATIENT: 35 minutes.    Loletha Grayer M.D on 03/14/2020 at 2:56 PM  Between 7am to 6pm - Pager - [phone]  After 6pm go to [email or web address] - password [email or web address]  [facility]  CC: Primary care physician; Rusty Aus, MD

## 2020-03-14 NOTE — Care Management Important Message (Signed)
Important Message  Patient Details  Name: Thomas Morton MRN: [id number] Date of Birth: 05-26-1927   Medicare Important Message Given:  Yes     [name] 03/14/2020, 2:18 PM

## 2020-03-15 ENCOUNTER — Ambulatory Visit: Payer: Medicare HMO

## 2020-03-15 DIAGNOSIS — R530 Neoplastic (malignant) related fatigue: Secondary | ICD-10-CM | POA: Diagnosis not present

## 2020-03-15 DIAGNOSIS — R5081 Fever presenting with conditions classified elsewhere: Secondary | ICD-10-CM | POA: Diagnosis not present

## 2020-03-15 DIAGNOSIS — I6782 Cerebral ischemia: Secondary | ICD-10-CM | POA: Diagnosis not present

## 2020-03-15 DIAGNOSIS — E1142 Type 2 diabetes mellitus with diabetic polyneuropathy: Secondary | ICD-10-CM | POA: Diagnosis not present

## 2020-03-15 DIAGNOSIS — C9200 Acute myeloblastic leukemia, not having achieved remission: Secondary | ICD-10-CM | POA: Diagnosis not present

## 2020-03-15 DIAGNOSIS — G319 Degenerative disease of nervous system, unspecified: Secondary | ICD-10-CM | POA: Diagnosis not present

## 2020-03-15 DIAGNOSIS — I340 Nonrheumatic mitral (valve) insufficiency: Secondary | ICD-10-CM | POA: Diagnosis not present

## 2020-03-15 DIAGNOSIS — Z8673 Personal history of transient ischemic attack (TIA), and cerebral infarction without residual deficits: Secondary | ICD-10-CM | POA: Diagnosis not present

## 2020-03-15 DIAGNOSIS — D61810 Antineoplastic chemotherapy induced pancytopenia: Secondary | ICD-10-CM | POA: Diagnosis not present

## 2020-03-15 DIAGNOSIS — R4182 Altered mental status, unspecified: Secondary | ICD-10-CM | POA: Diagnosis not present

## 2020-03-15 LAB — TYPE AND SCREEN
ABO/RH(D): B POS
Antibody Screen: NEGATIVE
Unit division: 0

## 2020-03-15 LAB — BPAM RBC
Blood Product Expiration Date: 202203092359
ISSUE DATE / TIME: 202203031610
Unit Type and Rh: 5100

## 2020-03-16 DIAGNOSIS — D61810 Antineoplastic chemotherapy induced pancytopenia: Secondary | ICD-10-CM | POA: Diagnosis not present

## 2020-03-16 DIAGNOSIS — C9200 Acute myeloblastic leukemia, not having achieved remission: Secondary | ICD-10-CM | POA: Diagnosis not present

## 2020-03-16 DIAGNOSIS — R5081 Fever presenting with conditions classified elsewhere: Secondary | ICD-10-CM | POA: Diagnosis not present

## 2020-03-16 DIAGNOSIS — R530 Neoplastic (malignant) related fatigue: Secondary | ICD-10-CM | POA: Diagnosis not present

## 2020-03-16 LAB — PTT FACTOR INHIBITOR (MIXING STUDY): aPTT: 30.5 s — ABNORMAL HIGH (ref 22.9–30.2)

## 2020-03-16 LAB — CULTURE, BLOOD (ROUTINE X 2)
Culture: NO GROWTH
Special Requests: ADEQUATE

## 2020-03-17 DIAGNOSIS — R5081 Fever presenting with conditions classified elsewhere: Secondary | ICD-10-CM | POA: Diagnosis not present

## 2020-03-17 DIAGNOSIS — C9200 Acute myeloblastic leukemia, not having achieved remission: Secondary | ICD-10-CM | POA: Diagnosis not present

## 2020-03-17 DIAGNOSIS — R530 Neoplastic (malignant) related fatigue: Secondary | ICD-10-CM | POA: Diagnosis not present

## 2020-03-17 DIAGNOSIS — D61810 Antineoplastic chemotherapy induced pancytopenia: Secondary | ICD-10-CM | POA: Diagnosis not present

## 2020-03-18 ENCOUNTER — Ambulatory Visit: Payer: Medicare HMO

## 2020-03-18 DIAGNOSIS — C9200 Acute myeloblastic leukemia, not having achieved remission: Secondary | ICD-10-CM | POA: Diagnosis not present

## 2020-03-19 ENCOUNTER — Ambulatory Visit: Payer: Medicare HMO

## 2020-03-20 ENCOUNTER — Inpatient Hospital Stay: Payer: Medicare HMO | Attending: Radiation Oncology

## 2020-03-20 ENCOUNTER — Ambulatory Visit: Payer: Medicare HMO

## 2020-03-20 DIAGNOSIS — C329 Malignant neoplasm of larynx, unspecified: Secondary | ICD-10-CM | POA: Insufficient documentation

## 2020-03-20 DIAGNOSIS — J189 Pneumonia, unspecified organism: Secondary | ICD-10-CM | POA: Insufficient documentation

## 2020-03-20 DIAGNOSIS — Z87891 Personal history of nicotine dependence: Secondary | ICD-10-CM | POA: Insufficient documentation

## 2020-03-20 DIAGNOSIS — D499 Neoplasm of unspecified behavior of unspecified site: Secondary | ICD-10-CM | POA: Diagnosis not present

## 2020-03-20 DIAGNOSIS — R21 Rash and other nonspecific skin eruption: Secondary | ICD-10-CM | POA: Insufficient documentation

## 2020-03-20 DIAGNOSIS — C9200 Acute myeloblastic leukemia, not having achieved remission: Secondary | ICD-10-CM | POA: Diagnosis not present

## 2020-03-20 DIAGNOSIS — Z66 Do not resuscitate: Secondary | ICD-10-CM | POA: Insufficient documentation

## 2020-03-20 DIAGNOSIS — R490 Dysphonia: Secondary | ICD-10-CM | POA: Insufficient documentation

## 2020-03-21 ENCOUNTER — Ambulatory Visit: Payer: Medicare HMO

## 2020-03-22 ENCOUNTER — Ambulatory Visit: Payer: Medicare HMO

## 2020-03-24 DIAGNOSIS — I6523 Occlusion and stenosis of bilateral carotid arteries: Secondary | ICD-10-CM | POA: Diagnosis not present

## 2020-03-24 DIAGNOSIS — E785 Hyperlipidemia, unspecified: Secondary | ICD-10-CM | POA: Diagnosis not present

## 2020-03-24 DIAGNOSIS — I700 Atherosclerosis of aorta: Secondary | ICD-10-CM | POA: Diagnosis not present

## 2020-03-24 DIAGNOSIS — I110 Hypertensive heart disease with heart failure: Secondary | ICD-10-CM | POA: Diagnosis not present

## 2020-03-24 DIAGNOSIS — I5022 Chronic systolic (congestive) heart failure: Secondary | ICD-10-CM | POA: Diagnosis not present

## 2020-03-24 DIAGNOSIS — I480 Paroxysmal atrial fibrillation: Secondary | ICD-10-CM | POA: Diagnosis not present

## 2020-03-24 DIAGNOSIS — M1A9XX0 Chronic gout, unspecified, without tophus (tophi): Secondary | ICD-10-CM | POA: Diagnosis not present

## 2020-03-24 DIAGNOSIS — C9200 Acute myeloblastic leukemia, not having achieved remission: Secondary | ICD-10-CM | POA: Diagnosis not present

## 2020-03-24 DIAGNOSIS — E1151 Type 2 diabetes mellitus with diabetic peripheral angiopathy without gangrene: Secondary | ICD-10-CM | POA: Diagnosis not present

## 2020-03-25 ENCOUNTER — Ambulatory Visit
Admission: RE | Admit: 2020-03-25 | Discharge: 2020-03-25 | Disposition: A | Discharge status: Home / Self Care | Payer: Medicare HMO | Source: Ambulatory Visit | Attending: Radiation Oncology | Admitting: Radiation Oncology

## 2020-03-25 DIAGNOSIS — C329 Malignant neoplasm of larynx, unspecified: Secondary | ICD-10-CM | POA: Insufficient documentation

## 2020-03-25 DIAGNOSIS — Z510 Encounter for antineoplastic radiation therapy: Secondary | ICD-10-CM | POA: Diagnosis not present

## 2020-03-26 ENCOUNTER — Ambulatory Visit
Admission: RE | Admit: 2020-03-26 | Discharge: 2020-03-26 | Disposition: A | Discharge status: Home / Self Care | Payer: Medicare HMO | Source: Ambulatory Visit | Attending: Radiation Oncology | Admitting: Radiation Oncology

## 2020-03-26 ENCOUNTER — Telehealth: Payer: Self-pay | Admitting: *Deleted

## 2020-03-26 DIAGNOSIS — C329 Malignant neoplasm of larynx, unspecified: Secondary | ICD-10-CM | POA: Diagnosis not present

## 2020-03-26 DIAGNOSIS — E1151 Type 2 diabetes mellitus with diabetic peripheral angiopathy without gangrene: Secondary | ICD-10-CM | POA: Diagnosis not present

## 2020-03-26 DIAGNOSIS — E1122 Type 2 diabetes mellitus with diabetic chronic kidney disease: Secondary | ICD-10-CM | POA: Diagnosis not present

## 2020-03-26 DIAGNOSIS — N183 Chronic kidney disease, stage 3 unspecified: Secondary | ICD-10-CM | POA: Diagnosis not present

## 2020-03-26 DIAGNOSIS — E538 Deficiency of other specified B group vitamins: Secondary | ICD-10-CM | POA: Diagnosis not present

## 2020-03-26 DIAGNOSIS — Z510 Encounter for antineoplastic radiation therapy: Secondary | ICD-10-CM | POA: Diagnosis not present

## 2020-03-26 DIAGNOSIS — Z0000 Encounter for general adult medical examination without abnormal findings: Secondary | ICD-10-CM | POA: Diagnosis not present

## 2020-03-26 DIAGNOSIS — D61818 Other pancytopenia: Secondary | ICD-10-CM | POA: Diagnosis not present

## 2020-03-26 DIAGNOSIS — C9200 Acute myeloblastic leukemia, not having achieved remission: Secondary | ICD-10-CM | POA: Diagnosis not present

## 2020-03-26 NOTE — Telephone Encounter (Signed)
Call from Dr Rubye Beach office at [facility] stating that patient will need lab and transfusion support and that he needs to have labs and possible transfusion this week. He asks that we contact the patient with appointments

## 2020-03-26 NOTE — Telephone Encounter (Signed)
Dr. Mike Gip saw this patient in the hospital. This is the note from [facility].   per [facility]'s note- Telephone Encounter - Elinor Dodge, RN - 03/26/2020 9:45 AM EDT Formatting of this note might be different from the original. As directed by Dr. Royce Macadamia, I have spoken with the patient's son and was able to verify the following:  Thomas Morton expressed a desire to receive his repeat lab and transfusion support visits locally at [facility] with Dr. Nolon Stalls.  I have contacted Dr. Lenna Sciara Cochran's office this morning, left a message to return my call if needed as it pertains to having Thomas Morton scheduled for repeat lab in transfusion support visits. Otherwise, the patient's son expressed verbal understanding and had no other questions or concerns. Should there be any updates to the patient's plan of care, an addendum will be made to this encounter. No other actions taken and this updates been shared with the Leukemia Care Team.  Electronically signed by Elinor Dodge, RN at 03/26/2020 9:50 AM EDT

## 2020-03-27 ENCOUNTER — Encounter: Payer: Self-pay | Admitting: Hematology and Oncology

## 2020-03-27 ENCOUNTER — Inpatient Hospital Stay: Payer: Medicare HMO | Admitting: Hematology and Oncology

## 2020-03-27 ENCOUNTER — Ambulatory Visit
Admission: RE | Admit: 2020-03-27 | Discharge: 2020-03-27 | Disposition: A | Discharge status: Home / Self Care | Payer: Medicare HMO | Source: Ambulatory Visit | Attending: Radiation Oncology | Admitting: Radiation Oncology

## 2020-03-27 ENCOUNTER — Inpatient Hospital Stay: Payer: Medicare HMO | Admitting: *Deleted

## 2020-03-27 ENCOUNTER — Other Ambulatory Visit: Payer: Self-pay

## 2020-03-27 ENCOUNTER — Ambulatory Visit: Payer: Medicare HMO | Admitting: Hematology and Oncology

## 2020-03-27 ENCOUNTER — Inpatient Hospital Stay: Payer: Medicare HMO

## 2020-03-27 VITALS — BP 110/52 | HR 77 | Temp 98.3°F | Resp 18 | Wt 174.2 lb

## 2020-03-27 DIAGNOSIS — I6523 Occlusion and stenosis of bilateral carotid arteries: Secondary | ICD-10-CM | POA: Diagnosis not present

## 2020-03-27 DIAGNOSIS — C329 Malignant neoplasm of larynx, unspecified: Secondary | ICD-10-CM

## 2020-03-27 DIAGNOSIS — J189 Pneumonia, unspecified organism: Secondary | ICD-10-CM | POA: Diagnosis not present

## 2020-03-27 DIAGNOSIS — I480 Paroxysmal atrial fibrillation: Secondary | ICD-10-CM | POA: Diagnosis not present

## 2020-03-27 DIAGNOSIS — E1151 Type 2 diabetes mellitus with diabetic peripheral angiopathy without gangrene: Secondary | ICD-10-CM | POA: Diagnosis not present

## 2020-03-27 DIAGNOSIS — Z7189 Other specified counseling: Secondary | ICD-10-CM | POA: Diagnosis not present

## 2020-03-27 DIAGNOSIS — C9200 Acute myeloblastic leukemia, not having achieved remission: Secondary | ICD-10-CM

## 2020-03-27 DIAGNOSIS — Z510 Encounter for antineoplastic radiation therapy: Secondary | ICD-10-CM | POA: Diagnosis not present

## 2020-03-27 DIAGNOSIS — J690 Pneumonitis due to inhalation of food and vomit: Secondary | ICD-10-CM

## 2020-03-27 DIAGNOSIS — Z95810 Presence of automatic (implantable) cardiac defibrillator: Secondary | ICD-10-CM | POA: Insufficient documentation

## 2020-03-27 DIAGNOSIS — I700 Atherosclerosis of aorta: Secondary | ICD-10-CM | POA: Diagnosis not present

## 2020-03-27 DIAGNOSIS — Z87891 Personal history of nicotine dependence: Secondary | ICD-10-CM | POA: Diagnosis not present

## 2020-03-27 DIAGNOSIS — R490 Dysphonia: Secondary | ICD-10-CM | POA: Diagnosis not present

## 2020-03-27 DIAGNOSIS — R21 Rash and other nonspecific skin eruption: Secondary | ICD-10-CM | POA: Diagnosis not present

## 2020-03-27 DIAGNOSIS — I110 Hypertensive heart disease with heart failure: Secondary | ICD-10-CM | POA: Diagnosis not present

## 2020-03-27 DIAGNOSIS — Z66 Do not resuscitate: Secondary | ICD-10-CM | POA: Diagnosis not present

## 2020-03-27 DIAGNOSIS — I5022 Chronic systolic (congestive) heart failure: Secondary | ICD-10-CM | POA: Diagnosis not present

## 2020-03-27 DIAGNOSIS — E785 Hyperlipidemia, unspecified: Secondary | ICD-10-CM | POA: Diagnosis not present

## 2020-03-27 DIAGNOSIS — M1A9XX0 Chronic gout, unspecified, without tophus (tophi): Secondary | ICD-10-CM | POA: Diagnosis not present

## 2020-03-27 NOTE — Progress Notes (Signed)
Patient reports urinary frequency and incontinence, SOB, cough. Son is concerned about his BP running low

## 2020-03-27 NOTE — Progress Notes (Signed)
[name]  [address], [address] [place], [place] [address] Phone: [phone]  Fax: [phone]   Clinic Day:  03/27/2020  Referring physician: Miller, Mark F, MD  Chief Complaint: Thomas Morton is a 87 y.o. 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  HPI: The patient was admitted to [facility] from 03/11/2020 - 03/14/2020 for severe sepsis with septic shock secondary to right upper lobe pneumonia. He was started on IV Zosyn. Hematocrit was 26.7, hemoglobin 9.0, MCV 113.6, platelets 75,000, WBC 1,100 (ANC 300). Flow cytometry revealed acute leukemia (blasts 24%). He received 1 unit PRBCs on 03/14/2020. He was transferred to [facility] with concern for acute leukemia.   The patient was admitted to [facility] from 03/14/2020 - 03/18/2020. He improved with IV fluids, IV Zosyn, and Linezolid. IV Zosyn was transitioned to Cefdinir on 03/16/2020, to be completed on 03/29/2020. 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 Patient declined a bone marrow. Discussed treatment options; patient and family decided to proceed with best supportive care with antibiotics and blood/platelet transfusions as needed. Labs on discharge showed a hematocrit of 24.1, hemoglobin 8.7, MCV 101.4, platelets 39,000, WBC 800.  The patient is currently receiving radiation treatment with Dr. Chrystal. He received daily treatment from 02/28/2020 - 03/08/2020. Treatment was held while he was admitted to the hospital. He resumed treatment on 03/25/2020. He has received 10 treatments so far. He will be receiving 30 total treatments.  The patient saw Dr. Matthew Foster on 03/20/2020. He reported stable fatigue, hoarseness, and dyspnea since discharge. Hematocrit was 25.1, hemoglobin 9.0, MCV 101.1, platelets 37,000, WBC 1,100. The recommendation was to proceed with best supportive care for AML.  Discussed that if his symptoms improved dramatically with radiation, they could reconsider azacitidine.  The patient saw Dr. Mark Miller on 03/26/2020 for a hospital follow-up. Hematocrit was 25.4, hemoglobin 8.5, MCV 105.4, platelets 41,000, WBC 1,700 (ANC 80). He had a rash due to his antibiotics and was going to start using thick lotion. They discussed the importance of nutrition and maintaining his strength. He was prescribed Lasix prn.  Myeloid mutation panel on 03/15/2020 revealed variants of known or likely clinical significance in ASXL1, BCOR, NRAS and RUNX1. In addition, variants of unknown clinical significance in RUNX1 and TET2 were detected.   Symptomatically, he feels "pretty good." He is a little bit short of breath and is weaker than normal. His voice is still hoarse. His left ankle is swollen. He denies trouble swallowing, chest pain, nausea, vomiting, diarrhea, numbness, urinary symptoms, and balance and coordination problems. He does not have any pain right now. His neck is not sore from radiation.  The patient declines a bone marrow because he is not interested in chemotherapy at this time. He has seen physical therapy. Home health is coming soon.  He is on fluconazole 200 mg daily and valacyclovir 500 mg daily. He is taking allopurinol. He finishes cefdinir on 03/29/2020.  The patient's family brings him meals but he is able to perform all other ADLs independently.  The patient has signed DNR/DNI papers. He has a living will. He does not have a MOST form.   Past Medical History:  Diagnosis Date  . Afib (HCC)   . Aortic atherosclerosis (HCC)   . Arthritis   . B12 deficiency   . Bilateral carotid artery stenosis   . Cancer (HCC)    prostate 2001  .   Cardiac defibrillator in situ   . CHF (congestive heart failure) ([place])   . Chronic gouty arthritis   . Diabetes mellitus without complication ([place])   . Diabetic sensorimotor neuropathy ([place])   . Emphysema lung ([place])   .  GERD (gastroesophageal reflux disease)   . History of cardioembolic cerebrovascular accident (CVA)   . History of DVT (deep vein thrombosis)   . History of kidney stones    H/O  . History of prostatectomy   . Hyperlipidemia   . LBBB (left bundle branch block)   . NICM (nonischemic cardiomyopathy) [facility])     Past Surgical History:  Procedure Laterality Date  . CARDIAC DEFIBRILLATOR PLACEMENT    . DIRECT LARYNGOSCOPY N/A 01/31/2020   Procedure: MICRO DIRECT LARYNGOSCOPYWITH BIOPSY;  Surgeon: Clyde Canterbury, MD;  Location: [facility] ORS;  Service: ENT;  Laterality: N/A;  . eyelid surgery    . KNEE ARTHROSCOPY Bilateral   . PROSTATECTOMY      Family History  Problem Relation Age of Onset  . Throat cancer Mother   . Diabetes Father     Social History:  reports that he quit smoking about 52 years ago. His smoking use included cigarettes. He has a 30.00 pack-year smoking history. He has never used smokeless tobacco. He reports current alcohol use. He reports that he does not use drugs.  The patient denies any exposure to radiation or toxins.  The patient lives in [place] independently.  Patient's two sons check on him regularly.  He is accompanied by his son Barbaraann Rondo who is medical power of attorney.  Allergies:  Allergies  Allergen Reactions  . Sulfa Antibiotics Rash and Shortness Of Breath    Per patient low doses do not cause issues, only higher doses Pt reports can tolerate low doses of sulfa drugs Pt reports can tolerate low doses of sulfa drugs Per patient low doses do not cause issues, only higher doses   . Levofloxacin Other (See Comments)    Low Blood sugar, Syncope    Current Medications: Current Outpatient Medications  Medication Sig Dispense Refill  . acetaminophen (TYLENOL) 650 MG suppository Place 1 suppository (650 mg total) rectally every 6 (six) hours as needed for mild pain or fever. 12 suppository 0  . albuterol (PROVENTIL) (2.5 MG/3ML) 0.083% nebulizer solution  Take 3 mLs (2.5 mg total) by nebulization every 4 (four) hours as needed for wheezing or shortness of breath. 75 mL 12  . allopurinol (ZYLOPRIM) 300 MG tablet Take 300 mg by mouth at bedtime.    . Carboxymethylcellulose Sodium 0.25 % SOLN Administer 1 drop to both eyes Three (3) times a day.    . carvedilol (COREG) 3.125 MG tablet     . cefdinir (OMNICEF) 300 MG capsule Take by mouth.    . feeding supplement (ENSURE ENLIVE / ENSURE PLUS) LIQD Take 237 mLs by mouth 2 (two) times daily between meals. 237 mL 12  . fluconazole (DIFLUCAN) 200 MG tablet Take by mouth.    . furosemide (LASIX) 20 MG tablet Take by mouth.    . valACYclovir (VALTREX) 500 MG tablet Take by mouth.    Marland Kitchen aspirin EC 325 MG EC tablet Take 1 tablet (325 mg total) by mouth daily. (Patient not taking: Reported on 03/27/2020) 30 tablet 0  . atorvastatin (LIPITOR) 10 MG tablet Take 10 mg by mouth daily. (Patient not taking: Reported on 03/27/2020)    . chlorhexidine (PERIDEX) 0.12 % solution 15 mLs by Mouth Rinse route 2 (two) times daily. (Patient  not taking: Reported on 03/27/2020) 120 mL 0  . levofloxacin (LEVAQUIN) 250 MG tablet Take 500 mg (2 tablets) on the first day then 250 mg (1 tablet) every 24 hours. 31 tablet 1   No current facility-administered medications for this visit.    Review of Systems  Constitutional: Negative for chills, diaphoresis, fever, malaise/fatigue and weight loss.       Feels "pretty good."  HENT: Negative for congestion, ear discharge, ear pain, hearing loss, nosebleeds, sinus pain, sore throat and tinnitus.        Hoarse.  Eyes: Negative for blurred vision.  Respiratory: Positive for shortness of breath (mild). Negative for cough, hemoptysis and sputum production.   Cardiovascular: Negative for chest pain, palpitations and leg swelling.  Gastrointestinal: Negative for abdominal pain, blood in stool, constipation, diarrhea, heartburn, melena, nausea and vomiting.  Genitourinary: Negative for  dysuria, frequency, hematuria and urgency.  Musculoskeletal: Negative for back pain, joint pain, myalgias and neck pain.  Skin: Negative for itching and rash.  Neurological: Positive for weakness. Negative for dizziness, tingling, sensory change and headaches.  Endo/Heme/Allergies: Does not bruise/bleed easily.  Psychiatric/Behavioral: Negative for depression and memory loss. The patient is not nervous/anxious and does not have insomnia.   All other systems reviewed and are negative.  Performance status (ECOG): 1  Vitals Blood pressure (!) 110/52, pulse 77, temperature 98.3 F (36.8 C), temperature source Tympanic, resp. rate 18, weight 174 lb 2.6 oz (79 kg), SpO2 96 %.   Physical Exam Vitals and nursing note reviewed.  Constitutional:      General: He is not in acute distress.    Appearance: He is not diaphoretic.  HENT:     Head: Normocephalic and atraumatic.     Comments: Blue cap.    Mouth/Throat:     Mouth: Mucous membranes are moist.     Pharynx: Oropharynx is clear.     Comments: Hoarse. Eyes:     General: No scleral icterus.    Extraocular Movements: Extraocular movements intact.     Conjunctiva/sclera: Conjunctivae normal.     Pupils: Pupils are equal, round, and reactive to light.     Comments: Glasses.  Cardiovascular:     Rate and Rhythm: Normal rate and regular rhythm.     Heart sounds: Normal heart sounds. No murmur heard.   Pulmonary:     Effort: Pulmonary effort is normal. No respiratory distress.     Breath sounds: Normal breath sounds. No wheezing or rales.  Chest:     Chest wall: No tenderness.  Breasts:     Right: No axillary adenopathy or supraclavicular adenopathy.     Left: No axillary adenopathy or supraclavicular adenopathy.    Abdominal:     General: Bowel sounds are normal. There is no distension.     Palpations: Abdomen is soft. There is no mass.     Tenderness: There is no abdominal tenderness. There is no guarding or rebound.   Musculoskeletal:        General: No swelling or tenderness. Normal range of motion.     Cervical back: Normal range of motion and neck supple.     Left lower leg: Edema (ankle) present.  Lymphadenopathy:     Head:     Right side of head: No preauricular, posterior auricular or occipital adenopathy.     Left side of head: No preauricular, posterior auricular or occipital adenopathy.     Cervical: No cervical adenopathy.     Upper Body:       Right upper body: No supraclavicular or axillary adenopathy.     Left upper body: No supraclavicular or axillary adenopathy.     Lower Body: No right inguinal adenopathy. No left inguinal adenopathy.  Skin:    General: Skin is warm and dry.  Neurological:     Mental Status: He is alert and oriented to person, place, and time.  Psychiatric:        Behavior: Behavior normal.        Thought Content: Thought content normal.        Judgment: Judgment normal.    No visits with results within 3 Day(s) from this visit.  Latest known visit with results is:  No results displayed because visit has over 200 results.      Assessment:  Thomas Morton is a 87 y.o. 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  He presented with RUL pneumoniaand pancytopenia.Flow cytometry on 03/14/2020 revealed acute leukemia (blasts 24%).   Myeloid mutation panel on 03/15/2020 revealed variants of known or likely clinical significance in ASXL1, BCOR, NRAS and RUNX1. In addition, variants of unknown clinical significance in RUNX1 and TET2 were detected.   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  He has received radiation from 02/28/2020 - 03/08/2020 (10 of 30 fractions).  Radiation continues.  He has B12 deficiency.He is on supplementation.  He was admitted to [facility] from 03/11/2020 - 03/14/2020 for severe sepsis with septic shock secondary to right upper lobe pneumonia. He was treated with antibiotics.  He  was diagnosed with AML.  He was admitted to [facility] from 03/14/2020 - 03/18/2020. Patient and family decided to proceed with best supportive care with antibiotics and blood/platelet transfusions as needed.   Symptomatically, he is fatigued.  He has shortness of breath.  He denies any fevers.  He denies any bleeding.  Plan: 1.   Review labs from [facility]. 2.   Stage II squamous cell carcinoma of the larynx Patient wishes to continue radiation. 3.Acute myelogenous leukemia (AML)  He declined bone marrow aspirate and biopsy.  He is interested in supportive care alone.  Discuss plan for blood product support and antibiotics.   Blood products are leukopoor and irradiated.   Transition cefdinir to levofloxacin after completion of antibiotic course for pneumonia.   Continue fluconazole and acyclovir.             Continue neutropenic precautions.  Multiple questions asked and answered. 4.RUL pneumonia Patient completing course of cefdinir. 5.   RN:  MOST form. 6.   RTC on 03/29/2020 in [place] for labs (CBC with diff, hold tube). 7.   RTC on Monday and Thursdays x 4 weeks in [place] for labs (CBC with diff, hold tube) and +/- transfusion the following day (Tuesday in [facility], Fridays in [place]). 8.   RTC in 1 month (Monday) for MD assessment, labs (CBC with diff, BMP, hold tube) and +/- transfusion the following day in [place].  I discussed the assessment and treatment plan with the patient.  The patient was provided an opportunity to ask questions and all were answered.  The patient agreed with the plan and demonstrated an understanding of the instructions.  The patient was advised to call back if the symptoms worsen or if the condition fails to improve as anticipated.  I provided 25 minutes of face-to-face time during this this encounter and > 50% was spent counseling as documented under my assessment and plan. An additional 15  minutes were spent reviewing his chart  ([place]) including notes, labs, and imaging studies and discussing his care with Dr Royce Macadamia.    Kenisha Lynds C. Mike Gip, MD, PhD    03/27/2020, 9:09 AM  I, Mirian Mo Tufford, am acting as Education administrator for [employer]. Mike Gip, MD, PhD.  I, Bree Heinzelman C. Mike Gip, MD, have reviewed the above documentation for accuracy and completeness, and I agree with the above.

## 2020-03-27 NOTE — Telephone Encounter (Signed)
03/27/2020 Spoke w/ pts son and informed him Dr. Loletha Grayer wanted to see him today, appt set for 3:00 SRW

## 2020-03-28 ENCOUNTER — Encounter: Payer: Self-pay | Admitting: Hematology and Oncology

## 2020-03-28 ENCOUNTER — Ambulatory Visit
Admission: RE | Admit: 2020-03-28 | Discharge: 2020-03-28 | Disposition: A | Discharge status: Home / Self Care | Payer: Medicare HMO | Source: Ambulatory Visit | Attending: Radiation Oncology | Admitting: Radiation Oncology

## 2020-03-28 ENCOUNTER — Telehealth: Payer: Self-pay | Admitting: Hematology and Oncology

## 2020-03-28 ENCOUNTER — Other Ambulatory Visit: Payer: Self-pay | Admitting: Hematology and Oncology

## 2020-03-28 DIAGNOSIS — C9200 Acute myeloblastic leukemia, not having achieved remission: Secondary | ICD-10-CM | POA: Diagnosis not present

## 2020-03-28 DIAGNOSIS — I5022 Chronic systolic (congestive) heart failure: Secondary | ICD-10-CM | POA: Diagnosis not present

## 2020-03-28 DIAGNOSIS — I110 Hypertensive heart disease with heart failure: Secondary | ICD-10-CM | POA: Diagnosis not present

## 2020-03-28 DIAGNOSIS — Z510 Encounter for antineoplastic radiation therapy: Secondary | ICD-10-CM | POA: Diagnosis not present

## 2020-03-28 DIAGNOSIS — I6523 Occlusion and stenosis of bilateral carotid arteries: Secondary | ICD-10-CM | POA: Diagnosis not present

## 2020-03-28 DIAGNOSIS — C329 Malignant neoplasm of larynx, unspecified: Secondary | ICD-10-CM | POA: Diagnosis not present

## 2020-03-28 DIAGNOSIS — E785 Hyperlipidemia, unspecified: Secondary | ICD-10-CM | POA: Diagnosis not present

## 2020-03-28 DIAGNOSIS — I700 Atherosclerosis of aorta: Secondary | ICD-10-CM | POA: Diagnosis not present

## 2020-03-28 DIAGNOSIS — I480 Paroxysmal atrial fibrillation: Secondary | ICD-10-CM | POA: Diagnosis not present

## 2020-03-28 DIAGNOSIS — M1A9XX0 Chronic gout, unspecified, without tophus (tophi): Secondary | ICD-10-CM | POA: Diagnosis not present

## 2020-03-28 DIAGNOSIS — E1151 Type 2 diabetes mellitus with diabetic peripheral angiopathy without gangrene: Secondary | ICD-10-CM | POA: Diagnosis not present

## 2020-03-28 MED ORDER — LEVOFLOXACIN 250 MG PO TABS: ORAL_TABLET | ORAL | 1 refills | Status: DC

## 2020-03-28 NOTE — Progress Notes (Signed)
Multidisciplinary Oncology Council Documentation  GABRIAL DOMINE was presented by our [facility] on 03/27/2020, which included representatives from:  . Palliative Care . Dietitian  . Physical/Occupational Therapist . Nurse Navigator . Genetics . Speech Therapist . Social work . Survivorship RN . Hotel manager . Research RN . [place] Representative  [place] currently presents with history of laryngeal cancer.  We reviewed previous medical and familial history, history of present illness, and recent lab results along with all available histopathologic and imaging studies. The [place] considered available treatment options and made the following recommendations/referrals:  - Speech therapy referral - Dietitian referral  The MOC is a meeting of clinicians from various specialty areas who evaluate and discuss patients for whom a multidisciplinary approach is being considered. Final determinations in the plan of care are those of the provider(s).   Today's extended care, comprehensive team conference, Johnta was not present for the discussion and was not examined.

## 2020-03-28 NOTE — Telephone Encounter (Signed)
Re:  Questionable levofloxacin allergy  Called patient and his son to discuss his possible Levaquin allergy.  Patient stated that he was only allergic to sulfa.  I reviewed our records of a notation for Levaquin causing low blood sugar and syncope.  The patient is not aware.  I review his old medications.  He was on Levaquin during a hospitalization in 12/2017 for pneumonia.  He tolerated Levaquin well and was sent home on Levaquin.  Clinic notes do not indicate any issues with Levaquin.  The patient's son felt comfortable with me sending in the Rx for Levaquin.  His CrCl is 42.2 ml/min (38.4 ml/min).  Per Upto[employer], if Cr 20 - < 50 ml/min, Levaquin 500 mg initial dose then 250 mg every 24 hours.   Lequita Asal, MD

## 2020-03-29 ENCOUNTER — Telehealth: Payer: Self-pay | Admitting: *Deleted

## 2020-03-29 ENCOUNTER — Ambulatory Visit
Admission: RE | Admit: 2020-03-29 | Discharge: 2020-03-29 | Disposition: A | Discharge status: Home / Self Care | Payer: Medicare HMO | Source: Ambulatory Visit | Attending: Radiation Oncology | Admitting: Radiation Oncology

## 2020-03-29 ENCOUNTER — Inpatient Hospital Stay: Payer: Medicare HMO

## 2020-03-29 ENCOUNTER — Other Ambulatory Visit: Payer: Self-pay

## 2020-03-29 DIAGNOSIS — Z87891 Personal history of nicotine dependence: Secondary | ICD-10-CM | POA: Diagnosis not present

## 2020-03-29 DIAGNOSIS — R490 Dysphonia: Secondary | ICD-10-CM | POA: Diagnosis not present

## 2020-03-29 DIAGNOSIS — J189 Pneumonia, unspecified organism: Secondary | ICD-10-CM | POA: Diagnosis not present

## 2020-03-29 DIAGNOSIS — R21 Rash and other nonspecific skin eruption: Secondary | ICD-10-CM | POA: Diagnosis not present

## 2020-03-29 DIAGNOSIS — C329 Malignant neoplasm of larynx, unspecified: Secondary | ICD-10-CM | POA: Diagnosis not present

## 2020-03-29 DIAGNOSIS — Z510 Encounter for antineoplastic radiation therapy: Secondary | ICD-10-CM | POA: Diagnosis not present

## 2020-03-29 DIAGNOSIS — C9200 Acute myeloblastic leukemia, not having achieved remission: Secondary | ICD-10-CM | POA: Diagnosis not present

## 2020-03-29 DIAGNOSIS — Z66 Do not resuscitate: Secondary | ICD-10-CM | POA: Diagnosis not present

## 2020-03-29 LAB — CBC WITH DIFFERENTIAL/PLATELET
Abs Immature Granulocytes: 0.02 10*3/uL (ref 0.00–0.07)
Basophils Absolute: 0 10*3/uL (ref 0.0–0.1)
Basophils Relative: 0 %
Eosinophils Absolute: 0 10*3/uL (ref 0.0–0.5)
Eosinophils Relative: 0 %
HCT: 24 % — ABNORMAL LOW (ref 39.0–52.0)
Hemoglobin: 8.1 g/dL — ABNORMAL LOW (ref 13.0–17.0)
Immature Granulocytes: 1 %
Lymphocytes Relative: 65 %
Lymphs Abs: 1.1 10*3/uL (ref 0.7–4.0)
MCH: 35.1 pg — ABNORMAL HIGH (ref 26.0–34.0)
MCHC: 33.8 g/dL (ref 30.0–36.0)
MCV: 103.9 fL — ABNORMAL HIGH (ref 80.0–100.0)
Monocytes Absolute: 0.4 10*3/uL (ref 0.1–1.0)
Monocytes Relative: 22 %
Neutro Abs: 0.2 10*3/uL — CL (ref 1.7–7.7)
Neutrophils Relative %: 12 %
Platelets: 48 10*3/uL — ABNORMAL LOW (ref 150–400)
RBC: 2.31 MIL/uL — ABNORMAL LOW (ref 4.22–5.81)
RDW: 18 % — ABNORMAL HIGH (ref 11.5–15.5)
Smear Review: NORMAL
WBC: 1.7 10*3/uL — ABNORMAL LOW (ref 4.0–10.5)
nRBC: 0 % (ref 0.0–0.2)

## 2020-03-29 LAB — SAMPLE TO BLOOD BANK

## 2020-03-29 NOTE — Telephone Encounter (Signed)
Call placed to patients son to review lab results from today, Thomas Morton is 0.2 and Hgb 8.1. Reviewed neutropenic precautions and current plan with patients son Aaron Edelman. Hgb 8.1, patient to keep f/u for lab/possible transfusion next week.

## 2020-04-01 ENCOUNTER — Other Ambulatory Visit: Payer: Self-pay | Admitting: *Deleted

## 2020-04-01 ENCOUNTER — Ambulatory Visit: Admission: RE | Admit: 2020-04-01 | Payer: Medicare HMO | Source: Ambulatory Visit

## 2020-04-01 ENCOUNTER — Inpatient Hospital Stay: Payer: Medicare HMO

## 2020-04-01 ENCOUNTER — Telehealth: Payer: Self-pay

## 2020-04-01 ENCOUNTER — Telehealth: Payer: Self-pay | Admitting: Licensed Clinical Social Worker

## 2020-04-01 ENCOUNTER — Other Ambulatory Visit: Payer: Self-pay | Admitting: Hematology and Oncology

## 2020-04-01 DIAGNOSIS — R21 Rash and other nonspecific skin eruption: Secondary | ICD-10-CM | POA: Diagnosis not present

## 2020-04-01 DIAGNOSIS — D649 Anemia, unspecified: Secondary | ICD-10-CM

## 2020-04-01 DIAGNOSIS — R490 Dysphonia: Secondary | ICD-10-CM | POA: Diagnosis not present

## 2020-04-01 DIAGNOSIS — C9200 Acute myeloblastic leukemia, not having achieved remission: Secondary | ICD-10-CM | POA: Diagnosis not present

## 2020-04-01 DIAGNOSIS — C329 Malignant neoplasm of larynx, unspecified: Secondary | ICD-10-CM | POA: Diagnosis not present

## 2020-04-01 DIAGNOSIS — C9500 Acute leukemia of unspecified cell type not having achieved remission: Secondary | ICD-10-CM

## 2020-04-01 DIAGNOSIS — Z87891 Personal history of nicotine dependence: Secondary | ICD-10-CM | POA: Diagnosis not present

## 2020-04-01 DIAGNOSIS — Z66 Do not resuscitate: Secondary | ICD-10-CM | POA: Diagnosis not present

## 2020-04-01 DIAGNOSIS — J189 Pneumonia, unspecified organism: Secondary | ICD-10-CM | POA: Diagnosis not present

## 2020-04-01 LAB — CBC WITH DIFFERENTIAL/PLATELET
Abs Immature Granulocytes: 0 10*3/uL (ref 0.00–0.07)
Basophils Absolute: 0 10*3/uL (ref 0.0–0.1)
Basophils Relative: 0 %
Blasts: 5 %
Eosinophils Absolute: 0.1 10*3/uL (ref 0.0–0.5)
Eosinophils Relative: 5 %
HCT: 23.3 % — ABNORMAL LOW (ref 39.0–52.0)
Hemoglobin: 8 g/dL — ABNORMAL LOW (ref 13.0–17.0)
Lymphocytes Relative: 78 %
Lymphs Abs: 1.3 10*3/uL (ref 0.7–4.0)
MCH: 36 pg — ABNORMAL HIGH (ref 26.0–34.0)
MCHC: 34.3 g/dL (ref 30.0–36.0)
MCV: 105 fL — ABNORMAL HIGH (ref 80.0–100.0)
Monocytes Absolute: 0.1 10*3/uL (ref 0.1–1.0)
Monocytes Relative: 5 %
Neutro Abs: 0.1 10*3/uL — CL (ref 1.7–7.7)
Neutrophils Relative %: 7 %
Platelets: 50 10*3/uL — ABNORMAL LOW (ref 150–400)
RBC: 2.22 MIL/uL — ABNORMAL LOW (ref 4.22–5.81)
RDW: 18 % — ABNORMAL HIGH (ref 11.5–15.5)
Smear Review: NORMAL
WBC: 1.7 10*3/uL — ABNORMAL LOW (ref 4.0–10.5)
nRBC: 0 % (ref 0.0–0.2)

## 2020-04-01 LAB — SAMPLE TO BLOOD BANK

## 2020-04-01 LAB — PREPARE RBC (CROSSMATCH)

## 2020-04-01 NOTE — Progress Notes (Signed)
Patient's son states patient is still short of breath and dizzy. He states he would benefit from a blood transfusion. Schedulers informed.

## 2020-04-01 NOTE — Telephone Encounter (Signed)
Patient was here for radiation but was too weak for treatment, he was interested in getting fluids due to symptoms of weakness, dizziness, and  low blood pressure. Availability for fluids were not available at the moment, and the patient decided to leave due to being uncomfortable while waiting. Son decided to take him home and try giving him some fluids. Son advised that he would call back to schedule fluids if oral fluids do not help.

## 2020-04-02 ENCOUNTER — Ambulatory Visit: Payer: Medicare HMO

## 2020-04-02 ENCOUNTER — Other Ambulatory Visit: Payer: Self-pay

## 2020-04-02 ENCOUNTER — Inpatient Hospital Stay: Payer: Medicare HMO

## 2020-04-02 DIAGNOSIS — Z87891 Personal history of nicotine dependence: Secondary | ICD-10-CM | POA: Diagnosis not present

## 2020-04-02 DIAGNOSIS — C9200 Acute myeloblastic leukemia, not having achieved remission: Secondary | ICD-10-CM | POA: Diagnosis not present

## 2020-04-02 DIAGNOSIS — R21 Rash and other nonspecific skin eruption: Secondary | ICD-10-CM | POA: Diagnosis not present

## 2020-04-02 DIAGNOSIS — R490 Dysphonia: Secondary | ICD-10-CM | POA: Diagnosis not present

## 2020-04-02 DIAGNOSIS — Z66 Do not resuscitate: Secondary | ICD-10-CM | POA: Diagnosis not present

## 2020-04-02 DIAGNOSIS — C329 Malignant neoplasm of larynx, unspecified: Secondary | ICD-10-CM | POA: Diagnosis not present

## 2020-04-02 DIAGNOSIS — J189 Pneumonia, unspecified organism: Secondary | ICD-10-CM | POA: Diagnosis not present

## 2020-04-02 DIAGNOSIS — D649 Anemia, unspecified: Secondary | ICD-10-CM

## 2020-04-02 DIAGNOSIS — C9500 Acute leukemia of unspecified cell type not having achieved remission: Secondary | ICD-10-CM

## 2020-04-02 MED ORDER — ACETAMINOPHEN 325 MG PO TABS
650.0000 mg | ORAL_TABLET | Freq: Once | ORAL | Status: AC
  Administered 2020-04-02: 650 mg via ORAL
  Filled 2020-04-02: qty 2

## 2020-04-02 MED ORDER — DIPHENHYDRAMINE HCL 25 MG PO CAPS
25.0000 mg | ORAL_CAPSULE | Freq: Once | ORAL | Status: AC
  Administered 2020-04-02: 25 mg via ORAL
  Filled 2020-04-02: qty 1

## 2020-04-02 MED ORDER — SODIUM CHLORIDE 0.9% IV SOLUTION
250.0000 mL | Freq: Once | INTRAVENOUS | Status: AC
  Administered 2020-04-02: 250 mL via INTRAVENOUS
  Filled 2020-04-02: qty 250

## 2020-04-03 ENCOUNTER — Inpatient Hospital Stay: Payer: Medicare HMO

## 2020-04-03 ENCOUNTER — Ambulatory Visit
Admission: RE | Admit: 2020-04-03 | Discharge: 2020-04-03 | Disposition: A | Discharge status: Home / Self Care | Payer: Medicare HMO | Source: Ambulatory Visit | Attending: Radiation Oncology | Admitting: Radiation Oncology

## 2020-04-03 DIAGNOSIS — C329 Malignant neoplasm of larynx, unspecified: Secondary | ICD-10-CM | POA: Diagnosis not present

## 2020-04-03 DIAGNOSIS — Z510 Encounter for antineoplastic radiation therapy: Secondary | ICD-10-CM | POA: Diagnosis not present

## 2020-04-03 LAB — BPAM RBC
Blood Product Expiration Date: 202204132359
ISSUE DATE / TIME: 202203221236
Unit Type and Rh: 7300

## 2020-04-03 LAB — TYPE AND SCREEN
ABO/RH(D): B POS
Antibody Screen: NEGATIVE
Unit division: 0

## 2020-04-04 ENCOUNTER — Inpatient Hospital Stay: Payer: Medicare HMO

## 2020-04-04 ENCOUNTER — Ambulatory Visit
Admission: RE | Admit: 2020-04-04 | Discharge: 2020-04-04 | Disposition: A | Discharge status: Home / Self Care | Payer: Medicare HMO | Source: Ambulatory Visit | Attending: Radiation Oncology | Admitting: Radiation Oncology

## 2020-04-04 DIAGNOSIS — J189 Pneumonia, unspecified organism: Secondary | ICD-10-CM | POA: Diagnosis not present

## 2020-04-04 DIAGNOSIS — R21 Rash and other nonspecific skin eruption: Secondary | ICD-10-CM | POA: Diagnosis not present

## 2020-04-04 DIAGNOSIS — I6523 Occlusion and stenosis of bilateral carotid arteries: Secondary | ICD-10-CM | POA: Diagnosis not present

## 2020-04-04 DIAGNOSIS — I5022 Chronic systolic (congestive) heart failure: Secondary | ICD-10-CM | POA: Diagnosis not present

## 2020-04-04 DIAGNOSIS — I480 Paroxysmal atrial fibrillation: Secondary | ICD-10-CM | POA: Diagnosis not present

## 2020-04-04 DIAGNOSIS — C329 Malignant neoplasm of larynx, unspecified: Secondary | ICD-10-CM

## 2020-04-04 DIAGNOSIS — Z66 Do not resuscitate: Secondary | ICD-10-CM | POA: Diagnosis not present

## 2020-04-04 DIAGNOSIS — E1151 Type 2 diabetes mellitus with diabetic peripheral angiopathy without gangrene: Secondary | ICD-10-CM | POA: Diagnosis not present

## 2020-04-04 DIAGNOSIS — E785 Hyperlipidemia, unspecified: Secondary | ICD-10-CM | POA: Diagnosis not present

## 2020-04-04 DIAGNOSIS — R490 Dysphonia: Secondary | ICD-10-CM | POA: Diagnosis not present

## 2020-04-04 DIAGNOSIS — C9200 Acute myeloblastic leukemia, not having achieved remission: Secondary | ICD-10-CM | POA: Diagnosis not present

## 2020-04-04 DIAGNOSIS — Z510 Encounter for antineoplastic radiation therapy: Secondary | ICD-10-CM | POA: Diagnosis not present

## 2020-04-04 DIAGNOSIS — I110 Hypertensive heart disease with heart failure: Secondary | ICD-10-CM | POA: Diagnosis not present

## 2020-04-04 DIAGNOSIS — Z87891 Personal history of nicotine dependence: Secondary | ICD-10-CM | POA: Diagnosis not present

## 2020-04-04 DIAGNOSIS — I700 Atherosclerosis of aorta: Secondary | ICD-10-CM | POA: Diagnosis not present

## 2020-04-04 DIAGNOSIS — M1A9XX0 Chronic gout, unspecified, without tophus (tophi): Secondary | ICD-10-CM | POA: Diagnosis not present

## 2020-04-04 LAB — SAMPLE TO BLOOD BANK

## 2020-04-04 LAB — CBC WITH DIFFERENTIAL/PLATELET
Abs Immature Granulocytes: 0.01 10*3/uL (ref 0.00–0.07)
Basophils Absolute: 0 10*3/uL (ref 0.0–0.1)
Basophils Relative: 0 %
Eosinophils Absolute: 0 10*3/uL (ref 0.0–0.5)
Eosinophils Relative: 0 %
HCT: 24.9 % — ABNORMAL LOW (ref 39.0–52.0)
Hemoglobin: 8.7 g/dL — ABNORMAL LOW (ref 13.0–17.0)
Immature Granulocytes: 1 %
Lymphocytes Relative: 58 %
Lymphs Abs: 0.8 10*3/uL (ref 0.7–4.0)
MCH: 34.7 pg — ABNORMAL HIGH (ref 26.0–34.0)
MCHC: 34.9 g/dL (ref 30.0–36.0)
MCV: 99.2 fL (ref 80.0–100.0)
Monocytes Absolute: 0.4 10*3/uL (ref 0.1–1.0)
Monocytes Relative: 27 %
Neutro Abs: 0.2 10*3/uL — CL (ref 1.7–7.7)
Neutrophils Relative %: 14 %
Platelets: 44 10*3/uL — ABNORMAL LOW (ref 150–400)
RBC: 2.51 MIL/uL — ABNORMAL LOW (ref 4.22–5.81)
RDW: 20 % — ABNORMAL HIGH (ref 11.5–15.5)
Smear Review: NORMAL
WBC Morphology: ABNORMAL
WBC: 1.4 10*3/uL — CL (ref 4.0–10.5)
nRBC: 0 % (ref 0.0–0.2)

## 2020-04-05 ENCOUNTER — Ambulatory Visit
Admission: RE | Admit: 2020-04-05 | Discharge: 2020-04-05 | Disposition: A | Discharge status: Home / Self Care | Payer: Medicare HMO | Source: Ambulatory Visit | Attending: Radiation Oncology | Admitting: Radiation Oncology

## 2020-04-05 ENCOUNTER — Inpatient Hospital Stay: Payer: Medicare HMO

## 2020-04-05 DIAGNOSIS — C9200 Acute myeloblastic leukemia, not having achieved remission: Secondary | ICD-10-CM | POA: Diagnosis not present

## 2020-04-05 DIAGNOSIS — J189 Pneumonia, unspecified organism: Secondary | ICD-10-CM | POA: Diagnosis not present

## 2020-04-05 DIAGNOSIS — Z87891 Personal history of nicotine dependence: Secondary | ICD-10-CM | POA: Diagnosis not present

## 2020-04-05 DIAGNOSIS — Z510 Encounter for antineoplastic radiation therapy: Secondary | ICD-10-CM | POA: Diagnosis not present

## 2020-04-05 DIAGNOSIS — Z66 Do not resuscitate: Secondary | ICD-10-CM | POA: Diagnosis not present

## 2020-04-05 DIAGNOSIS — C329 Malignant neoplasm of larynx, unspecified: Secondary | ICD-10-CM | POA: Diagnosis not present

## 2020-04-05 DIAGNOSIS — R490 Dysphonia: Secondary | ICD-10-CM | POA: Diagnosis not present

## 2020-04-05 DIAGNOSIS — R21 Rash and other nonspecific skin eruption: Secondary | ICD-10-CM | POA: Diagnosis not present

## 2020-04-08 ENCOUNTER — Other Ambulatory Visit: Payer: Self-pay

## 2020-04-08 ENCOUNTER — Inpatient Hospital Stay: Payer: Medicare HMO

## 2020-04-08 ENCOUNTER — Ambulatory Visit
Admission: RE | Admit: 2020-04-08 | Discharge: 2020-04-08 | Disposition: A | Discharge status: Home / Self Care | Payer: Medicare HMO | Source: Ambulatory Visit | Attending: Radiation Oncology | Admitting: Radiation Oncology

## 2020-04-08 DIAGNOSIS — C329 Malignant neoplasm of larynx, unspecified: Secondary | ICD-10-CM | POA: Diagnosis not present

## 2020-04-08 DIAGNOSIS — C9200 Acute myeloblastic leukemia, not having achieved remission: Secondary | ICD-10-CM | POA: Diagnosis not present

## 2020-04-08 DIAGNOSIS — Z510 Encounter for antineoplastic radiation therapy: Secondary | ICD-10-CM | POA: Diagnosis not present

## 2020-04-08 DIAGNOSIS — J189 Pneumonia, unspecified organism: Secondary | ICD-10-CM | POA: Diagnosis not present

## 2020-04-08 DIAGNOSIS — R21 Rash and other nonspecific skin eruption: Secondary | ICD-10-CM | POA: Diagnosis not present

## 2020-04-08 DIAGNOSIS — Z66 Do not resuscitate: Secondary | ICD-10-CM | POA: Diagnosis not present

## 2020-04-08 DIAGNOSIS — R490 Dysphonia: Secondary | ICD-10-CM | POA: Diagnosis not present

## 2020-04-08 DIAGNOSIS — Z87891 Personal history of nicotine dependence: Secondary | ICD-10-CM | POA: Diagnosis not present

## 2020-04-08 LAB — CBC WITH DIFFERENTIAL/PLATELET
Abs Immature Granulocytes: 0 10*3/uL (ref 0.00–0.07)
Basophils Absolute: 0 10*3/uL (ref 0.0–0.1)
Basophils Relative: 0 %
Eosinophils Absolute: 0 10*3/uL (ref 0.0–0.5)
Eosinophils Relative: 1 %
HCT: 24.4 % — ABNORMAL LOW (ref 39.0–52.0)
Hemoglobin: 8.5 g/dL — ABNORMAL LOW (ref 13.0–17.0)
Immature Granulocytes: 0 %
Lymphocytes Relative: 51 %
Lymphs Abs: 0.7 10*3/uL (ref 0.7–4.0)
MCH: 34.7 pg — ABNORMAL HIGH (ref 26.0–34.0)
MCHC: 34.8 g/dL (ref 30.0–36.0)
MCV: 99.6 fL (ref 80.0–100.0)
Monocytes Absolute: 0.5 10*3/uL (ref 0.1–1.0)
Monocytes Relative: 34 %
Neutro Abs: 0.2 10*3/uL — CL (ref 1.7–7.7)
Neutrophils Relative %: 14 %
Platelets: 38 10*3/uL — ABNORMAL LOW (ref 150–400)
RBC: 2.45 MIL/uL — ABNORMAL LOW (ref 4.22–5.81)
RDW: 19.3 % — ABNORMAL HIGH (ref 11.5–15.5)
Smear Review: NORMAL
WBC: 1.4 10*3/uL — CL (ref 4.0–10.5)
nRBC: 0 % (ref 0.0–0.2)

## 2020-04-08 LAB — SAMPLE TO BLOOD BANK

## 2020-04-09 ENCOUNTER — Telehealth: Payer: Self-pay | Admitting: *Deleted

## 2020-04-09 ENCOUNTER — Ambulatory Visit
Admission: RE | Admit: 2020-04-09 | Discharge: 2020-04-09 | Disposition: A | Discharge status: Home / Self Care | Payer: Medicare HMO | Source: Ambulatory Visit | Attending: Radiation Oncology | Admitting: Radiation Oncology

## 2020-04-09 ENCOUNTER — Inpatient Hospital Stay: Payer: Medicare HMO

## 2020-04-09 DIAGNOSIS — C329 Malignant neoplasm of larynx, unspecified: Secondary | ICD-10-CM | POA: Diagnosis not present

## 2020-04-09 DIAGNOSIS — Z510 Encounter for antineoplastic radiation therapy: Secondary | ICD-10-CM | POA: Diagnosis not present

## 2020-04-09 NOTE — Telephone Encounter (Signed)
Left vm that blood transfusion is not needed.  Contacted Ana/Tamika in radiation. Patient aware not to come today for blood transfusion.

## 2020-04-10 ENCOUNTER — Ambulatory Visit
Admission: RE | Admit: 2020-04-10 | Discharge: 2020-04-10 | Disposition: A | Discharge status: Home / Self Care | Payer: Medicare HMO | Source: Ambulatory Visit | Attending: Radiation Oncology | Admitting: Radiation Oncology

## 2020-04-10 DIAGNOSIS — J189 Pneumonia, unspecified organism: Secondary | ICD-10-CM | POA: Diagnosis not present

## 2020-04-10 DIAGNOSIS — I480 Paroxysmal atrial fibrillation: Secondary | ICD-10-CM | POA: Diagnosis not present

## 2020-04-10 DIAGNOSIS — E870 Hyperosmolality and hypernatremia: Secondary | ICD-10-CM | POA: Diagnosis present

## 2020-04-10 DIAGNOSIS — E1151 Type 2 diabetes mellitus with diabetic peripheral angiopathy without gangrene: Secondary | ICD-10-CM | POA: Diagnosis not present

## 2020-04-10 DIAGNOSIS — J690 Pneumonitis due to inhalation of food and vomit: Secondary | ICD-10-CM | POA: Diagnosis present

## 2020-04-10 DIAGNOSIS — I428 Other cardiomyopathies: Secondary | ICD-10-CM | POA: Diagnosis present

## 2020-04-10 DIAGNOSIS — A419 Sepsis, unspecified organism: Secondary | ICD-10-CM | POA: Diagnosis present

## 2020-04-10 DIAGNOSIS — I447 Left bundle-branch block, unspecified: Secondary | ICD-10-CM | POA: Diagnosis present

## 2020-04-10 DIAGNOSIS — M1A9XX0 Chronic gout, unspecified, without tophus (tophi): Secondary | ICD-10-CM | POA: Diagnosis present

## 2020-04-10 DIAGNOSIS — I130 Hypertensive heart and chronic kidney disease with heart failure and stage 1 through stage 4 chronic kidney disease, or unspecified chronic kidney disease: Secondary | ICD-10-CM | POA: Diagnosis present

## 2020-04-10 DIAGNOSIS — Z515 Encounter for palliative care: Secondary | ICD-10-CM | POA: Diagnosis not present

## 2020-04-10 DIAGNOSIS — Z950 Presence of cardiac pacemaker: Secondary | ICD-10-CM | POA: Diagnosis not present

## 2020-04-10 DIAGNOSIS — N179 Acute kidney failure, unspecified: Secondary | ICD-10-CM | POA: Diagnosis present

## 2020-04-10 DIAGNOSIS — I5042 Chronic combined systolic (congestive) and diastolic (congestive) heart failure: Secondary | ICD-10-CM | POA: Diagnosis present

## 2020-04-10 DIAGNOSIS — D61818 Other pancytopenia: Secondary | ICD-10-CM | POA: Diagnosis present

## 2020-04-10 DIAGNOSIS — I5022 Chronic systolic (congestive) heart failure: Secondary | ICD-10-CM | POA: Diagnosis not present

## 2020-04-10 DIAGNOSIS — R0602 Shortness of breath: Secondary | ICD-10-CM | POA: Diagnosis not present

## 2020-04-10 DIAGNOSIS — J90 Pleural effusion, not elsewhere classified: Secondary | ICD-10-CM | POA: Diagnosis not present

## 2020-04-10 DIAGNOSIS — E538 Deficiency of other specified B group vitamins: Secondary | ICD-10-CM | POA: Diagnosis present

## 2020-04-10 DIAGNOSIS — D849 Immunodeficiency, unspecified: Secondary | ICD-10-CM | POA: Diagnosis present

## 2020-04-10 DIAGNOSIS — C329 Malignant neoplasm of larynx, unspecified: Secondary | ICD-10-CM | POA: Diagnosis present

## 2020-04-10 DIAGNOSIS — N189 Chronic kidney disease, unspecified: Secondary | ICD-10-CM | POA: Diagnosis not present

## 2020-04-10 DIAGNOSIS — C9290 Myeloid leukemia, unspecified, not having achieved remission: Secondary | ICD-10-CM | POA: Diagnosis not present

## 2020-04-10 DIAGNOSIS — J984 Other disorders of lung: Secondary | ICD-10-CM | POA: Diagnosis not present

## 2020-04-10 DIAGNOSIS — R21 Rash and other nonspecific skin eruption: Secondary | ICD-10-CM | POA: Diagnosis not present

## 2020-04-10 DIAGNOSIS — C9500 Acute leukemia of unspecified cell type not having achieved remission: Secondary | ICD-10-CM | POA: Diagnosis not present

## 2020-04-10 DIAGNOSIS — I700 Atherosclerosis of aorta: Secondary | ICD-10-CM | POA: Diagnosis present

## 2020-04-10 DIAGNOSIS — I110 Hypertensive heart disease with heart failure: Secondary | ICD-10-CM | POA: Diagnosis not present

## 2020-04-10 DIAGNOSIS — D708 Other neutropenia: Secondary | ICD-10-CM | POA: Diagnosis not present

## 2020-04-10 DIAGNOSIS — R5081 Fever presenting with conditions classified elsewhere: Secondary | ICD-10-CM | POA: Diagnosis not present

## 2020-04-10 DIAGNOSIS — Z66 Do not resuscitate: Secondary | ICD-10-CM | POA: Diagnosis present

## 2020-04-10 DIAGNOSIS — Z87891 Personal history of nicotine dependence: Secondary | ICD-10-CM | POA: Diagnosis not present

## 2020-04-10 DIAGNOSIS — R490 Dysphonia: Secondary | ICD-10-CM | POA: Diagnosis not present

## 2020-04-10 DIAGNOSIS — R918 Other nonspecific abnormal finding of lung field: Secondary | ICD-10-CM | POA: Diagnosis not present

## 2020-04-10 DIAGNOSIS — I6523 Occlusion and stenosis of bilateral carotid arteries: Secondary | ICD-10-CM | POA: Diagnosis not present

## 2020-04-10 DIAGNOSIS — J440 Chronic obstructive pulmonary disease with acute lower respiratory infection: Secondary | ICD-10-CM | POA: Diagnosis present

## 2020-04-10 DIAGNOSIS — R6520 Severe sepsis without septic shock: Secondary | ICD-10-CM | POA: Diagnosis present

## 2020-04-10 DIAGNOSIS — I10 Essential (primary) hypertension: Secondary | ICD-10-CM | POA: Diagnosis not present

## 2020-04-10 DIAGNOSIS — E785 Hyperlipidemia, unspecified: Secondary | ICD-10-CM | POA: Diagnosis not present

## 2020-04-10 DIAGNOSIS — R059 Cough, unspecified: Secondary | ICD-10-CM | POA: Diagnosis not present

## 2020-04-10 DIAGNOSIS — D709 Neutropenia, unspecified: Secondary | ICD-10-CM | POA: Diagnosis not present

## 2020-04-10 DIAGNOSIS — J9601 Acute respiratory failure with hypoxia: Secondary | ICD-10-CM | POA: Diagnosis not present

## 2020-04-10 DIAGNOSIS — C9200 Acute myeloblastic leukemia, not having achieved remission: Secondary | ICD-10-CM | POA: Diagnosis present

## 2020-04-10 DIAGNOSIS — G9341 Metabolic encephalopathy: Secondary | ICD-10-CM | POA: Diagnosis present

## 2020-04-10 DIAGNOSIS — J9690 Respiratory failure, unspecified, unspecified whether with hypoxia or hypercapnia: Secondary | ICD-10-CM | POA: Diagnosis not present

## 2020-04-10 DIAGNOSIS — I482 Chronic atrial fibrillation, unspecified: Secondary | ICD-10-CM | POA: Diagnosis present

## 2020-04-10 DIAGNOSIS — N1831 Chronic kidney disease, stage 3a: Secondary | ICD-10-CM | POA: Diagnosis present

## 2020-04-10 DIAGNOSIS — R509 Fever, unspecified: Secondary | ICD-10-CM | POA: Diagnosis not present

## 2020-04-10 DIAGNOSIS — R000 Tachycardia, unspecified: Secondary | ICD-10-CM | POA: Diagnosis not present

## 2020-04-10 DIAGNOSIS — Z20822 Contact with and (suspected) exposure to covid-19: Secondary | ICD-10-CM | POA: Diagnosis present

## 2020-04-11 ENCOUNTER — Inpatient Hospital Stay: Payer: Medicare HMO

## 2020-04-11 ENCOUNTER — Other Ambulatory Visit: Payer: Self-pay

## 2020-04-11 ENCOUNTER — Telehealth: Payer: Self-pay | Admitting: *Deleted

## 2020-04-11 ENCOUNTER — Other Ambulatory Visit: Payer: Self-pay | Admitting: Hematology and Oncology

## 2020-04-11 ENCOUNTER — Other Ambulatory Visit: Payer: Self-pay | Admitting: *Deleted

## 2020-04-11 ENCOUNTER — Ambulatory Visit
Admission: RE | Admit: 2020-04-11 | Discharge: 2020-04-11 | Disposition: A | Discharge status: Home / Self Care | Payer: Medicare HMO | Source: Ambulatory Visit | Attending: Radiation Oncology | Admitting: Radiation Oncology

## 2020-04-11 DIAGNOSIS — D649 Anemia, unspecified: Secondary | ICD-10-CM

## 2020-04-11 DIAGNOSIS — Z66 Do not resuscitate: Secondary | ICD-10-CM | POA: Diagnosis not present

## 2020-04-11 DIAGNOSIS — J189 Pneumonia, unspecified organism: Secondary | ICD-10-CM | POA: Diagnosis not present

## 2020-04-11 DIAGNOSIS — R490 Dysphonia: Secondary | ICD-10-CM | POA: Diagnosis not present

## 2020-04-11 DIAGNOSIS — C329 Malignant neoplasm of larynx, unspecified: Secondary | ICD-10-CM

## 2020-04-11 DIAGNOSIS — R21 Rash and other nonspecific skin eruption: Secondary | ICD-10-CM | POA: Diagnosis not present

## 2020-04-11 DIAGNOSIS — C9200 Acute myeloblastic leukemia, not having achieved remission: Secondary | ICD-10-CM | POA: Diagnosis not present

## 2020-04-11 DIAGNOSIS — Z87891 Personal history of nicotine dependence: Secondary | ICD-10-CM | POA: Diagnosis not present

## 2020-04-11 DIAGNOSIS — C9500 Acute leukemia of unspecified cell type not having achieved remission: Secondary | ICD-10-CM

## 2020-04-11 LAB — CBC WITH DIFFERENTIAL/PLATELET
Abs Immature Granulocytes: 0 10*3/uL (ref 0.00–0.07)
Basophils Absolute: 0 10*3/uL (ref 0.0–0.1)
Basophils Relative: 0 %
Blasts: 21 %
Eosinophils Absolute: 0 10*3/uL (ref 0.0–0.5)
Eosinophils Relative: 0 %
HCT: 23.8 % — ABNORMAL LOW (ref 39.0–52.0)
Hemoglobin: 8.1 g/dL — ABNORMAL LOW (ref 13.0–17.0)
Lymphocytes Relative: 56 %
Lymphs Abs: 1 10*3/uL (ref 0.7–4.0)
MCH: 34 pg (ref 26.0–34.0)
MCHC: 34 g/dL (ref 30.0–36.0)
MCV: 100 fL (ref 80.0–100.0)
Monocytes Absolute: 0.4 10*3/uL (ref 0.1–1.0)
Monocytes Relative: 20 %
Myelocytes: 1 %
Neutro Abs: 0 10*3/uL — CL (ref 1.7–7.7)
Neutrophils Relative %: 2 %
Platelets: 36 10*3/uL — ABNORMAL LOW (ref 150–400)
RBC: 2.38 MIL/uL — ABNORMAL LOW (ref 4.22–5.81)
RDW: 19.6 % — ABNORMAL HIGH (ref 11.5–15.5)
Smear Review: NORMAL
WBC Morphology: ABNORMAL
WBC: 1.8 10*3/uL — ABNORMAL LOW (ref 4.0–10.5)
nRBC: 0 % (ref 0.0–0.2)

## 2020-04-11 LAB — SAMPLE TO BLOOD BANK

## 2020-04-11 LAB — PREPARE RBC (CROSSMATCH)

## 2020-04-11 NOTE — Telephone Encounter (Signed)
-----   Message from Lequita Asal, MD sent at 04/11/2020 11:10 AM EDT ----- Regarding: Please notify patient of labs  Hemoglobin drifting down- 8.1.  Symptomatic?  Patient typically receives a transfusion if hemoglobin < 8.0.  M  ----- Message ----- From: Buel Ream, Lab In [place] Sent: 04/11/2020  11:03 AM EDT To: Lequita Asal, MD

## 2020-04-11 NOTE — Telephone Encounter (Signed)
vm detailed left for Niel Peretti to return my phone call regarding his dad's care.

## 2020-04-11 NOTE — Telephone Encounter (Signed)
Contacted patient. He is short of breath. I explained to him that his hgb blood counts have dropped slightly since the beginning of the week. He is down to receive 1 unit of blood tomorrow in the cancer center. He would like me to discuss his care with his son. Pt asked that I call back in 1 hour to speak to his son. His son is currently at a doctor's office.  I explained to patient that he would get the 1 unit of blood dafter his radiation treatment tomorrow. Pt still insisted that I discuss his care with his son first before adding him for a blood transfusion apt.

## 2020-04-11 NOTE — Telephone Encounter (Signed)
Patient's son returned my phone call. He would like his dad to proceed with 1 unit of blood tomorrow. Dr. Loletha Grayer notified and she will enter blood orders for the patient.

## 2020-04-12 ENCOUNTER — Other Ambulatory Visit: Payer: Self-pay

## 2020-04-12 ENCOUNTER — Inpatient Hospital Stay: Payer: Medicare HMO | Attending: Hematology and Oncology

## 2020-04-12 ENCOUNTER — Inpatient Hospital Stay (HOSPITAL_BASED_OUTPATIENT_CLINIC_OR_DEPARTMENT_OTHER): Payer: Medicare HMO | Admitting: Nurse Practitioner

## 2020-04-12 ENCOUNTER — Ambulatory Visit
Admission: RE | Admit: 2020-04-12 | Discharge: 2020-04-12 | Disposition: A | Discharge status: Home / Self Care | Payer: Medicare HMO | Source: Ambulatory Visit | Attending: Radiation Oncology | Admitting: Radiation Oncology

## 2020-04-12 ENCOUNTER — Ambulatory Visit: Payer: Medicare HMO

## 2020-04-12 DIAGNOSIS — C329 Malignant neoplasm of larynx, unspecified: Secondary | ICD-10-CM | POA: Insufficient documentation

## 2020-04-12 DIAGNOSIS — R509 Fever, unspecified: Secondary | ICD-10-CM

## 2020-04-12 DIAGNOSIS — C9500 Acute leukemia of unspecified cell type not having achieved remission: Secondary | ICD-10-CM

## 2020-04-12 DIAGNOSIS — Z510 Encounter for antineoplastic radiation therapy: Secondary | ICD-10-CM | POA: Insufficient documentation

## 2020-04-12 DIAGNOSIS — D708 Other neutropenia: Secondary | ICD-10-CM | POA: Diagnosis not present

## 2020-04-12 DIAGNOSIS — Z7189 Other specified counseling: Secondary | ICD-10-CM | POA: Insufficient documentation

## 2020-04-12 DIAGNOSIS — D649 Anemia, unspecified: Secondary | ICD-10-CM

## 2020-04-12 LAB — URINALYSIS, COMPLETE (UACMP) WITH MICROSCOPIC
Bilirubin Urine: NEGATIVE
Glucose, UA: 50 mg/dL — AB
Hgb urine dipstick: NEGATIVE
Ketones, ur: NEGATIVE mg/dL
Leukocytes,Ua: NEGATIVE
Nitrite: NEGATIVE
Protein, ur: NEGATIVE mg/dL
Specific Gravity, Urine: 1.024 (ref 1.005–1.030)
pH: 5 (ref 5.0–8.0)

## 2020-04-12 LAB — CBC WITH DIFFERENTIAL/PLATELET
Abs Immature Granulocytes: 0.06 10*3/uL (ref 0.00–0.07)
Basophils Absolute: 0 10*3/uL (ref 0.0–0.1)
Basophils Relative: 0 %
Eosinophils Absolute: 0.1 10*3/uL (ref 0.0–0.5)
Eosinophils Relative: 3 %
HCT: 22.4 % — ABNORMAL LOW (ref 39.0–52.0)
Hemoglobin: 7.4 g/dL — ABNORMAL LOW (ref 13.0–17.0)
Immature Granulocytes: 4 %
Lymphocytes Relative: 50 %
Lymphs Abs: 0.8 10*3/uL (ref 0.7–4.0)
MCH: 33.2 pg (ref 26.0–34.0)
MCHC: 33 g/dL (ref 30.0–36.0)
MCV: 100.4 fL — ABNORMAL HIGH (ref 80.0–100.0)
Monocytes Absolute: 0.6 10*3/uL (ref 0.1–1.0)
Monocytes Relative: 40 %
Neutro Abs: 0.1 10*3/uL — CL (ref 1.7–7.7)
Neutrophils Relative %: 3 %
Platelets: 28 10*3/uL — CL (ref 150–400)
RBC: 2.23 MIL/uL — ABNORMAL LOW (ref 4.22–5.81)
RDW: 19.9 % — ABNORMAL HIGH (ref 11.5–15.5)
Smear Review: NORMAL
WBC: 1.6 10*3/uL — ABNORMAL LOW (ref 4.0–10.5)
nRBC: 0 % (ref 0.0–0.2)

## 2020-04-12 LAB — PATHOLOGIST SMEAR REVIEW

## 2020-04-12 MED ORDER — SUCRALFATE 1 G PO TABS: ORAL_TABLET | ORAL | 2 refills | Status: AC

## 2020-04-12 MED ORDER — SODIUM CHLORIDE 0.9% IV SOLUTION
250.0000 mL | Freq: Once | INTRAVENOUS | Status: AC
  Administered 2020-04-12: 250 mL via INTRAVENOUS
  Filled 2020-04-12: qty 250

## 2020-04-12 MED ORDER — ACETAMINOPHEN 325 MG PO TABS
650.0000 mg | ORAL_TABLET | Freq: Once | ORAL | Status: AC
  Administered 2020-04-12: 650 mg via ORAL
  Filled 2020-04-12: qty 2

## 2020-04-12 MED ORDER — DIPHENHYDRAMINE HCL 25 MG PO CAPS
25.0000 mg | ORAL_CAPSULE | Freq: Once | ORAL | Status: AC
  Administered 2020-04-12: 25 mg via ORAL
  Filled 2020-04-12: qty 1

## 2020-04-12 NOTE — Progress Notes (Signed)
Temp 100. Per Beckey Rutter NP and Dr. Mike Gip okay to proceed with blood transfusion.   1500: pt reports a sore throat that has last for "4 days", Beckey Rutter NP aware and will call in a prescription and call son with update.   Pt tolerated transfusion well, no s/s of distress or reaction noted, pt and VS stable, per Beckey Rutter NP okay to discharge pt home. Pt and patient's son aware to call clinic with any concerns or report to ER in the event of an emergency, if new symptoms occur, or if temp increases. Both verbalize understanding. Pt and VS stable at discharge.

## 2020-04-12 NOTE — Progress Notes (Signed)
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 Trompeter is a 87 year old male with past medical history significant for A. fib, arthritis, B12 deficiency, hyperlipidemia, CHF, cardiac defibrillator in situ, gout, diabetes, emphysema, GERD, history of CVA and DVT who was recently diagnosed with head neck cancer.  He has a several month history of increasing hoarseness who was eventually evaluated by Dr. Richardson Landry who noted vocal cord neoplasm.  Underwent an endoscopy and biopsy which showed a friable lesion of the entire left vocal cord with extension of 8 mm to the subglottic region. Biopsies positive for squamous cell carcinoma.  He is currently receiving radiation.  He was also found to be pancytopenic.  Hemoglobin 6.5 MCV 115 platelets 48,000 and white blood cell count 1000 with an ANC of 200.  Recently had a bone marrow biopsy which showed 20% blasts concerning for acute leukemia.  He was hospitalized  from 03/11/2020-03/14/2020 for shortness of breath and found to have aspiration pneumonia.  He was initially in ICU for severe sepsis with septic shock and required Levophed.  During his hospital stay his bone marrow biopsy returned showing 20% blasts concerning for leukemia.  He was transferred to [facility] in the care of Jeff Davis.  He is currently on Levaquin, fluconazole and acyclovir.  Interval history- Today, he presents to clinic for blood transfusion. 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 Elected for radiation for head and neck cancer. Receiving supportive care for AML. Labs from 04/11/2020 show a WBC of 1.8, hemoglobin 8.1, platelets 36,000, ANC  0.0.  While getting his vital signs, patient noted to have a temperature of 99.9.  NP asked to come over for evaluation given his severe neutropenia. Patient's son who participates in visit by phone says his father was more fatigued yesterday than baseline but otherwise well. Patient complains of sore throat but otherwise feels at baseline. Denies any neurologic complaints. Denies recent fevers or illnesses. Denies any easy bleeding or bruising. Reports good appetite and denies weight loss. Denies chest pain. Denies any nausea, vomiting, constipation, or diarrhea. Denies urinary complaints. Patient offers no further specific complaints today.  Review of systems- Review of Systems  Constitutional: Positive for malaise/fatigue. Negative for chills, fever and weight loss.  HENT: Positive for sore throat. Negative for congestion, ear pain and tinnitus.   Eyes: Negative.  Negative for blurred vision and double vision.  Respiratory: Negative.  Negative for cough, sputum production and shortness of breath.   Cardiovascular: Negative.  Negative for chest pain, palpitations and leg swelling.  Gastrointestinal: Negative.  Negative for abdominal pain, constipation, diarrhea, nausea and vomiting.  Genitourinary: Negative for dysuria, frequency and urgency.  Musculoskeletal: Negative for back pain and falls.  Skin: Negative.  Negative for rash.  Neurological: Negative.  Negative for weakness and headaches.  Endo/Heme/Allergies: Bruises/bleeds easily.  Psychiatric/Behavioral: Negative.  Negative for depression. The patient is not nervous/anxious and does not have insomnia.      Allergies  Allergen Reactions  . Sulfa Antibiotics Rash and Shortness Of Breath    Per patient low doses do not cause issues, only higher doses Pt reports can tolerate low doses of sulfa drugs Pt reports can tolerate low  doses of sulfa drugs Per patient low doses do not cause issues, only higher doses   . Levofloxacin Other (See  Comments)    Low Blood sugar, Syncope     Past Medical History:  Diagnosis Date  . Afib ([place])   . Aortic atherosclerosis ([place])   . Arthritis   . B12 deficiency   . Bilateral carotid artery stenosis   . Cancer [facility])    prostate 2001  . Cardiac defibrillator in situ   . CHF (congestive heart failure) ([place])   . Chronic gouty arthritis   . Diabetes mellitus without complication ([place])   . Diabetic sensorimotor neuropathy ([place])   . Emphysema lung ([place])   . GERD (gastroesophageal reflux disease)   . History of cardioembolic cerebrovascular accident (CVA)   . History of DVT (deep vein thrombosis)   . History of kidney stones    H/O  . History of prostatectomy   . Hyperlipidemia   . LBBB (left bundle branch block)   . NICM (nonischemic cardiomyopathy) [facility])      Past Surgical History:  Procedure Laterality Date  . CARDIAC DEFIBRILLATOR PLACEMENT    . DIRECT LARYNGOSCOPY N/A 01/31/2020   Procedure: MICRO DIRECT LARYNGOSCOPYWITH BIOPSY;  Surgeon: Clyde Canterbury, MD;  Location: [facility] ORS;  Service: ENT;  Laterality: N/A;  . eyelid surgery    . KNEE ARTHROSCOPY Bilateral   . PROSTATECTOMY      Social History   Socioeconomic History  . Marital status: Widowed    Spouse name: Not on file  . Number of children: Not on file  . Years of education: Not on file  . Highest education level: Not on file  Occupational History  . Not on file  Tobacco Use  . Smoking status: Former Smoker    Packs/day: 1.00    Years: 30.00    Pack years: 30.00    Types: Cigarettes    Quit date: 01/25/1968    Years since quitting: 52.2  . Smokeless tobacco: Never Used  Vaping Use  . Vaping Use: Never used  Substance and Sexual Activity  . Alcohol use: Yes    Comment: WINE EVERYDAY  . Drug use: Never  . Sexual activity: Not on file  Other Topics Concern  . Not on file  Social History Narrative  . Not on file   Social Determinants of Health   Financial Resource Strain: Not on file  Food  Insecurity: Not on file  Transportation Needs: Not on file  Physical Activity: Not on file  Stress: Not on file  Social Connections: Not on file  Intimate Partner Violence: Not on file    Family History  Problem Relation Age of Onset  . Throat cancer Mother   . Diabetes Father      Current Outpatient Medications:  .  acetaminophen (TYLENOL) 650 MG suppository, Place 1 suppository (650 mg total) rectally every 6 (six) hours as needed for mild pain or fever., Disp: 12 suppository, Rfl: 0 .  albuterol (PROVENTIL) (2.5 MG/3ML) 0.083% nebulizer solution, Take 3 mLs (2.5 mg total) by nebulization every 4 (four) hours as needed for wheezing or shortness of breath., Disp: 75 mL, Rfl: 12 .  allopurinol (ZYLOPRIM) 300 MG tablet, Take 300 mg by mouth at bedtime., Disp: , Rfl:  .  aspirin EC 325 MG EC tablet, Take 1 tablet (325 mg total) by mouth daily. (Patient not taking: Reported on 03/27/2020), Disp: 30 tablet, Rfl: 0 .  atorvastatin (LIPITOR) 10 MG tablet, Take 10 mg  by mouth daily. (Patient not taking: Reported on 03/27/2020), Disp: , Rfl:  .  Carboxymethylcellulose Sodium 0.25 % SOLN, Administer 1 drop to both eyes Three (3) times a day., Disp: , Rfl:  .  carvedilol (COREG) 3.125 MG tablet, , Disp: , Rfl:  .  cefdinir (OMNICEF) 300 MG capsule, Take by mouth., Disp: , Rfl:  .  chlorhexidine (PERIDEX) 0.12 % solution, 15 mLs by Mouth Rinse route 2 (two) times daily. (Patient not taking: Reported on 03/27/2020), Disp: 120 mL, Rfl: 0 .  feeding supplement (ENSURE ENLIVE / ENSURE PLUS) LIQD, Take 237 mLs by mouth 2 (two) times daily between meals., Disp: 237 mL, Rfl: 12 .  fluconazole (DIFLUCAN) 200 MG tablet, Take by mouth., Disp: , Rfl:  .  furosemide (LASIX) 20 MG tablet, Take by mouth., Disp: , Rfl:  .  levofloxacin (LEVAQUIN) 250 MG tablet, Take 500 mg (2 tablets) on the first day then 250 mg (1 tablet) every 24 hours., Disp: 31 tablet, Rfl: 1 .  valACYclovir (VALTREX) 500 MG tablet, Take by  mouth., Disp: , Rfl:   Physical exam: There were no vitals filed for this visit. Physical Exam Constitutional:      Appearance: He is ill-appearing.  HENT:     Mouth/Throat:     Mouth: Mucous membranes are dry.     Pharynx: Posterior oropharyngeal erythema present.  Cardiovascular:     Rate and Rhythm: Normal rate and regular rhythm.  Pulmonary:     Effort: Pulmonary effort is normal. No respiratory distress.  Abdominal:     Tenderness: There is no abdominal tenderness. There is no guarding.  Psychiatric:        Mood and Affect: Mood normal. Affect is flat.        Behavior: Behavior is cooperative.      CMP Latest Ref Rng & Units 03/13/2020  Glucose 70 - 99 mg/dL 153(H)  BUN 8 - 23 mg/dL 23  Creatinine 0.61 - 1.24 mg/dL 1.32(H)  Sodium 135 - 145 mmol/L 133(L)  Potassium 3.5 - 5.1 mmol/L 3.6  Chloride 98 - 111 mmol/L 104  CO2 22 - 32 mmol/L 25  Calcium 8.9 - 10.3 mg/dL 7.4(L)  Total Protein 6.5 - 8.1 g/dL -  Total Bilirubin 0.3 - 1.2 mg/dL -  Alkaline Phos 38 - 126 U/L -  AST 15 - 41 U/L -  ALT 0 - 44 U/L -   CBC Latest Ref Rng & Units 04/12/2020  WBC 4.0 - 10.5 K/uL 1.6(L)  Hemoglobin 13.0 - 17.0 g/dL 7.4(L)  Hematocrit 39.0 - 52.0 % 22.4(L)  Platelets 150 - 400 K/uL 28(LL)    No images are attached to the encounter.  No results found.   Assessment and plan- Patient is a 87 y.o. male with AML, receiving supportive care, and head and neck cancer currently receiving radiation, who [facility] for elevated temperature in setting of neutropenia. Temperature max was 100.1 in clinic, tympanic. BP generally stable with patient's baseline but overall decreased. No tachycardia but does take a beta blocker. Proceed with blood transfusion today. Ok to premedicate with tylenol. Per patient's son, patient's fatigue improves with blood transfusion. Discussed with Dr. Mike Gip who agrees with this plan. Will also check UA, culture, Blood cultures x 2, and cbc  today. ANC 100. Patient is on antimicrobial prophylaxis - diflucan, valtrex, levaquin. He is however high risk of aspiration, and infection. Any infection, I would consider atypical given prophylaxis and recommend hospitalization. Patient previously required  pressure support.   No change to temperature throughout transfusion. BP briefly dipped into 01U systolic but returned to baseline without intervention. Patient was monitored closely throughout visit today. Clinically improved post transfusion. Advised patient's son to monitor temperature closely, if > 100.4, recommend ER. Continue neutropenic precautions.   Will add sucralfate for sore throat secondary to radiation.    Visit Diagnosis 1. 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 Elevated temperature   3. Other neutropenia ([place])     Patient expressed understanding and was in agreement with this plan. He also understands that He can call clinic at any time with any questions, concerns, or complaints.   I spent a total of 45 minutes reviewing chart data, face-to-face evaluation with the patient, counseling and coordination of care as detailed above.  Thank you for allowing me to participate in the care of this very pleasant patient.

## 2020-04-13 ENCOUNTER — Other Ambulatory Visit: Payer: Self-pay

## 2020-04-13 ENCOUNTER — Emergency Department: Payer: Medicare HMO

## 2020-04-13 ENCOUNTER — Inpatient Hospital Stay
Admission: EM | Admit: 2020-04-13 | Discharge: 2020-05-12 | DRG: 871 | Disposition: E | Payer: Medicare HMO | Attending: Internal Medicine | Admitting: Internal Medicine

## 2020-04-13 ENCOUNTER — Encounter: Payer: Self-pay | Admitting: Intensive Care

## 2020-04-13 ENCOUNTER — Ambulatory Visit: Payer: Medicare HMO

## 2020-04-13 DIAGNOSIS — I5042 Chronic combined systolic (congestive) and diastolic (congestive) heart failure: Secondary | ICD-10-CM | POA: Diagnosis present

## 2020-04-13 DIAGNOSIS — C329 Malignant neoplasm of larynx, unspecified: Secondary | ICD-10-CM | POA: Diagnosis present

## 2020-04-13 DIAGNOSIS — R5081 Fever presenting with conditions classified elsewhere: Secondary | ICD-10-CM | POA: Diagnosis present

## 2020-04-13 DIAGNOSIS — J189 Pneumonia, unspecified organism: Secondary | ICD-10-CM | POA: Diagnosis not present

## 2020-04-13 DIAGNOSIS — Z923 Personal history of irradiation: Secondary | ICD-10-CM

## 2020-04-13 DIAGNOSIS — J9601 Acute respiratory failure with hypoxia: Secondary | ICD-10-CM | POA: Diagnosis not present

## 2020-04-13 DIAGNOSIS — D709 Neutropenia, unspecified: Secondary | ICD-10-CM | POA: Diagnosis not present

## 2020-04-13 DIAGNOSIS — M1A9XX0 Chronic gout, unspecified, without tophus (tophi): Secondary | ICD-10-CM | POA: Diagnosis present

## 2020-04-13 DIAGNOSIS — E538 Deficiency of other specified B group vitamins: Secondary | ICD-10-CM | POA: Diagnosis present

## 2020-04-13 DIAGNOSIS — K219 Gastro-esophageal reflux disease without esophagitis: Secondary | ICD-10-CM | POA: Diagnosis present

## 2020-04-13 DIAGNOSIS — E785 Hyperlipidemia, unspecified: Secondary | ICD-10-CM | POA: Diagnosis present

## 2020-04-13 DIAGNOSIS — Z8546 Personal history of malignant neoplasm of prostate: Secondary | ICD-10-CM

## 2020-04-13 DIAGNOSIS — C9590 Leukemia, unspecified not having achieved remission: Secondary | ICD-10-CM

## 2020-04-13 DIAGNOSIS — N179 Acute kidney failure, unspecified: Secondary | ICD-10-CM | POA: Diagnosis present

## 2020-04-13 DIAGNOSIS — Z20822 Contact with and (suspected) exposure to covid-19: Secondary | ICD-10-CM | POA: Diagnosis present

## 2020-04-13 DIAGNOSIS — G9341 Metabolic encephalopathy: Secondary | ICD-10-CM | POA: Diagnosis present

## 2020-04-13 DIAGNOSIS — I130 Hypertensive heart and chronic kidney disease with heart failure and stage 1 through stage 4 chronic kidney disease, or unspecified chronic kidney disease: Secondary | ICD-10-CM | POA: Diagnosis present

## 2020-04-13 DIAGNOSIS — I700 Atherosclerosis of aorta: Secondary | ICD-10-CM | POA: Diagnosis present

## 2020-04-13 DIAGNOSIS — Z9079 Acquired absence of other genital organ(s): Secondary | ICD-10-CM

## 2020-04-13 DIAGNOSIS — D61818 Other pancytopenia: Secondary | ICD-10-CM | POA: Diagnosis present

## 2020-04-13 DIAGNOSIS — R0602 Shortness of breath: Secondary | ICD-10-CM

## 2020-04-13 DIAGNOSIS — Z881 Allergy status to other antibiotic agents status: Secondary | ICD-10-CM

## 2020-04-13 DIAGNOSIS — N1831 Chronic kidney disease, stage 3a: Secondary | ICD-10-CM | POA: Diagnosis present

## 2020-04-13 DIAGNOSIS — J690 Pneumonitis due to inhalation of food and vomit: Secondary | ICD-10-CM | POA: Diagnosis present

## 2020-04-13 DIAGNOSIS — R6520 Severe sepsis without septic shock: Secondary | ICD-10-CM | POA: Diagnosis present

## 2020-04-13 DIAGNOSIS — J9690 Respiratory failure, unspecified, unspecified whether with hypoxia or hypercapnia: Secondary | ICD-10-CM | POA: Diagnosis not present

## 2020-04-13 DIAGNOSIS — I10 Essential (primary) hypertension: Secondary | ICD-10-CM | POA: Diagnosis present

## 2020-04-13 DIAGNOSIS — D849 Immunodeficiency, unspecified: Secondary | ICD-10-CM | POA: Diagnosis present

## 2020-04-13 DIAGNOSIS — R918 Other nonspecific abnormal finding of lung field: Secondary | ICD-10-CM | POA: Diagnosis not present

## 2020-04-13 DIAGNOSIS — J440 Chronic obstructive pulmonary disease with acute lower respiratory infection: Secondary | ICD-10-CM | POA: Diagnosis present

## 2020-04-13 DIAGNOSIS — E1140 Type 2 diabetes mellitus with diabetic neuropathy, unspecified: Secondary | ICD-10-CM | POA: Diagnosis present

## 2020-04-13 DIAGNOSIS — Z66 Do not resuscitate: Secondary | ICD-10-CM | POA: Diagnosis present

## 2020-04-13 DIAGNOSIS — I447 Left bundle-branch block, unspecified: Secondary | ICD-10-CM | POA: Diagnosis present

## 2020-04-13 DIAGNOSIS — C9290 Myeloid leukemia, unspecified, not having achieved remission: Secondary | ICD-10-CM | POA: Diagnosis not present

## 2020-04-13 DIAGNOSIS — R000 Tachycardia, unspecified: Secondary | ICD-10-CM | POA: Diagnosis not present

## 2020-04-13 DIAGNOSIS — Z8512 Personal history of malignant neoplasm of trachea: Secondary | ICD-10-CM

## 2020-04-13 DIAGNOSIS — E870 Hyperosmolality and hypernatremia: Secondary | ICD-10-CM | POA: Diagnosis present

## 2020-04-13 DIAGNOSIS — D696 Thrombocytopenia, unspecified: Secondary | ICD-10-CM

## 2020-04-13 DIAGNOSIS — Z86718 Personal history of other venous thrombosis and embolism: Secondary | ICD-10-CM

## 2020-04-13 DIAGNOSIS — Z8673 Personal history of transient ischemic attack (TIA), and cerebral infarction without residual deficits: Secondary | ICD-10-CM

## 2020-04-13 DIAGNOSIS — Z87891 Personal history of nicotine dependence: Secondary | ICD-10-CM

## 2020-04-13 DIAGNOSIS — E860 Dehydration: Secondary | ICD-10-CM | POA: Diagnosis present

## 2020-04-13 DIAGNOSIS — I5022 Chronic systolic (congestive) heart failure: Secondary | ICD-10-CM | POA: Diagnosis present

## 2020-04-13 DIAGNOSIS — E1169 Type 2 diabetes mellitus with other specified complication: Secondary | ICD-10-CM | POA: Diagnosis present

## 2020-04-13 DIAGNOSIS — N189 Chronic kidney disease, unspecified: Secondary | ICD-10-CM | POA: Diagnosis present

## 2020-04-13 DIAGNOSIS — I428 Other cardiomyopathies: Secondary | ICD-10-CM | POA: Diagnosis present

## 2020-04-13 DIAGNOSIS — J90 Pleural effusion, not elsewhere classified: Secondary | ICD-10-CM | POA: Diagnosis not present

## 2020-04-13 DIAGNOSIS — Z882 Allergy status to sulfonamides status: Secondary | ICD-10-CM

## 2020-04-13 DIAGNOSIS — C9200 Acute myeloblastic leukemia, not having achieved remission: Secondary | ICD-10-CM | POA: Diagnosis present

## 2020-04-13 DIAGNOSIS — J439 Emphysema, unspecified: Secondary | ICD-10-CM | POA: Diagnosis present

## 2020-04-13 DIAGNOSIS — Z515 Encounter for palliative care: Secondary | ICD-10-CM

## 2020-04-13 DIAGNOSIS — J984 Other disorders of lung: Secondary | ICD-10-CM | POA: Diagnosis not present

## 2020-04-13 DIAGNOSIS — E1165 Type 2 diabetes mellitus with hyperglycemia: Secondary | ICD-10-CM | POA: Diagnosis present

## 2020-04-13 DIAGNOSIS — A419 Sepsis, unspecified organism: Secondary | ICD-10-CM | POA: Diagnosis present

## 2020-04-13 DIAGNOSIS — I482 Chronic atrial fibrillation, unspecified: Secondary | ICD-10-CM | POA: Diagnosis present

## 2020-04-13 DIAGNOSIS — Z79899 Other long term (current) drug therapy: Secondary | ICD-10-CM

## 2020-04-13 DIAGNOSIS — T380X5A Adverse effect of glucocorticoids and synthetic analogues, initial encounter: Secondary | ICD-10-CM | POA: Diagnosis present

## 2020-04-13 DIAGNOSIS — E1122 Type 2 diabetes mellitus with diabetic chronic kidney disease: Secondary | ICD-10-CM | POA: Diagnosis present

## 2020-04-13 DIAGNOSIS — Z8521 Personal history of malignant neoplasm of larynx: Secondary | ICD-10-CM

## 2020-04-13 DIAGNOSIS — R059 Cough, unspecified: Secondary | ICD-10-CM | POA: Diagnosis not present

## 2020-04-13 DIAGNOSIS — Z950 Presence of cardiac pacemaker: Secondary | ICD-10-CM | POA: Diagnosis not present

## 2020-04-13 DIAGNOSIS — E11649 Type 2 diabetes mellitus with hypoglycemia without coma: Secondary | ICD-10-CM | POA: Diagnosis not present

## 2020-04-13 DIAGNOSIS — L89152 Pressure ulcer of sacral region, stage 2: Secondary | ICD-10-CM | POA: Diagnosis present

## 2020-04-13 DIAGNOSIS — Z95810 Presence of automatic (implantable) cardiac defibrillator: Secondary | ICD-10-CM

## 2020-04-13 DIAGNOSIS — Z833 Family history of diabetes mellitus: Secondary | ICD-10-CM

## 2020-04-13 DIAGNOSIS — Z7982 Long term (current) use of aspirin: Secondary | ICD-10-CM

## 2020-04-13 DIAGNOSIS — R509 Fever, unspecified: Secondary | ICD-10-CM | POA: Diagnosis not present

## 2020-04-13 LAB — CBC WITH DIFFERENTIAL/PLATELET
Abs Immature Granulocytes: 0.05 10*3/uL (ref 0.00–0.07)
Basophils Absolute: 0 10*3/uL (ref 0.0–0.1)
Basophils Relative: 0 %
Eosinophils Absolute: 0.1 10*3/uL (ref 0.0–0.5)
Eosinophils Relative: 3 %
HCT: 26.2 % — ABNORMAL LOW (ref 39.0–52.0)
Hemoglobin: 8.8 g/dL — ABNORMAL LOW (ref 13.0–17.0)
Immature Granulocytes: 3 %
Lymphocytes Relative: 47 %
Lymphs Abs: 0.9 10*3/uL (ref 0.7–4.0)
MCH: 33 pg (ref 26.0–34.0)
MCHC: 33.6 g/dL (ref 30.0–36.0)
MCV: 98.1 fL (ref 80.0–100.0)
Monocytes Absolute: 0.8 10*3/uL (ref 0.1–1.0)
Monocytes Relative: 42 %
Neutro Abs: 0.1 10*3/uL — CL (ref 1.7–7.7)
Neutrophils Relative %: 5 %
Platelets: 31 10*3/uL — ABNORMAL LOW (ref 150–400)
RBC: 2.67 MIL/uL — ABNORMAL LOW (ref 4.22–5.81)
RDW: 20.3 % — ABNORMAL HIGH (ref 11.5–15.5)
Smear Review: DECREASED
WBC: 1.8 10*3/uL — ABNORMAL LOW (ref 4.0–10.5)
nRBC: 0 % (ref 0.0–0.2)

## 2020-04-13 LAB — COMPREHENSIVE METABOLIC PANEL
ALT: 12 U/L (ref 0–44)
AST: 20 U/L (ref 15–41)
Albumin: 3.2 g/dL — ABNORMAL LOW (ref 3.5–5.0)
Alkaline Phosphatase: 69 U/L (ref 38–126)
Anion gap: 9 (ref 5–15)
BUN: 34 mg/dL — ABNORMAL HIGH (ref 8–23)
CO2: 27 mmol/L (ref 22–32)
Calcium: 8.8 mg/dL — ABNORMAL LOW (ref 8.9–10.3)
Chloride: 100 mmol/L (ref 98–111)
Creatinine, Ser: 1.7 mg/dL — ABNORMAL HIGH (ref 0.61–1.24)
GFR, Estimated: 38 mL/min — ABNORMAL LOW (ref >60)
Glucose, Bld: 236 mg/dL — ABNORMAL HIGH (ref 70–99)
Potassium: 4.3 mmol/L (ref 3.5–5.1)
Sodium: 136 mmol/L (ref 135–145)
Total Bilirubin: 0.8 mg/dL (ref 0.3–1.2)
Total Protein: 6.9 g/dL (ref 6.5–8.1)

## 2020-04-13 LAB — PROTIME-INR
INR: 1.2 (ref 0.8–1.2)
Prothrombin Time: 14.9 seconds (ref 11.4–15.2)

## 2020-04-13 LAB — LACTIC ACID, PLASMA
Lactic Acid, Venous: 1.9 mmol/L (ref 0.5–1.9)
Lactic Acid, Venous: 1.2 mmol/L (ref 0.5–1.9)

## 2020-04-13 LAB — RESP PANEL BY RT-PCR (FLU A&B, COVID) ARPGX2
Influenza A by PCR: NEGATIVE
Influenza B by PCR: NEGATIVE
SARS Coronavirus 2 by RT PCR: NEGATIVE

## 2020-04-13 LAB — BRAIN NATRIURETIC PEPTIDE: B Natriuretic Peptide: 63.1 pg/mL (ref 0.0–100.0)

## 2020-04-13 LAB — APTT: aPTT: 41 seconds — ABNORMAL HIGH (ref 24–36)

## 2020-04-13 MED ORDER — ACETAMINOPHEN 325 MG PO TABS
650.0000 mg | ORAL_TABLET | Freq: Once | ORAL | Status: AC
  Administered 2020-04-13: 650 mg via ORAL
  Filled 2020-04-13: qty 2

## 2020-04-13 MED ORDER — POLYETHYLENE GLYCOL 3350 17 G PO PACK: 17.0000 g | PACK | Freq: Every day | ORAL | Status: DC | PRN

## 2020-04-13 MED ORDER — VANCOMYCIN HCL IN DEXTROSE 1-5 GM/200ML-% IV SOLN
1000.0000 mg | Freq: Once | INTRAVENOUS | Status: AC
  Administered 2020-04-13: 1000 mg via INTRAVENOUS
  Filled 2020-04-13: qty 200

## 2020-04-13 MED ORDER — LACTATED RINGERS IV BOLUS (SEPSIS)
1000.0000 mL | Freq: Once | INTRAVENOUS | Status: AC
  Administered 2020-04-13: 1000 mL via INTRAVENOUS

## 2020-04-13 MED ORDER — ACETAMINOPHEN 325 MG PO TABS: 650.0000 mg | ORAL_TABLET | Freq: Four times a day (QID) | ORAL | Status: DC | PRN

## 2020-04-13 MED ORDER — SODIUM CHLORIDE 0.9 % IV SOLN
2.0000 g | INTRAVENOUS | Status: DC
  Filled 2020-04-13: qty 2

## 2020-04-13 MED ORDER — TRAZODONE HCL 50 MG PO TABS
25.0000 mg | ORAL_TABLET | Freq: Every evening | ORAL | Status: DC | PRN
  Administered 2020-04-14: 25 mg via ORAL
  Filled 2020-04-13: qty 1

## 2020-04-13 MED ORDER — OXYCODONE HCL 5 MG PO TABS
5.0000 mg | ORAL_TABLET | ORAL | Status: DC | PRN
  Administered 2020-04-14 – 2020-04-15 (×5): 5 mg via ORAL
  Filled 2020-04-13 (×5): qty 1

## 2020-04-13 MED ORDER — ONDANSETRON HCL 4 MG PO TABS: 4.0000 mg | ORAL_TABLET | Freq: Four times a day (QID) | ORAL | Status: DC | PRN

## 2020-04-13 MED ORDER — METRONIDAZOLE IN NACL 5-0.79 MG/ML-% IV SOLN
500.0000 mg | Freq: Three times a day (TID) | INTRAVENOUS | Status: DC
  Administered 2020-04-13 – 2020-04-17 (×12): 500 mg via INTRAVENOUS
  Filled 2020-04-13 (×14): qty 100

## 2020-04-13 MED ORDER — SODIUM CHLORIDE 0.9 % IV SOLN
2.0000 g | Freq: Once | INTRAVENOUS | Status: AC
  Administered 2020-04-13: 2 g via INTRAVENOUS
  Filled 2020-04-13: qty 2

## 2020-04-13 MED ORDER — SODIUM CHLORIDE 0.9 % IV SOLN: 2.0000 g | Freq: Three times a day (TID) | INTRAVENOUS | Status: DC

## 2020-04-13 MED ORDER — ACETAMINOPHEN 650 MG RE SUPP: 650.0000 mg | Freq: Four times a day (QID) | RECTAL | Status: DC | PRN

## 2020-04-13 MED ORDER — VANCOMYCIN HCL IN DEXTROSE 750-5 MG/150ML-% IV SOLN
750.0000 mg | Freq: Once | INTRAVENOUS | Status: AC
  Administered 2020-04-13: 750 mg via INTRAVENOUS
  Filled 2020-04-13: qty 150

## 2020-04-13 MED ORDER — ONDANSETRON HCL 4 MG/2ML IJ SOLN: 4.0000 mg | Freq: Four times a day (QID) | INTRAMUSCULAR | Status: DC | PRN

## 2020-04-13 MED ORDER — LIDOCAINE VISCOUS HCL 2 % MT SOLN
15.0000 mL | OROMUCOSAL | Status: DC | PRN
  Administered 2020-04-16 – 2020-04-17 (×2): 15 mL via OROMUCOSAL
  Filled 2020-04-13 (×4): qty 15

## 2020-04-13 MED ORDER — LACTATED RINGERS IV SOLN: INTRAVENOUS | Status: DC

## 2020-04-13 NOTE — ED Provider Notes (Signed)
[facility] Emergency Department Provider Note  ____________________________________________   Event Date/Time   First MD Initiated Contact with Patient 05/02/2020 1752     (approximate)  I have reviewed the triage vital signs and the nursing notes.   HISTORY  Chief Complaint Shortness of Breath and Fever    HPI Thomas Morton is a 87 y.o. male with history of leukemia, tracheal cancer, here with fever and weakness.  The patient reportedly has had increasing cough, subjective fever, and weakness over the last 48 hours.   Patient actually went to get a scheduled blood transfusion yesterday.  He had a temperature of 100.1 at that time but otherwise felt okay.  He had a blood culture and lab work sent and was sent home.  He states that since then, he has had fever up to 101, worsening fatigue.  He has been on Levaquin and antiviral prophylaxis.  He states he feels fatigued, weak.  His primary complaint is shortness of breath that is productive of yellow-green sputum.  He has had poor appetite.  He said difficulty getting around the house today.  No specific alleviating or aggravating factors.  No known sick contacts.       Past Medical History:  Diagnosis Date  . Afib ([place])   . Aortic atherosclerosis ([place])   . Arthritis   . B12 deficiency   . Bilateral carotid artery stenosis   . Cancer [facility])    prostate 2001  . Cardiac defibrillator in situ   . CHF (congestive heart failure) ([place])   . Chronic gouty arthritis   . Diabetes mellitus without complication ([place])   . Diabetic sensorimotor neuropathy ([place])   . Emphysema lung ([place])   . GERD (gastroesophageal reflux disease)   . History of cardioembolic cerebrovascular accident (CVA)   . History of DVT (deep vein thrombosis)   . History of kidney stones    H/O  . History of prostatectomy   . Hyperlipidemia   . LBBB (left bundle branch block)   . NICM (nonischemic cardiomyopathy) [facility])     Patient Active  Problem List   Diagnosis Date Noted  . Goals of care, counseling/discussion 04/12/2020  . Symptomatic anemia 04/01/2020  . Cardiac defibrillator in situ 03/27/2020  . Acute myeloid leukemia not having achieved remission ([place])   . Pressure injury of skin 03/13/2020  . Chronic systolic CHF (congestive heart failure) ([place])   . Aspiration pneumonia ([place]) 03/11/2020  . HCAP (healthcare-associated pneumonia) 03/11/2020  . Severe sepsis with septic shock ([place]) 03/11/2020  . Acute renal failure superimposed on stage 3a chronic kidney disease ([place]) 03/11/2020  . GERD (gastroesophageal reflux disease)   . Chronic gouty arthritis   . History of cardioembolic cerebrovascular accident (CVA)   . Hyperlipidemia   . Type 2 diabetes mellitus with hyperlipidemia ([place])   . Atrial fibrillation, chronic ([place])   . Pancytopenia ([place])   . Elevated troponin   . Laryngeal cancer ([place])   . Mild emphysema ([place]) 03/31/2018  . Acute respiratory failure with hypoxia ([place]) 12/22/2017  . LBBB (left bundle branch block) 04/29/2016  . B12 deficiency 02/14/2016  . Asymptomatic bilateral carotid artery stenosis 10/24/2015  . Aortic atherosclerosis ([place]) 10/20/2015  . History of CVA (cerebrovascular accident) 10/20/2015  . Dizziness and giddiness 10/15/2015  . CVA (cerebral vascular accident) ([place]) 10/14/2015  . Numbness 10/14/2015  . Chronic renal insufficiency 10/14/2015  . Essential hypertension 10/14/2015  . Diabetic sensorimotor neuropathy ([place]) 02/13/2015  . History of prostate  cancer 02/08/2014  . DM (diabetes mellitus) type II controlled, neurological manifestation ([place]) 08/04/2013  . Encounter for fitting or adjustment of implantable cardioverter-defibrillator (ICD) 03/30/2012  . History of DVT (deep vein thrombosis) 03/29/2012  . H/O prostatectomy 01/12/1994    Past Surgical History:  Procedure Laterality Date  . CARDIAC DEFIBRILLATOR PLACEMENT    . DIRECT LARYNGOSCOPY N/A 01/31/2020   Procedure:  MICRO DIRECT LARYNGOSCOPYWITH BIOPSY;  Surgeon: Clyde Canterbury, MD;  Location: [facility] ORS;  Service: ENT;  Laterality: N/A;  . eyelid surgery    . KNEE ARTHROSCOPY Bilateral   . PROSTATECTOMY      Prior to Admission medications   Medication Sig Start Date End Date Taking? Authorizing Provider  acetaminophen (TYLENOL) 650 MG suppository Place 1 suppository (650 mg total) rectally every 6 (six) hours as needed for mild pain or fever. 03/14/20   Loletha Grayer, MD  albuterol (PROVENTIL) (2.5 MG/3ML) 0.083% nebulizer solution Take 3 mLs (2.5 mg total) by nebulization every 4 (four) hours as needed for wheezing or shortness of breath. 03/14/20   Loletha Grayer, MD  allopurinol (ZYLOPRIM) 300 MG tablet Take 300 mg by mouth at bedtime. 02/13/15   [provider]  aspirin EC 325 MG EC tablet Take 1 tablet (325 mg total) by mouth daily. Patient not taking: Reported on 03/27/2020 10/16/15   Theodoro Grist, MD  atorvastatin (LIPITOR) 10 MG tablet Take 10 mg by mouth daily. Patient not taking: Reported on 03/27/2020 01/26/20   [provider]  Carboxymethylcellulose Sodium 0.25 % SOLN Administer 1 drop to both eyes Three (3) times a day. 03/18/20   [provider]  carvedilol (COREG) 3.125 MG tablet     [provider]  cefdinir (OMNICEF) 300 MG capsule Take by mouth. 03/18/20   [provider]  chlorhexidine (PERIDEX) 0.12 % solution 15 mLs by Mouth Rinse route 2 (two) times daily. Patient not taking: Reported on 03/27/2020 03/14/20   Loletha Grayer, MD  feeding supplement (ENSURE ENLIVE / ENSURE PLUS) LIQD Take 237 mLs by mouth 2 (two) times daily between meals. 03/14/20   Loletha Grayer, MD  fluconazole (DIFLUCAN) 200 MG tablet Take by mouth. 03/18/20   [provider]  furosemide (LASIX) 20 MG tablet Take by mouth. 03/26/20 03/26/21  [provider]  levofloxacin (LEVAQUIN) 250 MG tablet Take 500 mg (2 tablets) on the first day then 250 mg (1 tablet) every  24 hours. 03/28/20   Lequita Asal, MD  sucralfate (CARAFATE) 1 g tablet Take 1 tablet (1 g total) by mouth 3 (three) times daily. Dissolve in 3-4 tbsp warm water, swish and swallow. For sore throat related to radiation. 04/12/20   Verlon Au, NP  valACYclovir (VALTREX) 500 MG tablet Take by mouth. 03/18/20   [provider]    Allergies Sulfa antibiotics and Levofloxacin  Family History  Problem Relation Age of Onset  . Throat cancer Mother   . Diabetes Father     Social History Social History   Tobacco Use  . Smoking status: Former Smoker    Packs/day: 1.00    Years: 30.00    Pack years: 30.00    Types: Cigarettes    Quit date: 01/25/1968    Years since quitting: 52.2  . Smokeless tobacco: Never Used  Vaping Use  . Vaping Use: Never used  Substance Use Topics  . Alcohol use: Yes    Alcohol/week: 7.0 standard drinks    Types: 7 Glasses of wine per week  Comment: WINE EVERYDAY  . Drug use: Never    Review of Systems  Review of Systems  Constitutional: Positive for fatigue and fever. Negative for chills.  HENT: Negative for sore throat.   Respiratory: Positive for cough. Negative for shortness of breath.   Cardiovascular: Negative for chest pain.  Gastrointestinal: Positive for nausea. Negative for abdominal pain.  Genitourinary: Negative for flank pain.  Musculoskeletal: Negative for neck pain.  Skin: Negative for rash and wound.  Allergic/Immunologic: Negative for immunocompromised state.  Neurological: Positive for weakness. Negative for numbness.  Hematological: Does not bruise/bleed easily.  All other systems reviewed and are negative.    ____________________________________________  PHYSICAL EXAM:      VITAL SIGNS: ED Triage Vitals  Enc Vitals Group     BP 04/16/2020 1800 (!) 114/57     Pulse Rate 05/07/2020 1800 (!) 112     Resp 04/20/2020 1800 (!) 30     Temp 04/23/2020 1800 (!) 100.6 F (38.1 C)     Temp Source 04/23/2020 1800 Oral      SpO2 04/17/2020 1800 93 %     Weight 05/06/2020 1757 167 lb (75.8 kg)     Height 05/05/2020 1757 5\' 9"  (1.753 m)     Head Circumference --      Peak Flow --      Pain Score 04/21/2020 1757 0     Pain Loc --      Pain Edu? --      Excl. in [place]? --      Physical Exam Vitals and nursing note reviewed.  Constitutional:      General: He is not in acute distress.    Appearance: He is well-developed.  HENT:     Head: Normocephalic and atraumatic.  Eyes:     Conjunctiva/sclera: Conjunctivae normal.  Cardiovascular:     Rate and Rhythm: Regular rhythm. Tachycardia present.     Heart sounds: Normal heart sounds. No murmur heard. No friction rub.  Pulmonary:     Effort: Pulmonary effort is normal. No respiratory distress.     Breath sounds: Examination of the right-middle field reveals rhonchi. Examination of the left-middle field reveals rhonchi. Examination of the right-lower field reveals rhonchi and rales. Examination of the left-lower field reveals rhonchi and rales. Rhonchi and rales present. No wheezing.  Abdominal:     General: There is no distension.     Palpations: Abdomen is soft.     Tenderness: There is no abdominal tenderness.  Musculoskeletal:     Cervical back: Neck supple.  Skin:    General: Skin is warm.     Capillary Refill: Capillary refill takes less than 2 seconds.  Neurological:     Mental Status: He is alert and oriented to person, place, and time.     Motor: No abnormal muscle tone.       ____________________________________________   LABS (all labs ordered are listed, but only abnormal results are displayed)  Labs Reviewed  COMPREHENSIVE METABOLIC PANEL - Abnormal; Notable for the following components:      Result Value   Glucose, Bld 236 (*)    BUN 34 (*)    Creatinine, Ser 1.70 (*)    Calcium 8.8 (*)    Albumin 3.2 (*)    GFR, Estimated 38 (*)    All other components within normal limits  APTT - Abnormal; Notable for the following components:   aPTT  41 (*)    All other components within normal limits  CBC  WITH DIFFERENTIAL/PLATELET - Abnormal; Notable for the following components:   WBC 1.8 (*)    RBC 2.67 (*)    Hemoglobin 8.8 (*)    HCT 26.2 (*)    RDW 20.3 (*)    Platelets 31 (*)    All other components within normal limits  RESP PANEL BY RT-PCR (FLU A&B, COVID) ARPGX2  CULTURE, BLOOD (ROUTINE X 2)  CULTURE, BLOOD (ROUTINE X 2)  URINE CULTURE  LACTIC ACID, PLASMA  PROTIME-INR  LACTIC ACID, PLASMA  CBC WITH DIFFERENTIAL/PLATELET  URINALYSIS, COMPLETE (UACMP) WITH MICROSCOPIC  BRAIN NATRIURETIC PEPTIDE    ____________________________________________  EKG: Sinus tachycardia, interventricular conduction delay.  Ventricular rate 113.  QRS 131, QTc 47.  When compared to prior, conduction delay is similar.  No acute ST elevations. ________________________________________  RADIOLOGY All imaging, including plain films, CT scans, and ultrasounds, independently reviewed by me, and interpretations confirmed via formal radiology reads.  ED MD interpretation:   Chest x-ray: Persistent infiltrate in the right upper lobe with increasing opacity in the right lower lobe worrisome for pneumonia.  Official radiology report(s): DG Chest Port 1 View  Result Date: 04/22/2020 CLINICAL DATA:  Questionable sepsis.  Shortness of breath and fever. EXAM: PORTABLE CHEST 1 VIEW COMPARISON:  March 11, 2020 FINDINGS: Continued infiltrate in the right upper lobe. Mild increased opacity in the right base. Small left effusion. Mild opacity in the left base seen as well, unchanged. The cardiomediastinal silhouette is stable. No pneumothorax. No other acute abnormalities. IMPRESSION: Persistent infiltrate in the right upper lobe. Increasing opacity in the right lower lobe. Stable mild opacity in left base may represent atelectasis. The findings are still concerning for multifocal pneumonia, mildly worsened in the interval. However, the opacity in the right  upper lobe has been present for greater than a month. CT imaging could further evaluate as clinically warranted. Otherwise, recommend treatment with a short-term follow-up chest x-ray to ensure resolution. Electronically Signed   By: Dorise Bullion III M.D   On: 04/26/2020 18:30    ____________________________________________  PROCEDURES   Procedure(s) performed (including Critical Care):  Procedures  ____________________________________________  INITIAL IMPRESSION / MDM / [place] / ED COURSE  As part of my medical decision making, I reviewed the following data within the [place] notes reviewed and incorporated, Old chart reviewed, Notes from prior ED visits, and [place] Controlled Substance Database       *DAREY HERSHBERGER was evaluated in Emergency Department on 05/10/2020 for the symptoms described in the history of present illness. He was evaluated in the context of the global COVID-19 pandemic, which necessitated consideration that the patient might be at risk for infection with the SARS-CoV-2 virus that causes COVID-19. Institutional protocols and algorithms that pertain to the evaluation of patients at risk for COVID-19 are in a state of rapid change based on information released by regulatory bodies including the CDC and federal and state organizations. These policies and algorithms were followed during the patient's care in the ED.  Some ED evaluations and interventions may be delayed as a result of limited staffing during the pandemic.*     Medical Decision Making: 87 year old male here with fever, tachycardia, fatigue.  Patient meets sepsis criteria on arrival and code sepsis initiated with broad-spectrum antibiotics.  Anaerobic coverage added for suspected aspiration pneumonia contributing to sepsis in this immunosuppressed patient.  Patient will be given cautious fluids as well.  Plan to admit to medicine.  Abdomen is soft with no  signs of  intra-abdominal pathology.  Lab work sent.  Labs show persistent lymphopenia.  Hemoglobin is improved from recent transfusion.  CMP with mild dehydration.  Lactic acid normal.  Chest x-ray reviewed and shows multifocal pneumonia.  Will admit to medicine for management of leukopenic pneumonia with fever.  Covid pending.  ____________________________________________  FINAL CLINICAL IMPRESSION(S) / ED DIAGNOSES  Final diagnoses:  Sepsis due to pneumonia ([place])  Leukemia not having achieved remission, unspecified leukemia type ([place])  Acute respiratory failure with hypoxemia ([place])     MEDICATIONS GIVEN DURING THIS VISIT:  Medications  lactated ringers infusion (has no administration in time range)  lactated ringers bolus 1,000 mL (1,000 mLs Intravenous New Bag/Given 04/12/2020 1828)  vancomycin (VANCOCIN) IVPB 1000 mg/200 mL premix (1,000 mg Intravenous New Bag/Given 04/26/2020 1854)  vancomycin (VANCOCIN) IVPB 750 mg/150 ml premix (has no administration in time range)  metroNIDAZOLE (FLAGYL) IVPB 500 mg (500 mg Intravenous New Bag/Given 05/04/2020 1832)  ceFEPIme (MAXIPIME) 2 g in sodium chloride 0.9 % 100 mL IVPB (0 g Intravenous Stopped 04/30/2020 1853)  acetaminophen (TYLENOL) tablet 650 mg (650 mg Oral Given 05/10/2020 1827)     ED Discharge Orders    None       Note:  This document was prepared using Dragon voice recognition software and may include unintentional dictation errors.   Duffy Bruce, MD 05/10/2020 Pauline Aus

## 2020-04-13 NOTE — ED Triage Notes (Signed)
Patient presents with sob and fever that started yesterday. HX leukemia and laryngeal cancer. Reports currently receiving radiation. Son present with patient and was told to come to ER if running fever. Baseline can walk around at home without walker. A&O x4 during triage

## 2020-04-13 NOTE — Progress Notes (Signed)
[facility]link monitoring sepsis protocol

## 2020-04-13 NOTE — Consult Note (Signed)
Pharmacy Antibiotic Note  Thomas Morton is a 87 y.o. male admitted on 05/04/2020 with weakness and fever. Patient with PMH significant for both laryngeal cancer (receiving radiation therapy) and acute leukemia (not an active treatment). Patient with severe neutropenia (ANC 100) and one time fever reading of 100.6. chest imaging c/f concerning for multifocal pneumonia, mildly worsened in the interval. .  Pharmacy has been consulted for vancomycin dosing for PNA. febrile neutropenia.   Plan:  Vancomycin 1750 mg IV Loading dose given X 1 in ED  Patient appears to be in AKI (Scr 1.7, up from 1.32), will hold off scheduling maintenance regimen at this time  anticipate Q48H regimen with current renal function  Follow up cultures, fever/ANC curve  Monitor renal function for dose adjustments  Height: 5\' 9"  (175.3 cm) Weight: 75.8 kg (167 lb) IBW/kg (Calculated) : 70.7  Temp (24hrs), Avg:99.8 F (37.7 C), Min:98.9 F (37.2 C), Max:100.6 F (38.1 C)  Recent Labs  Lab 04/08/20 1038 04/11/20 1031 04/12/20 1311 04/26/2020 1806 04/22/2020 1956 05/10/2020 2000  WBC 1.4* 1.8* 1.6* 1.8*  --   --   CREATININE  --   --   --  1.70*  --   --   LATICACIDVEN  --   --   --   --  1.9 1.2    Estimated Creatinine Clearance: 28.9 mL/min (A) (by C-G formula based on SCr of 1.7 mg/dL (H)).    Allergies  Allergen Reactions  . Sulfa Antibiotics Rash and Shortness Of Breath    Per patient low doses do not cause issues, only higher doses Pt reports can tolerate low doses of sulfa drugs Pt reports can tolerate low doses of sulfa drugs Per patient low doses do not cause issues, only higher doses   . Levofloxacin Other (See Comments)    Low Blood sugar, Syncope    Antimicrobials this admission: 4/2 cefepime >>  4/2 vancomycin >>  4/2 flagyl >>   Dose adjustments this admission: n/a  Microbiology results: 4/2 BCx: sent 4/2 UCx: sent 4/2 Asp Ag, BAL/Serum: sent    Thank you for allowing pharmacy  to be a part of this patient's care.  Dorothe Pea, PharmD, BCPS Clinical Pharmacist  05/05/2020 9:59 PM

## 2020-04-13 NOTE — ED Notes (Signed)
Patient is resting comfortably. 

## 2020-04-13 NOTE — ED Notes (Signed)
Family updated as to patient's status.

## 2020-04-13 NOTE — Consult Note (Signed)
PHARMACY -  BRIEF ANTIBIOTIC NOTE   Pharmacy has received consult(s) for cefepime, vancomycin from an ED provider.  The patient's profile has been reviewed for ht/wt/allergies/indication/available labs.    One time order(s) placed for   Cefepime 2 gram  Vancomycin 1750 mg   Further antibiotics/pharmacy consults should be ordered by admitting physician if indicated.                       Thank you, Dorothe Pea, PharmD, BCPS Clinical Pharmacist  05/04/2020  6:38 PM

## 2020-04-13 NOTE — ED Notes (Signed)
Pt placed on 2L, O2 89-90% on RA. Dr. Myrene Buddy, MD made aware.

## 2020-04-13 NOTE — H&P (Signed)
[facility] History and Physical  KAUAN KLOOSTERMAN MDY:[id number] DOB: 08-Sep-1927 DOA: 04/30/2020  Referring physician: Dr. Ellender Hose PCP: Rusty Aus, MD   Chief Complaint: weakness  HPI: Thomas Morton is a 87 y.o. male with history of A. fib, CVA, hypertension, systolic CHF, left bundle branch block, emphysema, recent diagnosis of both laryngeal cancer (receiving radiation therapy) and acute leukemia (not an active treatment), recent admission at the end of February 2022 for septic shock, who presents with weakness.  Patient diagnosed with laryngeal cancer in January of this year and currently undergoing radiation therapy.  During his hospitalization in February he was noted to be pancytopenic and a bone marrow biopsy revealed an additional diagnosis of acute leukemia.  He was seen yesterday at his oncologist office for blood transfusion due to his severe fatigue.  While there he was noted to have a temperature of 99.9 and evaluated by Onc NP, appeared stable at that time and was given a blood transfusion.  Was also continued on his already previously prescribed antimicrobial prophylaxis which includes Diflucan, Valtrex, Levaquin.  Patient never developed fever though notably he was neutropenic at that time.  Patient interviewed at bedside with his son present, they report that they came to the ED because there seemed to be no improvement to his general fatigue with the blood transfusion and in addition today he became short of breath.  Patient reports no chest pain but has had progressive shortness of breath and also become very congested and phlegmy.  In the ED initial vital signs notable for temperature of 100.6 Fahrenheit, tachycardia to the 1 teens that improved to the 90s, blood pressures normal ranging low 100s to 120s over 40s to 90s.  In addition initially quite tachypneic at 30 but this had improved to the low 20s to normal later on.  Lab work-up notable for CBC showing white count  of 1.8, hemoglobin 8.8, platelets 31, all basically unchanged from last check.  Differential notable for absolute neutrophil count of 100, remainder values normal.  Blood cultures x2 were obtained, Covid test was negative.  Chest x-ray showed a worsening opacity in the right lower lobe of the lung along with other findings concerning for multifocal pneumonia.  He was started on broad-spectrum antibiotics including vancomycin, cefepime, and Flagyl, and given a 1 L LR bolus and placed on continuous LR infusion.  He was admitted for further management.  Review of Systems:  Pertinent positives and negative per HPI, all others reviewed and negative  Past Medical History:  Diagnosis Date  . Afib ([place])   . Aortic atherosclerosis ([place])   . Arthritis   . B12 deficiency   . Bilateral carotid artery stenosis   . Cancer [facility])    prostate 2001  . Cardiac defibrillator in situ   . CHF (congestive heart failure) ([place])   . Chronic gouty arthritis   . Diabetes mellitus without complication ([place])   . Diabetic sensorimotor neuropathy ([place])   . Emphysema lung ([place])   . GERD (gastroesophageal reflux disease)   . History of cardioembolic cerebrovascular accident (CVA)   . History of DVT (deep vein thrombosis)   . History of kidney stones    H/O  . History of prostatectomy   . Hyperlipidemia   . LBBB (left bundle branch block)   . NICM (nonischemic cardiomyopathy) [facility])    Past Surgical History:  Procedure Laterality Date  . CARDIAC DEFIBRILLATOR PLACEMENT    . DIRECT LARYNGOSCOPY N/A 01/31/2020   Procedure: MICRO  DIRECT LARYNGOSCOPYWITH BIOPSY;  Surgeon: Clyde Canterbury, MD;  Location: [facility] ORS;  Service: ENT;  Laterality: N/A;  . eyelid surgery    . KNEE ARTHROSCOPY Bilateral   . PROSTATECTOMY     Social History:  reports that he quit smoking about 52 years ago. His smoking use included cigarettes. He has a 30.00 pack-year smoking history. He has never used smokeless tobacco. He reports current  alcohol use of about 7.0 standard drinks of alcohol per week. He reports that he does not use drugs.  Allergies  Allergen Reactions  . Sulfa Antibiotics Rash and Shortness Of Breath    Per patient low doses do not cause issues, only higher doses Pt reports can tolerate low doses of sulfa drugs Pt reports can tolerate low doses of sulfa drugs Per patient low doses do not cause issues, only higher doses   . Levofloxacin Other (See Comments)    Low Blood sugar, Syncope    Family History  Problem Relation Age of Onset  . Throat cancer Mother   . Diabetes Father      Prior to Admission medications   Medication Sig Start Date End Date Taking? Authorizing Provider  acetaminophen (TYLENOL) 650 MG suppository Place 1 suppository (650 mg total) rectally every 6 (six) hours as needed for mild pain or fever. 03/14/20   Loletha Grayer, MD  albuterol (PROVENTIL) (2.5 MG/3ML) 0.083% nebulizer solution Take 3 mLs (2.5 mg total) by nebulization every 4 (four) hours as needed for wheezing or shortness of breath. 03/14/20   Loletha Grayer, MD  allopurinol (ZYLOPRIM) 300 MG tablet Take 300 mg by mouth at bedtime. 02/13/15   [provider]  aspirin EC 325 MG EC tablet Take 1 tablet (325 mg total) by mouth daily. Patient not taking: Reported on 03/27/2020 10/16/15   Theodoro Grist, MD  atorvastatin (LIPITOR) 10 MG tablet Take 10 mg by mouth daily. Patient not taking: Reported on 03/27/2020 01/26/20   [provider]  Carboxymethylcellulose Sodium 0.25 % SOLN Administer 1 drop to both eyes Three (3) times a day. 03/18/20   [provider]  carvedilol (COREG) 3.125 MG tablet     [provider]  cefdinir (OMNICEF) 300 MG capsule Take by mouth. 03/18/20   [provider]  chlorhexidine (PERIDEX) 0.12 % solution 15 mLs by Mouth Rinse route 2 (two) times daily. Patient not taking: Reported on 03/27/2020 03/14/20   Loletha Grayer, MD  feeding supplement (ENSURE ENLIVE /  ENSURE PLUS) LIQD Take 237 mLs by mouth 2 (two) times daily between meals. 03/14/20   Loletha Grayer, MD  fluconazole (DIFLUCAN) 200 MG tablet Take by mouth. 03/18/20   [provider]  furosemide (LASIX) 20 MG tablet Take by mouth. 03/26/20 03/26/21  [provider]  levofloxacin (LEVAQUIN) 250 MG tablet Take 500 mg (2 tablets) on the first day then 250 mg (1 tablet) every 24 hours. 03/28/20   Lequita Asal, MD  sucralfate (CARAFATE) 1 g tablet Take 1 tablet (1 g total) by mouth 3 (three) times daily. Dissolve in 3-4 tbsp warm water, swish and swallow. For sore throat related to radiation. 04/12/20   Verlon Au, NP  valACYclovir (VALTREX) 500 MG tablet Take by mouth. 03/18/20   [provider]   Physical Exam: Vitals:   04/28/2020 1930 04/27/2020 2000 05/10/2020 2030 05/02/2020 2038  BP: (!) 106/92 (!) 106/54 (!) 105/48   Pulse: 99 96 95 96  Resp: (!) 23 (!) 25 (!) 22 19  Temp:  98.9 F (37.2 C)  TempSrc:    Oral  SpO2: 100% 99% 99% 99%  Weight:      Height:        Wt Readings from Last 3 Encounters:  04/29/2020 75.8 kg  03/27/20 79 kg  03/11/20 82.7 kg     . General:  Appears ill . Eyes: PERRL, normal lids, irises & conjunctiva . ENT: dry mucous membranes . Neck: erythematous thickened appearing skin . Cardiovascular: RRR, no m/r/g. No LE edema. . Telemetry: SR, no arrhythmias  . Respiratory: Moderate work of breathing.  Coarse rhonchorous breath sounds throughout . Abdomen: soft, ntnd . Skin: Numerous skin tears, bruises, senile purpura . Musculoskeletal: grossly normal tone BUE/BLE . Neurologic: grossly non-focal.          Labs on Admission:  Basic Metabolic Panel: Recent Labs  Lab 04/29/2020 1806  NA 136  K 4.3  CL 100  CO2 27  GLUCOSE 236*  BUN 34*  CREATININE 1.70*  CALCIUM 8.8*   Liver Function Tests: Recent Labs  Lab 04/12/2020 1806  AST 20  ALT 12  ALKPHOS 69  BILITOT 0.8  PROT 6.9  ALBUMIN 3.2*   No results for input(s):  LIPASE, AMYLASE in the last 168 hours. No results for input(s): AMMONIA in the last 168 hours. CBC: Recent Labs  Lab 04/08/20 1038 04/11/20 1031 04/12/20 1311 05/04/2020 1806  WBC 1.4* 1.8* 1.6* 1.8*  NEUTROABS 0.2* 0.0* 0.1* 0.1*  HGB 8.5* 8.1* 7.4* 8.8*  HCT 24.4* 23.8* 22.4* 26.2*  MCV 99.6 100.0 100.4* 98.1  PLT 38* 36* 28* 31*   Cardiac Enzymes: No results for input(s): CKTOTAL, CKMB, CKMBINDEX, TROPONINI in the last 168 hours.  BNP (last 3 results) Recent Labs    03/11/20 1024 04/25/2020 1806  BNP 119.2* 63.1    ProBNP (last 3 results) No results for input(s): PROBNP in the last 8760 hours.  CBG: No results for input(s): GLUCAP in the last 168 hours.  Radiological Exams on Admission: DG Chest Port 1 View  Result Date: 05/05/2020 CLINICAL DATA:  Questionable sepsis.  Shortness of breath and fever. EXAM: PORTABLE CHEST 1 VIEW COMPARISON:  March 11, 2020 FINDINGS: Continued infiltrate in the right upper lobe. Mild increased opacity in the right base. Small left effusion. Mild opacity in the left base seen as well, unchanged. The cardiomediastinal silhouette is stable. No pneumothorax. No other acute abnormalities. IMPRESSION: Persistent infiltrate in the right upper lobe. Increasing opacity in the right lower lobe. Stable mild opacity in left base may represent atelectasis. The findings are still concerning for multifocal pneumonia, mildly worsened in the interval. However, the opacity in the right upper lobe has been present for greater than a month. CT imaging could further evaluate as clinically warranted. Otherwise, recommend treatment with a short-term follow-up chest x-ray to ensure resolution. Electronically Signed   By: Dorise Bullion III M.D   On: 05/09/2020 18:30    EKG: Independently reviewed.  Appears to be sinus tachycardia.  Interventricular conduction block with some features of both left and right bundle branch block.  No convincing ST segment deviations to  suggest ischemia.  When compared to recent priors there is now a Q-wave in 3 and aVF that was not previously seen over the last several years.  However when going back to EKG from 2017 the morphology is the same.  There is similarly a reversal of QRS complexes in 1 and aVL.  Possible limb lead reversal.  Assessment/Plan Active Problems:   Chronic  renal insufficiency   Essential hypertension   Aspiration pneumonia (HCC)   History of cardioembolic cerebrovascular accident (CVA)   Atrial fibrillation, chronic (HCC)   Pancytopenia (HCC)   Laryngeal cancer (HCC)   Chronic systolic CHF (congestive heart failure) ([place])   Acute myeloid leukemia not having achieved remission (HCC)   Cardiac defibrillator in situ   History of prostate cancer   LBBB (left bundle branch block)   Mild emphysema (HCC)   Sepsis ([place])   #Severe sepsis #Likely aspiration pneumonia #Neutropenic fever Patient presenting with sepsis physiology likely secondary to an aspiration pneumonia in the right lower lobe complicated by significant neutropenia.  Technically only borderline for neutropenic fever diagnosis however given presentation will treat as such.  Started on broad-spectrum antibiotics and given fluid resuscitation, although cautiously given history of heart failure.  Low suspicion for fungal or viral etiology at this point, defer treatment for these possibilities at this time. -Cefepime 2 g every 8 hours -Flagyl every 8 hours -Vancomycin per pharmacy protocol -Follow-up urine and blood cultures -Follow-up procalcitonin -LR at 150 cc an hour -DuoNebs as needed  #Laryngeal cancer #Acute leukemia #Pancytopenia Cell counts at baseline.  Thrombocytopenia borderline for platelet transfusion given sepsis presentation, but will defer for now. -Consult heme-onc in the morning -Viscous letter for comfort as needed  #Known medical problems Gout-hold allopurinol, reports no gout events for many years, likely not in  line with goals of care Hypertension-hold home carvedilol and Lasix in setting of sepsis  Code Status: DNR/DNI, confirmed DVT Prophylaxis: None given significant thrombocytopenia Family Communication: Son updated at bedside Disposition Plan: Inpatient, MedSurg  Time spent: 68 min  Clarnce Flock MD/[facility]  Note:  This document was prepared using Systems analyst and may include unintentional dictation errors.

## 2020-04-13 NOTE — Consult Note (Signed)
CODE SEPSIS - PHARMACY COMMUNICATION  **Broad Spectrum Antibiotics should be administered within 1 hour of Sepsis diagnosis**  Time Code Sepsis Called/Page Received: 1756  Antibiotics Ordered: cefepime, vancomycin, metronidazole  Time of 1st antibiotic administration: 1828  Additional action taken by pharmacy: n/a  If necessary, Name of Provider/Nurse Contacted: [place] ,PharmD Clinical Pharmacist  04/26/2020  6:03 PM

## 2020-04-14 ENCOUNTER — Encounter: Payer: Self-pay | Admitting: Family Medicine

## 2020-04-14 ENCOUNTER — Inpatient Hospital Stay: Payer: Medicare HMO

## 2020-04-14 DIAGNOSIS — J690 Pneumonitis due to inhalation of food and vomit: Secondary | ICD-10-CM | POA: Diagnosis not present

## 2020-04-14 DIAGNOSIS — N179 Acute kidney failure, unspecified: Secondary | ICD-10-CM

## 2020-04-14 DIAGNOSIS — C9290 Myeloid leukemia, unspecified, not having achieved remission: Secondary | ICD-10-CM

## 2020-04-14 DIAGNOSIS — R509 Fever, unspecified: Secondary | ICD-10-CM | POA: Diagnosis not present

## 2020-04-14 DIAGNOSIS — C9200 Acute myeloblastic leukemia, not having achieved remission: Secondary | ICD-10-CM

## 2020-04-14 DIAGNOSIS — A419 Sepsis, unspecified organism: Secondary | ICD-10-CM | POA: Diagnosis not present

## 2020-04-14 DIAGNOSIS — J9601 Acute respiratory failure with hypoxia: Secondary | ICD-10-CM | POA: Diagnosis not present

## 2020-04-14 DIAGNOSIS — C329 Malignant neoplasm of larynx, unspecified: Secondary | ICD-10-CM

## 2020-04-14 DIAGNOSIS — D709 Neutropenia, unspecified: Secondary | ICD-10-CM

## 2020-04-14 DIAGNOSIS — I482 Chronic atrial fibrillation, unspecified: Secondary | ICD-10-CM

## 2020-04-14 DIAGNOSIS — J189 Pneumonia, unspecified organism: Secondary | ICD-10-CM

## 2020-04-14 DIAGNOSIS — R059 Cough, unspecified: Secondary | ICD-10-CM | POA: Diagnosis not present

## 2020-04-14 DIAGNOSIS — R5081 Fever presenting with conditions classified elsewhere: Secondary | ICD-10-CM

## 2020-04-14 LAB — URINE CULTURE: Culture: NO GROWTH

## 2020-04-14 LAB — COMPREHENSIVE METABOLIC PANEL
ALT: 12 U/L (ref 0–44)
AST: 16 U/L (ref 15–41)
Albumin: 2.7 g/dL — ABNORMAL LOW (ref 3.5–5.0)
Alkaline Phosphatase: 55 U/L (ref 38–126)
Anion gap: 9 (ref 5–15)
BUN: 31 mg/dL — ABNORMAL HIGH (ref 8–23)
CO2: 26 mmol/L (ref 22–32)
Calcium: 8.1 mg/dL — ABNORMAL LOW (ref 8.9–10.3)
Chloride: 102 mmol/L (ref 98–111)
Creatinine, Ser: 1.61 mg/dL — ABNORMAL HIGH (ref 0.61–1.24)
GFR, Estimated: 40 mL/min — ABNORMAL LOW (ref >60)
Glucose, Bld: 240 mg/dL — ABNORMAL HIGH (ref 70–99)
Potassium: 4.1 mmol/L (ref 3.5–5.1)
Sodium: 137 mmol/L (ref 135–145)
Total Bilirubin: 0.7 mg/dL (ref 0.3–1.2)
Total Protein: 6.1 g/dL — ABNORMAL LOW (ref 6.5–8.1)

## 2020-04-14 LAB — CBC
HCT: 23.3 % — ABNORMAL LOW (ref 39.0–52.0)
Hemoglobin: 7.8 g/dL — ABNORMAL LOW (ref 13.0–17.0)
MCH: 33.2 pg (ref 26.0–34.0)
MCHC: 33.5 g/dL (ref 30.0–36.0)
MCV: 99.1 fL (ref 80.0–100.0)
Platelets: 23 10*3/uL — CL (ref 150–400)
RBC: 2.35 MIL/uL — ABNORMAL LOW (ref 4.22–5.81)
RDW: 20 % — ABNORMAL HIGH (ref 11.5–15.5)
WBC: 1.4 10*3/uL — CL (ref 4.0–10.5)
nRBC: 0 % (ref 0.0–0.2)

## 2020-04-14 LAB — TYPE AND SCREEN
ABO/RH(D): B POS
Antibody Screen: NEGATIVE
Unit division: 0

## 2020-04-14 LAB — PROCALCITONIN: Procalcitonin: 0.14 ng/mL

## 2020-04-14 LAB — BPAM RBC
Blood Product Expiration Date: 202204262359
ISSUE DATE / TIME: 202204011256
Unit Type and Rh: 7300

## 2020-04-14 LAB — CORTISOL-AM, BLOOD: Cortisol - AM: 38.9 ug/dL — ABNORMAL HIGH (ref 6.7–22.6)

## 2020-04-14 MED ORDER — ENSURE ENLIVE PO LIQD
237.0000 mL | Freq: Two times a day (BID) | ORAL | Status: DC
  Administered 2020-04-15: 237 mL via ORAL

## 2020-04-14 MED ORDER — VALACYCLOVIR HCL 500 MG PO TABS
500.0000 mg | ORAL_TABLET | Freq: Every day | ORAL | Status: DC
  Administered 2020-04-14 – 2020-04-18 (×5): 500 mg via ORAL
  Filled 2020-04-14 (×5): qty 1

## 2020-04-14 MED ORDER — SODIUM CHLORIDE 0.9 % IV SOLN
2.0000 g | Freq: Two times a day (BID) | INTRAVENOUS | Status: DC
  Administered 2020-04-14 – 2020-04-15 (×4): 2 g via INTRAVENOUS
  Filled 2020-04-14 (×5): qty 2

## 2020-04-14 MED ORDER — PHENOL 1.4 % MT LIQD
1.0000 | OROMUCOSAL | Status: DC | PRN
  Filled 2020-04-14: qty 177

## 2020-04-14 MED ORDER — VANCOMYCIN HCL 10 G IV SOLR
1750.0000 mg | INTRAVENOUS | Status: DC
  Filled 2020-04-14: qty 1750

## 2020-04-14 MED ORDER — MORPHINE SULFATE (PF) 2 MG/ML IV SOLN
2.0000 mg | Freq: Once | INTRAVENOUS | Status: AC
Start: 2020-04-14 — End: 2020-04-14
  Administered 2020-04-14: 2 mg via INTRAVENOUS
  Filled 2020-04-14: qty 1

## 2020-04-14 MED ORDER — GLYCOPYRROLATE 0.2 MG/ML IJ SOLN
0.4000 mg | Freq: Four times a day (QID) | INTRAMUSCULAR | Status: DC | PRN
  Administered 2020-04-14 – 2020-04-18 (×7): 0.4 mg via INTRAVENOUS
  Filled 2020-04-14 (×7): qty 2

## 2020-04-14 MED ORDER — LACTATED RINGERS IV SOLN: INTRAVENOUS | Status: DC

## 2020-04-14 MED ORDER — CARVEDILOL 3.125 MG PO TABS
3.1250 mg | ORAL_TABLET | Freq: Two times a day (BID) | ORAL | Status: DC
  Administered 2020-04-14 – 2020-04-15 (×3): 3.125 mg via ORAL
  Filled 2020-04-14 (×3): qty 1

## 2020-04-14 MED ORDER — ALLOPURINOL 100 MG PO TABS
100.0000 mg | ORAL_TABLET | Freq: Every day | ORAL | Status: DC
  Administered 2020-04-14 – 2020-04-15 (×2): 100 mg via ORAL
  Filled 2020-04-14 (×2): qty 1

## 2020-04-14 MED ORDER — METHYLPREDNISOLONE SODIUM SUCC 40 MG IJ SOLR
40.0000 mg | Freq: Every day | INTRAMUSCULAR | Status: DC
  Administered 2020-04-14 – 2020-04-16 (×3): 40 mg via INTRAVENOUS
  Filled 2020-04-14 (×3): qty 1

## 2020-04-14 MED ORDER — IPRATROPIUM-ALBUTEROL 0.5-2.5 (3) MG/3ML IN SOLN
3.0000 mL | Freq: Four times a day (QID) | RESPIRATORY_TRACT | Status: DC
  Administered 2020-04-14 – 2020-04-16 (×8): 3 mL via RESPIRATORY_TRACT
  Filled 2020-04-14 (×7): qty 3

## 2020-04-14 NOTE — Consult Note (Signed)
[facility] CONSULT NOTE  Patient Care Team: Rusty Aus, MD as PCP - General (Internal Medicine)  CHIEF COMPLAINTS/PURPOSE OF CONSULTATION: Neutropenic fever/acute myeloid leukemia/laryngeal cancer  HISTORY OF PRESENTING ILLNESS:  Thomas Morton 87 y.o.  male with multiple medical problems including A. Fib; history of CHF defibrillator; diabetes/history of CVA/DVT-has been recently diagnosed with stage II laryngeal cancer; and also diagnosed with acute myeloid leukemia.  Patient has been evaluated at [facility] February 2022-with acute myeloid leukemia-currently transfuse as needed/supportive care.  Patient is currently undergoing radiation-for his laryngeal cancer.  Patient follows up with Dr.Corcoran.    Patient recently received PRBC transfusion last week.  Family noted patient has progressive getting weaker also noted to have worsening cough/difficulty swallowing and noted to have thick secretions.  Of note patient is on acyclovir; fluconazole; Levaquin renally dosed.   Review of Systems  Unable to perform ROS: Other  Patient able to talk because of thick secretions/did not be used suction  MEDICAL HISTORY:  Past Medical History:  Diagnosis Date  . Afib ([place])   . Aortic atherosclerosis ([place])   . Arthritis   . B12 deficiency   . Bilateral carotid artery stenosis   . Cancer [facility])    prostate 2001  . Cardiac defibrillator in situ   . CHF (congestive heart failure) ([place])   . Chronic gouty arthritis   . Diabetes mellitus without complication ([place])   . Diabetic sensorimotor neuropathy ([place])   . Emphysema lung ([place])   . GERD (gastroesophageal reflux disease)   . History of cardioembolic cerebrovascular accident (CVA)   . History of DVT (deep vein thrombosis)   . History of kidney stones    H/O  . History of prostatectomy   . Hyperlipidemia   . LBBB (left bundle branch block)   . NICM (nonischemic cardiomyopathy) ([place])     SURGICAL HISTORY: Past Surgical  History:  Procedure Laterality Date  . CARDIAC DEFIBRILLATOR PLACEMENT    . DIRECT LARYNGOSCOPY N/A 01/31/2020   Procedure: MICRO DIRECT LARYNGOSCOPYWITH BIOPSY;  Surgeon: Clyde Canterbury, MD;  Location: [facility] ORS;  Service: ENT;  Laterality: N/A;  . eyelid surgery    . KNEE ARTHROSCOPY Bilateral   . PROSTATECTOMY      SOCIAL HISTORY: Social History   Socioeconomic History  . Marital status: Widowed    Spouse name: Not on file  . Number of children: Not on file  . Years of education: Not on file  . Highest education level: Not on file  Occupational History  . Not on file  Tobacco Use  . Smoking status: Former Smoker    Packs/day: 1.00    Years: 30.00    Pack years: 30.00    Types: Cigarettes    Quit date: 01/25/1968    Years since quitting: 52.2  . Smokeless tobacco: Never Used  Vaping Use  . Vaping Use: Never used  Substance and Sexual Activity  . Alcohol use: Yes    Alcohol/week: 7.0 standard drinks    Types: 7 Glasses of wine per week    Comment: WINE EVERYDAY  . Drug use: Never  . Sexual activity: Not on file  Other Topics Concern  . Not on file  Social History Narrative  . Not on file   Social Determinants of Health   Financial Resource Strain: Not on file  Food Insecurity: Not on file  Transportation Needs: Not on file  Physical Activity: Not on file  Stress: Not on file  Social Connections: Not  on file  Intimate Partner Violence: Not on file    FAMILY HISTORY: Family History  Problem Relation Age of Onset  . Throat cancer Mother   . Diabetes Father     ALLERGIES:  is allergic to sulfa antibiotics and levofloxacin.  MEDICATIONS:  Current Facility-Administered Medications  Medication Dose Route Frequency Provider Last Rate Last Admin  . acetaminophen (TYLENOL) tablet 650 mg  650 mg Oral Q6H PRN Clarnce Flock, MD       Or  . acetaminophen (TYLENOL) suppository 650 mg  650 mg Rectal Q6H PRN Clarnce Flock, MD      . allopurinol (ZYLOPRIM)  tablet 100 mg  100 mg Oral Daily Loletha Grayer, MD   100 mg at 04/14/20 0943  . carvedilol (COREG) tablet 3.125 mg  3.125 mg Oral BID WC Loletha Grayer, MD   3.125 mg at 04/14/20 1642  . ceFEPIme (MAXIPIME) 2 g in sodium chloride 0.9 % 100 mL IVPB  2 g Intravenous Q12H Lu Duffel, RPH 200 mL/hr at 04/14/20 1307 2 g at 04/14/20 1307  . feeding supplement (ENSURE ENLIVE / ENSURE PLUS) liquid 237 mL  237 mL Oral BID BM Wieting, Richard, MD      . glycopyrrolate (ROBINUL) injection 0.4 mg  0.4 mg Intravenous Q6H PRN Loletha Grayer, MD   0.4 mg at 04/14/20 1636  . ipratropium-albuterol (DUONEB) 0.5-2.5 (3) MG/3ML nebulizer solution 3 mL  3 mL Nebulization Q6H Loletha Grayer, MD   3 mL at 04/14/20 1916  . lactated ringers infusion   Intravenous Continuous Loletha Grayer, MD 50 mL/hr at 04/14/20 1313 New Bag at 04/14/20 1313  . lidocaine (XYLOCAINE) 2 % viscous mouth solution 15 mL  15 mL Mouth/Throat Q4H PRN Clarnce Flock, MD      . methylPREDNISolone sodium succinate (SOLU-MEDROL) 40 mg/mL injection 40 mg  40 mg Intravenous Daily Loletha Grayer, MD   40 mg at 04/14/20 0942  . metroNIDAZOLE (FLAGYL) IVPB 500 mg  500 mg Intravenous Q8H Clarnce Flock, MD 100 mL/hr at 04/14/20 1643 500 mg at 04/14/20 1643  . ondansetron (ZOFRAN) tablet 4 mg  4 mg Oral Q6H PRN Clarnce Flock, MD       Or  . ondansetron [facility]) injection 4 mg  4 mg Intravenous Q6H PRN Clarnce Flock, MD      . oxyCODONE (Oxy IR/ROXICODONE) immediate release tablet 5 mg  5 mg Oral Q4H PRN Clarnce Flock, MD   5 mg at 04/14/20 1636  . phenol (CHLORASEPTIC) mouth spray 1 spray  1 spray Mouth/Throat PRN Wieting, Richard, MD      . polyethylene glycol (MIRALAX / GLYCOLAX) packet 17 g  17 g Oral Daily PRN Clarnce Flock, MD      . traZODone (DESYREL) tablet 25 mg  25 mg Oral QHS PRN Clarnce Flock, MD      . valACYclovir Estell Harpin) tablet 500 mg  500 mg Oral Daily Loletha Grayer, MD   500 mg at  04/14/20 1011  . [START ON 04/15/2020] vancomycin (VANCOCIN) 1,750 mg in sodium chloride 0.9 % 500 mL IVPB  1,750 mg Intravenous Q48H Shanlever, Pierce Crane, RPH          .  PHYSICAL EXAMINATION:  Vitals:   04/14/20 1623 04/14/20 2047  BP: (!) 107/59 (!) 93/57  Pulse: 98 91  Resp: 15   Temp: 98.7 F (37.1 C) 98.4 F (36.9 C)  SpO2: 98%    Filed Weights   05/11/2020  1757 04/14/20 0451  Weight: 167 lb (75.8 kg) 173 lb 8 oz (78.7 kg)    Physical Exam Constitutional:      Comments: Elderly Caucasian male patient resting in the bed.  He is on O2 nasal cannula.  Accompanied by 2 sons.  Frequently using suction to clear his oral secretions.  HENT:     Head: Normocephalic and atraumatic.     Mouth/Throat:     Pharynx: No oropharyngeal exudate.  Eyes:     Pupils: Pupils are equal, round, and reactive to light.  Cardiovascular:     Rate and Rhythm: Normal rate and regular rhythm.  Pulmonary:     Effort: No respiratory distress.     Breath sounds: No wheezing.     Comments: Decreased air entry bilaterally right more than left.  Coarse breath sounds. Abdominal:     General: Bowel sounds are normal. There is no distension.     Palpations: Abdomen is soft. There is no mass.     Tenderness: There is no abdominal tenderness. There is no guarding or rebound.  Musculoskeletal:        General: No tenderness. Normal range of motion.     Cervical back: Normal range of motion and neck supple.  Skin:    General: Skin is warm.  Neurological:     Mental Status: He is alert and oriented to person, place, and time.  Psychiatric:        Mood and Affect: Affect normal.      LABORATORY DATA:  I have reviewed the data as listed Lab Results  Component Value Date   WBC 1.4 (LL) 04/14/2020   HGB 7.8 (L) 04/14/2020   HCT 23.3 (L) 04/14/2020   MCV 99.1 04/14/2020   PLT 23 (LL) 04/14/2020   Recent Labs    03/12/20 0523 03/13/20 0526 05/02/2020 1806 04/14/20 0736  NA 137 133* 136 137  K  3.6 3.6 4.3 4.1  CL 109 104 100 102  CO2 23 25 27 26   GLUCOSE 176* 153* 236* 240*  BUN 25* 23 34* 31*  CREATININE 1.50* 1.32* 1.70* 1.61*  CALCIUM 7.3* 7.4* 8.8* 8.1*  GFRNONAA 44* 51* 38* 40*  PROT 5.3*  --  6.9 6.1*  ALBUMIN 2.5*  --  3.2* 2.7*  AST 20  --  20 16  ALT 12  --  12 12  ALKPHOS 40  --  69 55  BILITOT 0.7  --  0.8 0.7    RADIOGRAPHIC STUDIES: I have personally reviewed the radiological images as listed and agreed with the findings in the report. CT CHEST WO CONTRAST  Result Date: 04/14/2020 CLINICAL DATA:  Respiratory failure EXAM: CT CHEST WITHOUT CONTRAST TECHNIQUE: Multidetector CT imaging of the chest was performed following the standard protocol without IV contrast. COMPARISON:  Radiograph 04/18/2020, CT 03/30/2018 FINDINGS: Cardiovascular: LEFT-sided pacemaker with RIGHT heart leads. No pericardial effusion. Mediastinum/Nodes: No axillary or supraclavicular adenopathy. No mediastinal or hilar adenopathy. No pericardial fluid. Esophagus normal. Lungs/Pleura: Airspace opacity and with focal consolidation within the posterior aspect of the RIGHT upper lobe (images 37 through 56 series 3). Small foci of consolidation at the LEFT and RIGHT lung base (image 107). No pneumothorax. No suspicious nodularity. Upper Abdomen: Limited view of the liver, kidneys, pancreas are unremarkable. Normal adrenal glands. Musculoskeletal: No aggressive osseous lesion. IMPRESSION: 1. Consolidation and airspace disease in the posterior RIGHT upper lobe consistent with pneumonia versus aspiration pneumonitis. 2. Smaller foci of consolidation at the lung bases also concerning  for pneumonia versus pneumonitis. 3. No suspicious nodularity. 4. No mediastinal lymphadenopathy Electronically Signed   By: Suzy Bouchard M.D.   On: 04/14/2020 12:57   DG Chest Port 1 View  Result Date: 05/07/2020 CLINICAL DATA:  Questionable sepsis.  Shortness of breath and fever. EXAM: PORTABLE CHEST 1 VIEW COMPARISON:   March 11, 2020 FINDINGS: Continued infiltrate in the right upper lobe. Mild increased opacity in the right base. Small left effusion. Mild opacity in the left base seen as well, unchanged. The cardiomediastinal silhouette is stable. No pneumothorax. No other acute abnormalities. IMPRESSION: Persistent infiltrate in the right upper lobe. Increasing opacity in the right lower lobe. Stable mild opacity in left base may represent atelectasis. The findings are still concerning for multifocal pneumonia, mildly worsened in the interval. However, the opacity in the right upper lobe has been present for greater than a month. CT imaging could further evaluate as clinically warranted. Otherwise, recommend treatment with a short-term follow-up chest x-ray to ensure resolution. Electronically Signed   By: Dorise Bullion III M.D   On: 04/12/2020 18:30    Neutropenic fever [facility]) #87 year old male patient with acute myeloid leukemia; and laryngeal cancer stage II is currently admitted to hospital for fever/worsening cough shortness of breath.  #Neutropenic fever/underlying acute myeloid leukemia-chest x-ray suggestive of pneumonia.  Currently on on broad-spectrum antibiotics.  #Acute myeloid leukemia [diagnosed February 2022]-currently on supportive care [[facility]]  #Laryngeal cancer stage II-on radiation-difficulty swallowing/cough  #Difficulty swallowing-question radiation-induced esophagitis.    Recommendations:  #Given the persistent chest infiltrate/in the context of ongoing fevers-would recommend a CT scan noncontrast chest  #Continue-supportive care for AML-transfuse if hemoglobin less than 7; platelets 10 or less or signs of bleeding.   #Await speech pathology evaluation given the risk of aspiration.  #We will discuss with radiation oncology regarding giving a break during pts acute illness.   Thank you Dr.Wieting for allowing me to participate in the care of your pleasant patient. Please do not  hesitate to contact me with questions or concerns in the interim. discussed with Dr.Wieting.  The above plan of care was discussed with the patient/and his 2 sons in detail. Will inform Dr.Corcoran.    All questions were answered. The patient knows to call the clinic with any problems, questions or concerns.    Cammie Sickle, MD 04/14/2020 8:52 PM

## 2020-04-14 NOTE — Progress Notes (Addendum)
[phone] lab called critical wbc 1.4 plt 23. Notified MD wieting face to face at [phone] tele called pt switching back and forth between 1 and 2 BBB paged MD wieting

## 2020-04-14 NOTE — Plan of Care (Signed)
Continue to encourage progress

## 2020-04-14 NOTE — Progress Notes (Signed)
Patient ID: Thomas Morton, male   DOB: 1927-10-24, 87 y.o.   MRN: [id number] [facility] PROGRESS NOTE  Thomas Morton MCN:[id number] DOB: 18-Feb-1927 DOA: 05/01/2020 PCP: Rusty Aus, MD  HPI/Subjective: Patient not feeling well.  History of laryngeal cancer receiving treatment.  Recent diagnosis of acute leukemia.  Admitted with neutropenic fever.  Patient does have some cough and wheeze.  States he does not swallow very well but eats solid food.  Having some upper airway congestion.  Objective: Vitals:   04/14/20 0451 04/14/20 0747  BP: (!) 142/53 120/63  Pulse: (!) 111 (!) 118  Resp: (!) 22 17  Temp: 98.5 F (36.9 C) 99 F (37.2 C)  SpO2:  98%    Intake/Output Summary (Last 24 hours) at 04/14/2020 0758 Last data filed at 04/14/2020 0600 Gross per 24 hour  Intake 743.99 ml  Output --  Net 743.99 ml   Filed Weights   05/04/2020 1757 04/14/20 0451  Weight: 75.8 kg 78.7 kg    ROS: Review of Systems  Respiratory: Positive for cough, shortness of breath and wheezing.   Cardiovascular: Negative for chest pain.  Gastrointestinal: Negative for abdominal pain, nausea and vomiting.   Exam: Physical Exam HENT:     Head: Normocephalic.     Mouth/Throat:     Pharynx: No oropharyngeal exudate.  Eyes:     General: Lids are normal.     Conjunctiva/sclera: Conjunctivae normal.  Cardiovascular:     Rate and Rhythm: Tachycardia present. Rhythm irregularly irregular.     Heart sounds: Normal heart sounds, S1 normal and S2 normal.  Pulmonary:     Breath sounds: Transmitted upper airway sounds present. Examination of the right-middle field reveals decreased breath sounds and wheezing. Examination of the left-middle field reveals decreased breath sounds and wheezing. Examination of the right-lower field reveals decreased breath sounds and rhonchi. Examination of the left-lower field reveals decreased breath sounds and rhonchi. Decreased breath sounds, wheezing and rhonchi present.  No rales.  Abdominal:     Palpations: Abdomen is soft.     Tenderness: There is no abdominal tenderness.  Musculoskeletal:     Right lower leg: No swelling.     Left lower leg: No swelling.  Skin:    General: Skin is warm.     Findings: No rash.  Neurological:     Mental Status: He is alert and oriented to person, place, and time.       Data Reviewed: Basic Metabolic Panel: Recent Labs  Lab 05/03/2020 1806  NA 136  K 4.3  CL 100  CO2 27  GLUCOSE 236*  BUN 34*  CREATININE 1.70*  CALCIUM 8.8*   Liver Function Tests: Recent Labs  Lab 04/27/2020 1806  AST 20  ALT 12  ALKPHOS 69  BILITOT 0.8  PROT 6.9  ALBUMIN 3.2*   CBC: Recent Labs  Lab 04/08/20 1038 04/11/20 1031 04/12/20 1311 04/21/2020 1806  WBC 1.4* 1.8* 1.6* 1.8*  NEUTROABS 0.2* 0.0* 0.1* 0.1*  HGB 8.5* 8.1* 7.4* 8.8*  HCT 24.4* 23.8* 22.4* 26.2*  MCV 99.6 100.0 100.4* 98.1  PLT 38* 36* 28* 31*   BNP (last 3 results) Recent Labs    03/11/20 1024 04/17/2020 1806  BNP 119.2* 63.1      Recent Results (from the past 240 hour(s))  Culture, blood (Routine X 2) w Reflex to ID Panel     Status: None (Preliminary result)   Collection Time: 04/12/20  1:10 PM   Specimen: BLOOD  Result  Value Ref Range Status   Specimen Description   Final    BLOOD RIGHT ARM Performed at [facility], [address], [place], [place] [address]    Special Requests   Final    BOTTLES DRAWN AEROBIC AND ANAEROBIC Blood Culture adequate volume Performed at [facility], [address] [place], [place] [address]    Culture   Final    NO GROWTH 2 DAYS Performed at [facility], [address] [place], [place] [address]    Report Status PENDING  Incomplete  Culture, blood (Routine X 2) w Reflex to ID Panel     Status: None (Preliminary result)   Collection Time: 04/12/20  1:15 PM   Specimen: BLOOD  Result Value Ref Range Status   Specimen Description   Final    BLOOD LEFT ARM Performed at [facility], [address], [place], [place] [address]    Special Requests   Final    BOTTLES DRAWN AEROBIC AND ANAEROBIC Blood Culture adequate volume Performed at [facility], [address] [place], [place] [address]    Culture   Final    NO GROWTH 2 DAYS Performed at [facility], [address] [place], [place] [address]    Report Status PENDING  Incomplete  Resp Panel by RT-PCR (Flu A&B, Covid) Nasopharyngeal Swab     Status: None   Collection Time: 04/23/2020  5:56 PM   Specimen: Nasopharyngeal Swab; Nasopharyngeal(NP) swabs in vial transport medium  Result Value Ref Range Status   SARS Coronavirus 2 by RT PCR NEGATIVE NEGATIVE Final    Comment: (NOTE) SARS-CoV-2 target nucleic acids are NOT DETECTED.  The SARS-CoV-2 RNA is generally detectable in upper respiratory specimens during the acute phase of infection. The lowest concentration of SARS-CoV-2 viral copies this assay can detect is 138 copies/mL. A negative result does not preclude SARS-Cov-2 infection and should not be used as the sole basis for treatment or other patient management decisions. A negative result may occur with  improper specimen collection/handling, submission of specimen other than nasopharyngeal swab, presence of viral mutation(s) within the areas targeted by this assay, and inadequate number of viral copies(<138 copies/mL). A negative result must be combined with clinical observations, patient history, and epidemiological information. The expected result is Negative.  Fact Sheet for Patients:  EntrepreneurPulse.com.au  Fact Sheet for Healthcare Providers:  IncredibleEmployment.be  This test is no t yet approved or cleared by the [place] FDA and  has been authorized for detection and/or diagnosis of SARS-CoV-2 by FDA under an Emergency Use Authorization (EUA). This EUA will remain  in effect (meaning this test can be used) for the  duration of the COVID-19 declaration under Section 564(b)(1) of the Act, 21 U.S.C.section 360bbb-3(b)(1), unless the authorization is terminated  or revoked sooner.       Influenza A by PCR NEGATIVE NEGATIVE Final   Influenza B by PCR NEGATIVE NEGATIVE Final    Comment: (NOTE) The Xpert Xpress SARS-CoV-2/FLU/RSV plus assay is intended as an aid in the diagnosis of influenza from Nasopharyngeal swab specimens and should not be used as a sole basis for treatment. Nasal washings and aspirates are unacceptable for Xpert Xpress SARS-CoV-2/FLU/RSV testing.  Fact Sheet for Patients: EntrepreneurPulse.com.au  Fact Sheet for Healthcare Providers: IncredibleEmployment.be  This test is not yet approved or cleared by the [place] FDA and has been authorized for detection and/or diagnosis of SARS-CoV-2 by FDA under an Emergency Use Authorization (EUA). This  EUA will remain in effect (meaning this test can be used) for the duration of the COVID-19 declaration under Section 564(b)(1) of the Act, 21 U.S.C. section 360bbb-3(b)(1), unless the authorization is terminated or revoked.  Performed at [facility], [address] [place], [place] [address]   Blood Culture (routine x 2)     Status: None (Preliminary result)   Collection Time: 05/10/2020  6:06 PM   Specimen: BLOOD  Result Value Ref Range Status   Specimen Description BLOOD  RIGHT [facility]  Final   Special Requests   Final    BOTTLES DRAWN AEROBIC AND ANAEROBIC Blood Culture adequate volume   Culture   Final    NO GROWTH < 12 HOURS Performed at [facility], [address], [place], [place] [address]    Report Status PENDING  Incomplete  Blood Culture (routine x 2)     Status: None (Preliminary result)   Collection Time: 05/08/2020  6:07 PM   Specimen: BLOOD  Result Value Ref Range Status   Specimen Description BLOOD  RIGHT HAND  Final   Special Requests   Final    BOTTLES  DRAWN AEROBIC AND ANAEROBIC Blood Culture results may not be optimal due to an inadequate volume of blood received in culture bottles   Culture   Final    NO GROWTH < 12 HOURS Performed at [facility], [address] [place], [place] [address]    Report Status PENDING  Incomplete     Studies: DG Chest Port 1 View  Result Date: 04/28/2020 CLINICAL DATA:  Questionable sepsis.  Shortness of breath and fever. EXAM: PORTABLE CHEST 1 VIEW COMPARISON:  March 11, 2020 FINDINGS: Continued infiltrate in the right upper lobe. Mild increased opacity in the right base. Small left effusion. Mild opacity in the left base seen as well, unchanged. The cardiomediastinal silhouette is stable. No pneumothorax. No other acute abnormalities. IMPRESSION: Persistent infiltrate in the right upper lobe. Increasing opacity in the right lower lobe. Stable mild opacity in left base may represent atelectasis. The findings are still concerning for multifocal pneumonia, mildly worsened in the interval. However, the opacity in the right upper lobe has been present for greater than a month. CT imaging could further evaluate as clinically warranted. Otherwise, recommend treatment with a short-term follow-up chest x-ray to ensure resolution. Electronically Signed   By: Dorise Bullion III M.D   On: 05/04/2020 18:30    Scheduled Meds: . feeding supplement  237 mL Oral BID BM  . methylPREDNISolone (SOLU-MEDROL) injection  40 mg Intravenous Daily   Continuous Infusions: . ceFEPime (MAXIPIME) IV    . lactated ringers    . metronidazole 500 mg (04/14/20 0221)    Assessment/Plan:  1. Severe sepsis, present on admission with neutropenic fever and aspiration pneumonia, acute kidney injury, acute hypoxic respiratory failure.  Patient had fever, neutropenia, tachycardia and tachypnea on presentation started on aggressive antibiotics with vancomycin, Maxipime and Flagyl.  Follow-up cultures.  IV fluid hydration.  Add  nebulizer treatments and Solu-Medrol.  Oxygen supplementation. 2. Laryngeal cancer with upper airway congestion.  Will start on liquid diet and see how he does.  We will get swallow evaluation tomorrow.  Solu-Medrol.  Glycopyrrolate as needed for secretions. 3. AML.  Pancytopenia. not undergoing any treatment for this.  We will get a peripheral smear.  Renally dosed allopurinol 4. Acute kidney injury on chronic kidney disease stage IIIa.  IV fluid hydration 5. Chronic atrial fibrillation.  Restart Coreg since blood  pressure better this morning. 6. Chronic systolic congestive heart failure.  Decrease rate of fluids.  No signs of heart failure currently.  Restart Coreg and hold Lasix. 7. Type 2 diabetes mellitus with hyperlipidemia.  Hold cholesterol medication.  Sliding scale for right now last hemoglobin A1c 6.5. 8. Stage II coccyx decubiti, present on admission see description below 9. Patient is a DO NOT RESUSCITATE  Pressure Injury 03/12/20 Coccyx Mid Stage 2 -  Partial thickness loss of dermis presenting as a shallow open injury with a red, pink wound bed without slough. white (Active)  03/12/20 1844  Location: Coccyx  Location Orientation: Mid  Staging: Stage 2 -  Partial thickness loss of dermis presenting as a shallow open injury with a red, pink wound bed without slough.  Wound Description (Comments): white  Present on Admission: Yes       Code Status:     Code Status Orders  (From admission, onward)         Start     Ordered   05/02/2020 2159  Do not attempt resuscitation (DNR)  Continuous       Question Answer Comment  In the event of cardiac or respiratory ARREST Do not call a "code blue"   In the event of cardiac or respiratory ARREST Do not perform Intubation, CPR, defibrillation or ACLS   In the event of cardiac or respiratory ARREST Use medication by any route, position, wound care, and other measures to relive pain and suffering. May use oxygen, suction and manual  treatment of airway obstruction as needed for comfort.      05/06/2020 2159        Code Status History    Date Active Date Inactive Code Status Order ID Comments User Context   03/12/2020 0718 03/15/2020 0504 DNR [id number]  Flora Lipps, MD Inpatient   03/11/2020 2206 03/12/2020 0718 Partial Code [id number] Vasopressor administration only Rust-Chester, Huel Cote, NP Inpatient   03/11/2020 1926 03/11/2020 2206 Full Code [id number]  Ivor Costa, MD ED   03/11/2020 1301 03/11/2020 1926 Partial Code [id number] No CPR or intubation, but vassopressor OK per his son Ivor Costa, MD ED   03/11/2020 1301 03/11/2020 1301 Partial Code [id number]  Ivor Costa, MD ED   12/22/2017 1620 12/25/2017 2246 Full Code [id number]  Loletha Grayer, MD Inpatient   10/14/2015 1055 10/15/2015 1625 Full Code [id number]  Theodoro Grist, MD Inpatient   Advance Care Planning Activity    Advance Directive Documentation   Flowsheet Row Most Recent Value  Type of Advance Directive Healthcare Power of Attorney  Pre-existing out of facility DNR order (yellow form or pink MOST form) --  "MOST" Form in Place? --     Family Communication: Spoke with son on the phone Disposition Plan: Status is: Inpatient  Dispo: The patient is from: Home              Anticipated d/c is to: To be determined based on clinical course              Patient currently being treated for neutropenic fever and severe sepsis   Difficult to place patient.  Hopefully not.  Consultants:  Oncology  Antibiotics:  Vancomycin  Cefepime  Flagyl  Time spent: 37 minutes, case discussed with oncology  Marisel Tostenson [facility]  [facility]

## 2020-04-14 NOTE — Consult Note (Signed)
Pharmacy Antibiotic Note  Thomas Morton is a 87 y.o. male admitted on 04/28/2020 with weakness and fever. Patient with PMH significant for both laryngeal cancer (receiving radiation therapy) and acute leukemia (not an active treatment). Patient with severe neutropenia (ANC 100) and one time fever reading of 100.6. chest imaging c/f concerning for multifocal pneumonia, mildly worsened in the interval.    Pharmacy has been consulted for vancomycin dosing for PNA. febrile neutropenia.   Plan:  Vancomycin 1750 mg IV Loading dose given X 1 in ED  Patient AKI improving Scr 1.70>1.61  Follow up cultures, fever/ANC curve  Monitor renal function for dose adjustments  Will start Vancomycin 1750 mg IV Q 48 hrs.  Goal AUC 400-550. Expected AUC: 519 SCr used: 1.61  Will adjust Cefepime to 2g q12hr per CrCL > 26ml/min   Height: 5\' 9"  (175.3 cm) Weight: 78.7 kg (173 lb 8 oz) IBW/kg (Calculated) : 70.7  Temp (24hrs), Avg:99.1 F (37.3 C), Min:98.4 F (36.9 C), Max:100.6 F (38.1 C)  Recent Labs  Lab 04/08/20 1038 04/11/20 1031 04/12/20 1311 05/02/2020 1806 04/27/2020 1956 04/20/2020 2000 04/14/20 0736  WBC 1.4* 1.8* 1.6* 1.8*  --   --  1.4*  CREATININE  --   --   --  1.70*  --   --  1.61*  LATICACIDVEN  --   --   --   --  1.9 1.2  --     Estimated Creatinine Clearance: 30.5 mL/min (A) (by C-G formula based on SCr of 1.61 mg/dL (H)).    Allergies  Allergen Reactions  . Sulfa Antibiotics Rash and Shortness Of Breath    Per patient low doses do not cause issues, only higher doses Pt reports can tolerate low doses of sulfa drugs Pt reports can tolerate low doses of sulfa drugs Per patient low doses do not cause issues, only higher doses   . Levofloxacin Other (See Comments)    Low Blood sugar, Syncope    Antimicrobials this admission: 4/2 cefepime >>  4/2 vancomycin >>  4/2 flagyl >>   Dose adjustments this admission: n/a  Microbiology results: 4/2 BCx: NGTD 4/2 UCx:  cancelled 4/2 Asp Ag, BAL/Serum: sent    Thank you for allowing pharmacy to be a part of this patient's care.  Lu Duffel, PharmD, BCPS Clinical Pharmacist  04/14/2020 11:25 AM

## 2020-04-14 NOTE — Assessment & Plan Note (Addendum)
#  87 year old male patient with acute myeloid leukemia; and laryngeal cancer stage II is currently admitted to hospital for fever/worsening cough shortness of breath.  #Neutropenic fever/underlying acute myeloid leukemia-chest x-ray suggestive of pneumonia.  Currently on on broad-spectrum antibiotics.  #Acute myeloid leukemia [diagnosed February 2022]-currently on supportive care [[facility]]  #Laryngeal cancer stage II-on radiation-difficulty swallowing/cough  #Difficulty swallowing-question radiation-induced esophagitis.    Recommendations:  #Given the persistent chest infiltrate/in the context of ongoing fevers-would recommend a CT scan noncontrast chest  #Continue-supportive care for AML-transfuse if hemoglobin less than 7; platelets 10 or less or signs of bleeding.   #Await speech pathology evaluation given the risk of aspiration.  #We will discuss with radiation oncology regarding giving a break during pts acute illness.   Thank you Dr.Wieting for allowing me to participate in the care of your pleasant patient. Please do not hesitate to contact me with questions or concerns in the interim. discussed with Dr.Wieting.  The above plan of care was discussed with the patient/and his 2 sons in detail. Will inform Dr.Corcoran.

## 2020-04-15 ENCOUNTER — Inpatient Hospital Stay: Payer: Medicare HMO

## 2020-04-15 ENCOUNTER — Ambulatory Visit: Payer: Medicare HMO

## 2020-04-15 DIAGNOSIS — D709 Neutropenia, unspecified: Secondary | ICD-10-CM | POA: Diagnosis not present

## 2020-04-15 DIAGNOSIS — N179 Acute kidney failure, unspecified: Secondary | ICD-10-CM | POA: Diagnosis not present

## 2020-04-15 DIAGNOSIS — R509 Fever, unspecified: Secondary | ICD-10-CM | POA: Diagnosis not present

## 2020-04-15 DIAGNOSIS — R059 Cough, unspecified: Secondary | ICD-10-CM | POA: Diagnosis not present

## 2020-04-15 DIAGNOSIS — A419 Sepsis, unspecified organism: Secondary | ICD-10-CM | POA: Diagnosis not present

## 2020-04-15 DIAGNOSIS — C9290 Myeloid leukemia, unspecified, not having achieved remission: Secondary | ICD-10-CM | POA: Diagnosis not present

## 2020-04-15 DIAGNOSIS — C329 Malignant neoplasm of larynx, unspecified: Secondary | ICD-10-CM | POA: Diagnosis not present

## 2020-04-15 LAB — BASIC METABOLIC PANEL
Anion gap: 6 (ref 5–15)
BUN: 44 mg/dL — ABNORMAL HIGH (ref 8–23)
CO2: 26 mmol/L (ref 22–32)
Calcium: 8.1 mg/dL — ABNORMAL LOW (ref 8.9–10.3)
Chloride: 106 mmol/L (ref 98–111)
Creatinine, Ser: 1.41 mg/dL — ABNORMAL HIGH (ref 0.61–1.24)
GFR, Estimated: 47 mL/min — ABNORMAL LOW (ref >60)
Glucose, Bld: 415 mg/dL — ABNORMAL HIGH (ref 70–99)
Potassium: 4.3 mmol/L (ref 3.5–5.1)
Sodium: 138 mmol/L (ref 135–145)

## 2020-04-15 LAB — CBC
HCT: 23.4 % — ABNORMAL LOW (ref 39.0–52.0)
Hemoglobin: 7.9 g/dL — ABNORMAL LOW (ref 13.0–17.0)
MCH: 33.6 pg (ref 26.0–34.0)
MCHC: 33.8 g/dL (ref 30.0–36.0)
MCV: 99.6 fL (ref 80.0–100.0)
Platelets: 20 10*3/uL — CL (ref 150–400)
RBC: 2.35 MIL/uL — ABNORMAL LOW (ref 4.22–5.81)
RDW: 19.8 % — ABNORMAL HIGH (ref 11.5–15.5)
WBC: 0.6 10*3/uL — CL (ref 4.0–10.5)
nRBC: 0 % (ref 0.0–0.2)

## 2020-04-15 LAB — GLUCOSE, CAPILLARY
Glucose-Capillary: 535 mg/dL (ref 70–99)
Glucose-Capillary: 478 mg/dL — ABNORMAL HIGH (ref 70–99)

## 2020-04-15 LAB — PATHOLOGIST SMEAR REVIEW

## 2020-04-15 MED ORDER — INSULIN ASPART 100 UNIT/ML ~~LOC~~ SOLN
0.0000 [IU] | Freq: Every day | SUBCUTANEOUS | Status: DC
  Administered 2020-04-15: 5 [IU] via SUBCUTANEOUS
  Filled 2020-04-15: qty 1

## 2020-04-15 MED ORDER — INSULIN GLARGINE 100 UNIT/ML ~~LOC~~ SOLN
15.0000 [IU] | Freq: Every day | SUBCUTANEOUS | Status: DC
  Administered 2020-04-15 – 2020-04-16 (×2): 15 [IU] via SUBCUTANEOUS
  Filled 2020-04-15 (×2): qty 0.15

## 2020-04-15 MED ORDER — INSULIN ASPART 100 UNIT/ML ~~LOC~~ SOLN
0.0000 [IU] | Freq: Three times a day (TID) | SUBCUTANEOUS | Status: DC
  Administered 2020-04-15: 9 [IU] via SUBCUTANEOUS
  Filled 2020-04-15: qty 1

## 2020-04-15 MED ORDER — ADULT MULTIVITAMIN W/MINERALS CH
1.0000 | ORAL_TABLET | Freq: Every day | ORAL | Status: DC
  Administered 2020-04-15 – 2020-04-18 (×4): 1 via ORAL
  Filled 2020-04-15 (×4): qty 1

## 2020-04-15 MED ORDER — VANCOMYCIN HCL 1000 MG/200ML IV SOLN
1000.0000 mg | INTRAVENOUS | Status: DC
  Administered 2020-04-15: 1000 mg via INTRAVENOUS
  Filled 2020-04-15 (×2): qty 200

## 2020-04-15 NOTE — Progress Notes (Signed)
[facility] Hematology/Oncology Progress Note  Date of admission: 04/15/2020  Hospital day:  04/15/2020  Chief Complaint: Thomas Morton is a 87 y.o. 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  Subjective:  Patient notes sore throat.  Cough.  Social History: The patient is accompanied by his son today.  Allergies:  Allergies  Allergen Reactions  . Sulfa Antibiotics Rash and Shortness Of Breath    Per patient low doses do not cause issues, only higher doses Pt reports can tolerate low doses of sulfa drugs Pt reports can tolerate low doses of sulfa drugs Per patient low doses do not cause issues, only higher doses   . Levofloxacin Other (See Comments)    Low Blood sugar, Syncope    Scheduled Medications: . insulin aspart  0-5 Units Subcutaneous QHS  . insulin aspart  0-9 Units Subcutaneous TID WC  . insulin glargine  15 Units Subcutaneous Daily  . ipratropium-albuterol  3 mL Nebulization Q6H  . methylPREDNISolone (SOLU-MEDROL) injection  40 mg Intravenous Daily  . multivitamin with minerals  1 tablet Oral Daily  . valACYclovir  500 mg Oral Daily    Review of Systems  Constitutional: Positive for malaise/fatigue and weight loss. Negative for chills, diaphoresis and fever (improved).  HENT: Positive for sore throat (s/p XRT). Negative for congestion, ear pain, nosebleeds and sinus pain.   Eyes: Negative.  Negative for blurred vision, double vision, photophobia and pain.  Respiratory: Positive for cough and shortness of breath. Negative for hemoptysis.   Cardiovascular: Negative.  Negative for chest pain, palpitations and leg swelling.  Gastrointestinal: Negative.  Negative for abdominal pain, blood in stool, constipation, diarrhea, heartburn, melena, nausea and vomiting.  Genitourinary: Negative.  Negative for dysuria, frequency, hematuria and urgency.  Musculoskeletal: Negative.   Negative for falls and myalgias.  Skin: Negative.  Negative for rash.  Neurological: Positive for weakness (general). Negative for sensory change, speech change, focal weakness and headaches.  Endo/Heme/Allergies: Negative.  Does not bruise/bleed easily.  Psychiatric/Behavioral: Negative for depression and memory loss. The patient is not nervous/anxious and does not have insomnia.     Vitals: Blood pressure (!) 99/59, pulse 98, temperature (!) 97.5 F (36.4 C), temperature source Oral, resp. rate 18, height '5\' 9"'  (1.753 m), weight 173 lb 8 oz (78.7 kg), SpO2 98 %.   Physical Exam Vitals and nursing note reviewed.  Constitutional:      General: He is not in acute distress.    Appearance: He is not ill-appearing or toxic-appearing.     Comments: Fatigued appearing gentleman sitting up in bed eating dinner.  HENT:     Head: Normocephalic and atraumatic.     Mouth/Throat:     Pharynx: Oropharynx is clear.  Eyes:     Extraocular Movements: Extraocular movements intact.     Pupils: Pupils are equal, round, and reactive to light.  Cardiovascular:     Rate and Rhythm: Normal rate and regular rhythm.  Pulmonary:     Comments: Wet coarse breath sounds transmitted throughout. Abdominal:     General: Bowel sounds are normal.     Palpations: Abdomen is soft. There is no hepatomegaly, splenomegaly or mass.     Tenderness: There is no guarding or rebound.  Musculoskeletal:     Cervical back: Normal range of motion and neck supple.  Skin:    General: Skin is warm and dry.     Coloration: Skin  is not pale.     Findings: No erythema or rash.  Neurological:     General: No focal deficit present.     Mental Status: He is alert and oriented to person, place, and time.  Psychiatric:        Mood and Affect: Mood normal. Mood is not anxious.        Behavior: Behavior normal.     Results for orders placed or performed during the hospital encounter of 04/29/2020 (from the past 48 hour(s))  Lactic  acid, plasma     Status: None   Collection Time: 04/23/2020  7:56 PM  Result Value Ref Range   Lactic Acid, Venous 1.9 0.5 - 1.9 mmol/L    Comment: Performed at [facility], [address] [place], [place] [address]  Lactic acid, plasma     Status: None   Collection Time: 05/08/2020  8:00 PM  Result Value Ref Range   Lactic Acid, Venous 1.2 0.5 - 1.9 mmol/L    Comment: Performed at [facility], [address] [place], [place] [address]  Cortisol-am, blood     Status: Abnormal   Collection Time: 04/14/20  7:36 AM  Result Value Ref Range   Cortisol - AM 38.9 (H) 6.7 - 22.6 ug/dL    Comment: Performed at [facility], [address] [place], [place] [address]  Procalcitonin     Status: None   Collection Time: 04/14/20  7:36 AM  Result Value Ref Range   Procalcitonin 0.14 ng/mL    Comment:        Interpretation: PCT (Procalcitonin) <= 0.5 ng/mL: Systemic infection (sepsis) is not likely. Local bacterial infection is possible. (NOTE)       Sepsis PCT Algorithm           Lower Respiratory Tract                                      Infection PCT Algorithm    ----------------------------     ----------------------------         PCT < 0.25 ng/mL                PCT < 0.10 ng/mL          Strongly encourage             Strongly discourage   discontinuation of antibiotics    initiation of antibiotics    ----------------------------     -----------------------------       PCT 0.25 - 0.50 ng/mL            PCT 0.10 - 0.25 ng/mL               OR       >80% decrease in PCT            Discourage initiation of                                            antibiotics      Encourage discontinuation           of antibiotics    ----------------------------     -----------------------------         PCT >= 0.50 ng/mL  PCT 0.26 - 0.50 ng/mL               AND        <80% decrease in PCT             Encourage initiation of                                              antibiotics       Encourage continuation           of antibiotics    ----------------------------     -----------------------------        PCT >= 0.50 ng/mL                  PCT > 0.50 ng/mL               AND         increase in PCT                  Strongly encourage                                      initiation of antibiotics    Strongly encourage escalation           of antibiotics                                     -----------------------------                                           PCT <= 0.25 ng/mL                                                 OR                                        > 80% decrease in PCT                                      Discontinue / Do not initiate                                             antibiotics  Performed at [facility], [address] [place], [place] [address]   Comprehensive metabolic panel     Status: Abnormal   Collection Time: 04/14/20  7:36 AM  Result Value Ref Range   Sodium 137 135 - 145 mmol/L   Potassium 4.1 3.5 - 5.1 mmol/L   Chloride 102 98 - 111 mmol/L   CO2 26 22 - 32 mmol/L   Glucose, Bld 240 (H) 70 - 99 mg/dL  Comment: Glucose reference range applies only to samples taken after fasting for at least 8 hours.   BUN 31 (H) 8 - 23 mg/dL   Creatinine, Ser 1.61 (H) 0.61 - 1.24 mg/dL   Calcium 8.1 (L) 8.9 - 10.3 mg/dL   Total Protein 6.1 (L) 6.5 - 8.1 g/dL   Albumin 2.7 (L) 3.5 - 5.0 g/dL   AST 16 15 - 41 U/L   ALT 12 0 - 44 U/L   Alkaline Phosphatase 55 38 - 126 U/L   Total Bilirubin 0.7 0.3 - 1.2 mg/dL   GFR, Estimated 40 (L) >60 mL/min    Comment: (NOTE) Calculated using the CKD-EPI Creatinine Equation (2021)    Anion gap 9 5 - 15    Comment: Performed at [facility], [address] [place], [place] [address]  CBC     Status: Abnormal   Collection Time: 04/14/20  7:36 AM  Result Value Ref Range   WBC 1.4 (LL) 4.0 - 10.5 K/uL    Comment: WHITE COUNT CONFIRMED ON SMEAR THIS CRITICAL  RESULT HAS VERIFIED AND BEEN CALLED TO Maite Burlison HANNA BY MARISA CARVER ON 04 03 2022 AT 0846, AND HAS BEEN READ BACK.     RBC 2.35 (L) 4.22 - 5.81 MIL/uL   Hemoglobin 7.8 (L) 13.0 - 17.0 g/dL   HCT 23.3 (L) 39.0 - 52.0 %   MCV 99.1 80.0 - 100.0 fL   MCH 33.2 26.0 - 34.0 pg   MCHC 33.5 30.0 - 36.0 g/dL   RDW 20.0 (H) 11.5 - 15.5 %   Platelets 23 (LL) 150 - 400 K/uL    Comment: Immature Platelet Fraction may be clinically indicated, consider ordering this additional test [id number] THIS CRITICAL RESULT HAS VERIFIED AND BEEN CALLED TO Noam Karaffa HANNA BY MARISA CARVER ON 04 03 2022 AT 0846, AND HAS BEEN READ BACK.     nRBC 0.0 0.0 - 0.2 %    Comment: Performed at [facility], [address] [place], [place] [address]  Pathologist smear review     Status: None   Collection Time: 04/14/20  7:36 AM  Result Value Ref Range   Path Review Blood smear is reviewed.     Comment: Pancytopenia with rare circulating blasts, compatible with the patient's known acute leukemia. Reviewed by Elmon Kirschner, M.D. Performed at [facility], [address] [place], [place] [address]   Basic metabolic panel     Status: Abnormal   Collection Time: 04/15/20  5:14 AM  Result Value Ref Range   Sodium 138 135 - 145 mmol/L   Potassium 4.3 3.5 - 5.1 mmol/L   Chloride 106 98 - 111 mmol/L   CO2 26 22 - 32 mmol/L   Glucose, Bld 415 (H) 70 - 99 mg/dL    Comment: Glucose reference range applies only to samples taken after fasting for at least 8 hours.   BUN 44 (H) 8 - 23 mg/dL   Creatinine, Ser 1.41 (H) 0.61 - 1.24 mg/dL   Calcium 8.1 (L) 8.9 - 10.3 mg/dL   GFR, Estimated 47 (L) >60 mL/min    Comment: (NOTE) Calculated using the CKD-EPI Creatinine Equation (2021)    Anion gap 6 5 - 15    Comment: Performed at [facility] [address] [place], [place] [address]  CBC     Status: Abnormal   Collection Time: 04/15/20  5:15 AM  Result Value Ref Range   WBC 0.6 (LL) 4.0 - 10.5  K/uL    Comment: CRITICAL VALUE NOTED.  VALUE IS CONSISTENT WITH PREVIOUSLY REPORTED AND CALLED VALUE. WHITE COUNT CONFIRMED ON SMEAR    RBC 2.35 (L) 4.22 - 5.81 MIL/uL   Hemoglobin 7.9 (L) 13.0 - 17.0 g/dL   HCT 23.4 (L) 39.0 - 52.0 %   MCV 99.6 80.0 - 100.0 fL   MCH 33.6 26.0 - 34.0 pg   MCHC 33.8 30.0 - 36.0 g/dL   RDW 19.8 (H) 11.5 - 15.5 %   Platelets 20 (LL) 150 - 400 K/uL    Comment: Immature Platelet Fraction may be clinically indicated, consider ordering this additional test [id number] CRITICAL VALUE NOTED.  VALUE IS CONSISTENT WITH PREVIOUSLY REPORTED AND CALLED VALUE. PLATELET COUNT CONFIRMED BY SMEAR    nRBC 0.0 0.0 - 0.2 %    Comment: Performed at [facility], [address] [place], [place] [address]  Glucose, capillary     Status: Abnormal   Collection Time: 04/15/20  3:51 PM  Result Value Ref Range   Glucose-Capillary 535 (HH) 70 - 99 mg/dL    Comment: Glucose reference range applies only to samples taken after fasting for at least 8 hours.   Comment 1 Call MD NNP PA CNM    CT CHEST WO CONTRAST  Result Date: 04/14/2020 CLINICAL DATA:  Respiratory failure EXAM: CT CHEST WITHOUT CONTRAST TECHNIQUE: Multidetector CT imaging of the chest was performed following the standard protocol without IV contrast. COMPARISON:  Radiograph 05/05/2020, CT 03/30/2018 FINDINGS: Cardiovascular: LEFT-sided pacemaker with RIGHT heart leads. No pericardial effusion. Mediastinum/Nodes: No axillary or supraclavicular adenopathy. No mediastinal or hilar adenopathy. No pericardial fluid. Esophagus normal. Lungs/Pleura: Airspace opacity and with focal consolidation within the posterior aspect of the RIGHT upper lobe (images 37 through 56 series 3). Small foci of consolidation at the LEFT and RIGHT lung base (image 107). No pneumothorax. No suspicious nodularity. Upper Abdomen: Limited view of the liver, kidneys, pancreas are unremarkable. Normal adrenal glands. Musculoskeletal: No  aggressive osseous lesion. IMPRESSION: 1. Consolidation and airspace disease in the posterior RIGHT upper lobe consistent with pneumonia versus aspiration pneumonitis. 2. Smaller foci of consolidation at the lung bases also concerning for pneumonia versus pneumonitis. 3. No suspicious nodularity. 4. No mediastinal lymphadenopathy Electronically Signed   By: Suzy Bouchard M.D.   On: 04/14/2020 12:57    Assessment:  Thomas Morton is a 87 y.o. 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  Chest CT on 04/14/2020 revealed consolidation and airspace disease in the posterior RUL c/w pneumonia versus aspiration pneumonitis.  There is a smaller foci of consolidation at the lung bases also concerning for pneumonia versus pneumonitis.  Symptomatically, he is feeling better.  He is able to eat.  He notes a sore throat secondary to radiation.  Plan: 1. Acute myelogenous leukemia  Patient diagnosed with AML on 03/14/2020.  He declined bone marrow biopsy and treatment.  He is receiving supportive care only with transfusions and antibiotics.   Prior to admission he was on prophylactic Levaquin, fluconazole, and acyclovir.  All blood products leukopoor and irradiated.     Transfuse PRBCs if Hgb 7-8 and symptomatic.   Transfuse platelets if < 10,000 or bleeding.  Maintain active type and screen.   2. 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  Patient and son request discontinuation of radiation secondary to QOL issues.. 3. Multifocal pneumonia  Possible aspiration event.     Patient evaluated by speech therapy and recommends mechanical soft diet with moistened foods.  Patient on broad spectrum antibiotics (Cefepime, vancomycin, Flagyl).  Fever curve improved. 4. Code status  DNR.  Thank you for allowing me to participate in Mr. Ishmael' care.  I will follow him closely with  you while hospitalized.  Please do not hesitate to contact me if any questions or concerns.   Lequita Asal, MD  04/15/2020, 7:19 PM

## 2020-04-15 NOTE — Evaluation (Signed)
Clinical/Bedside Swallow Evaluation Patient Details  Name: Thomas Morton MRN: [id number] Date of Birth: 1927/07/06  Today's Date: 04/15/2020 Time: SLP Start Time (ACUTE ONLY): 1310 SLP Stop Time (ACUTE ONLY): 1410 SLP Time Calculation (min) (ACUTE ONLY): 60 min  Past Medical History:  Past Medical History:  Diagnosis Date  . Afib ([place])   . Aortic atherosclerosis ([place])   . Arthritis   . B12 deficiency   . Bilateral carotid artery stenosis   . Cancer [facility])    prostate 2001  . Cardiac defibrillator in situ   . CHF (congestive heart failure) ([place])   . Chronic gouty arthritis   . Diabetes mellitus without complication ([place])   . Diabetic sensorimotor neuropathy ([place])   . Emphysema lung ([place])   . GERD (gastroesophageal reflux disease)   . History of cardioembolic cerebrovascular accident (CVA)   . History of DVT (deep vein thrombosis)   . History of kidney stones    H/O  . History of prostatectomy   . Hyperlipidemia   . LBBB (left bundle branch block)   . NICM (nonischemic cardiomyopathy) [facility])    Past Surgical History:  Past Surgical History:  Procedure Laterality Date  . CARDIAC DEFIBRILLATOR PLACEMENT    . DIRECT LARYNGOSCOPY N/A 01/31/2020   Procedure: MICRO DIRECT LARYNGOSCOPYWITH BIOPSY;  Surgeon: Clyde Canterbury, MD;  Location: [facility] ORS;  Service: ENT;  Laterality: N/A;  . eyelid surgery    . KNEE ARTHROSCOPY Bilateral   . PROSTATECTOMY     HPI:  Pt is a 87 year old male with past medical history significant for A. fib, arthritis, B12 deficiency, hyperlipidemia, CHF, cardiac defibrillator in situ, gout, diabetes, emphysema, GERD, history of CVA and DVT who was recently diagnosed with head neck cancer.  He has a several month history of increasing hoarseness who was eventually evaluated by Dr. Richardson Landry who noted vocal cord neoplasm.  Underwent an endoscopy and biopsy which showed a friable lesion of the entire left vocal cord with extension of 8 mm to the subglottic region.  Biopsies positive for squamous cell carcinoma.  He is currently receiving radiation.     He was also found to be pancytopenic.  Hemoglobin 6.5 MCV 115 platelets 48,000 and white blood cell count 1000 with an ANC of 200.  Recently had a bone marrow biopsy which showed 20% blasts concerning for acute leukemia.     He was hospitalized  from 03/11/2020-03/14/2020 for shortness of breath and found to have aspiration pneumonia.  He was initially in ICU for severe sepsis with septic shock and required Levophed.  During his hospital stay his bone marrow biopsy returned showing 20% blasts concerning for Leukemia.  On 04/12/2020, he presents to clinic for blood transfusion. 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 Elected for radiation for head and neck cancer. Receiving supportive care for AML. Labs from 04/11/2020 show a WBC of 1.8, hemoglobin 8.1, platelets 36,000, ANC 0.0.  While getting his vital signs, patient noted to have a temperature of 99.9.  NP asked to come over for evaluation given his severe neutropenia. Patient's son who participates in visit by phone says his father was more fatigued yesterday than baseline but otherwise well. Patient complains of sore throat but otherwise feels at baseline. Denies any neurologic complaints. Denies recent fevers or illnesses. Denies any easy bleeding or bruising. Reports good appetite and denies weight loss. Denies chest pain. Denies any nausea, vomiting, constipation, or diarrhea.  He presented to the ED on 04/26/2020, d/t fever as directed by Oncologist  w/ fever up to 101, worsening fatigue.  He has been on Levaquin and antiviral prophylaxis.  He states he feels fatigued, weak.  His primary complaint is shortness of breath that is productive of yellow-green sputum.  He has had poor appetite.  He said difficulty getting around the house today.  Chest CT: "Consolidation and airspace disease in the posterior right upper lobe consistent with pneumonia versus aspiration pneumonitis;  et al.   Assessment / Plan / Recommendation Clinical Impression  Pt appears to present w/ pharyngeal phase swallow w/ fairly consistent clinical s/s of pharyngeal phase dysphagia noted during trials of Thin liquids via Cup. Pt consumed po trials of Nectar consistency liquids then foods w/ no overt clinical s/s of aspiration during/post po trials. No decline in respiratory presentation during/after trials this session. Pt appears at risk for aspiration moreso w/ thin liquids; risk is reduced when following general aspiration precautions using a Modified Dysphagia diet including Nectar consistency liquids. 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 Pt is also being treated for Leukemia and received a blood transfusion w/ his daily XRT on Friday of this past week; then felt more fatigued than usual. Pt endorses odynophagia intermittently; suspect d/t the discomfort of effects from Radiation tx on pharyngeal-laryngeal mucous membranes. Per Son, pt has begun taking a "Steroid" d/t inflammation/irritation. Pt endorses he has felt a "sore throat" but is not immediately bothered by it during this session today. He has recently been on Peridex, oral rinse. After positioning support and education on general aspiration precautions, pt consumed po trials of thin and Nectar liquids (both via CUP), and puree w/ softened solid food consistencies w/ no immediate, overt coughing, decline in vocal quality from his baseline, or change in respiratory presentation during/post trials. However, mild throat clearing then COUGH occurred w/ trials of thin liquids and ice chips but no overt change in RR/effort following. Oral phase appeared [facility] w/ timely bolus management and control of bolus propulsion for timely A-P transfer for swallowing. Oral clearing achieved w/ all trial consistencies. Speech Clear, low volume. Dentition. Pt fed self w/ setup support.  Pt  appears at risk for aspiration and negative sequelae of aspiration given laryngeal Ca and radiation tx(XRT) w/ recent XRTinduced mucositis w/ irritation/change of oropharyngeal tissue. These issues could possibly impact Timing of the pharyngeal swallow. Thoroughly discussed oral care including warm salt water rinses, recommendations for diet and food consistencies as well as preparation, and NOT using chloraseptic numbing spray BEFORE eating/drinking d/t risk for aspiration. Discussed swallowing and general aspiration concern and impact on Pulmonary status. Also discussed briefly pt's [place], wishes for certain drinks as pt enjoys "Gingerale" not thickened. Encouraged pt/family to further discuss his [place] w/ re: to his food and drink; find foods of choice and alter/match the consistency to what the pt can easily swallowing w/ comfort.  Recommend a more Mech Soft diet w/ moistened foods w/ gravies and Soups added; NECTAR liquids via CUP -- NO STRAWS. Recommend general aspiration precautions, Pills WHOLE in Puree for safer, easier swallowing. Education given; Pt/family to continue to monitor if any increased Dysphagia and/or Throat clearing occurs during current oral intake, monitoring of any negative sequelae from potential aspiration. This may warrant f/u w/ objective swallow assessment(MBSS). Pt/family and NSG agreed. Palliative Care NP updated. Recommend further discussion w/ Palliative Care on pt's [place]; dysphagia. SLP Visit Diagnosis: Dysphagia, pharyngeal phase (R13.13) (laryngeal CA w/ XRT)  Aspiration Risk  Moderate aspiration risk;Risk for inadequate nutrition/hydration    Diet Recommendation  Dysphagia level 3 w/ gravies added to moisten foods - tougher meats Minced for ease; Nectar consistency liquids VIA CUP. Aspiration precautions.  Medication Administration: Whole meds with puree (vs need to Crush in Puree for ease of swallowing)    Other  Recommendations Recommended Consults:  (Palliative  Care consult; Dietician f/u) Oral Care Recommendations: Oral care BID;Oral care before and after PO;Patient independent with oral care Other Recommendations: Order thickener from pharmacy;Prohibited food (jello, ice cream, thin soups);Remove water pitcher;Have oral suction available   Follow up Recommendations  (TBD)      Frequency and Duration min 3x week  2 weeks       Prognosis Prognosis for Safe Diet Advancement: Guarded Barriers to Reach Goals: Time post onset;Severity of deficits (laryngeal CA w/ XRT)      Swallow Study   General Date of Onset: 04/21/2020 HPI: Pt is a 87 year old male with past medical history significant for A. fib, arthritis, B12 deficiency, hyperlipidemia, CHF, cardiac defibrillator in situ, gout, diabetes, emphysema, GERD, history of CVA and DVT who was recently diagnosed with head neck cancer.  He has a several month history of increasing hoarseness who was eventually evaluated by Dr. Richardson Landry who noted vocal cord neoplasm.  Underwent an endoscopy and biopsy which showed a friable lesion of the entire left vocal cord with extension of 8 mm to the subglottic region. Biopsies positive for squamous cell carcinoma.  He is currently receiving radiation.     He was also found to be pancytopenic.  Hemoglobin 6.5 MCV 115 platelets 48,000 and white blood cell count 1000 with an ANC of 200.  Recently had a bone marrow biopsy which showed 20% blasts concerning for acute leukemia.     He was hospitalized  from 03/11/2020-03/14/2020 for shortness of breath and found to have aspiration pneumonia.  He was initially in ICU for severe sepsis with septic shock and required Levophed.  During his hospital stay his bone marrow biopsy returned showing 20% blasts concerning for Leukemia.  On 04/12/2020, he presents to clinic for blood transfusion. 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 Elected for radiation for head and neck cancer. Receiving supportive care for AML. Labs from 04/11/2020 show a  WBC of 1.8, hemoglobin 8.1, platelets 36,000, ANC 0.0.  While getting his vital signs, patient noted to have a temperature of 99.9.  NP asked to come over for evaluation given his severe neutropenia. Patient's son who participates in visit by phone says his father was more fatigued yesterday than baseline but otherwise well. Patient complains of sore throat but otherwise feels at baseline. Denies any neurologic complaints. Denies recent fevers or illnesses. Denies any easy bleeding or bruising. Reports good appetite and denies weight loss. Denies chest pain. Denies any nausea, vomiting, constipation, or diarrhea.  He presented to the ED on 05/07/2020, d/t fever as directed by Oncologist w/ fever up to 101, worsening fatigue.  He has been on Levaquin and antiviral prophylaxis.  He states he feels fatigued, weak.  His primary complaint is shortness of breath that is productive of yellow-green sputum.  He has had poor appetite.  He said difficulty getting around the house today.  Chest CT: "Consolidation and airspace disease in the posterior right upper lobe consistent with pneumonia versus aspiration pneumonitis; et al. Type of Study: Bedside Swallow Evaluation Previous Swallow Assessment: 03/12/2020 Diet Prior to this Study: Dysphagia 3 (soft);Thin liquids (meats chopped, moistened foods)  Temperature Spikes Noted: No (wbc 0.6) Respiratory Status: Nasal cannula (2L down from 3L) History of Recent Intubation: No Behavior/Cognition: Alert;Cooperative;Pleasant mood Oral Cavity Assessment: Within Functional Limits Oral Care Completed by SLP: Recent completion by staff Oral Cavity - Dentition: Adequate natural dentition Vision: Functional for self-feeding Self-Feeding Abilities: Able to feed self;Needs assist;Needs set up Patient Positioning: Upright in bed (needed support for upright, forward positioning) Baseline Vocal Quality: Breathy;Hoarse;Low vocal intensity (gravely; VC paresis/paralysis  baseline) Volitional Cough: Weak ((min); raspy+; nonproductive) Volitional Swallow: Able to elicit    Oral/Motor/Sensory Function Overall Oral Motor/Sensory Function: Within functional limits   Ice Chips Ice chips: Impaired Presentation: Spoon (2 trials) Pharyngeal Phase Impairments: Throat Clearing - Delayed;Cough - Delayed (x1/2 trials)   Thin Liquid Thin Liquid: Impaired Presentation: Cup;Self Fed (3 trials) Pharyngeal  Phase Impairments: Cough - Immediate;Cough - Delayed (x2/3 trials)    Nectar Thick Nectar Thick Liquid: Within functional limits Presentation: Cup;Self Fed (4 trials)   Honey Thick Honey Thick Liquid: Not tested   Puree Puree: Within functional limits Presentation: Self Fed;Spoon (~2-3 ozs)   Solid     Solid: Within functional limits (mech soft) Presentation: Self Fed;Spoon (4 trials)        Orinda Kenner, MS, CCC-SLP Speech Language Pathologist Rehab Services [phone] [facility] 04/15/2020,5:29 PM

## 2020-04-15 NOTE — Progress Notes (Signed)
Inpatient Diabetes Program Recommendations  AACE/ADA: New Consensus Statement on Inpatient Glycemic Control   Target Ranges:  Prepandial:   less than 140 mg/dL      Peak postprandial:   less than 180 mg/dL (1-2 hours)      Critically ill patients:  140 - 180 mg/dL   Results for BAYLIN, GAMBLIN (MRN [id number]) as of 04/15/2020 10:06  Ref. Range 05/08/2020 18:06 04/14/2020 07:36 04/15/2020 05:14  Glucose Latest Ref Range: 70 - 99 mg/dL 236 (H) 240 (H) 415 (H)  Results for HAYK, DIVIS (MRN [id number]) as of 04/15/2020 10:06  Ref. Range 03/12/2020 05:23  Hemoglobin A1C Latest Ref Range: 4.8 - 5.6 % 6.5 (H)   Review of Glycemic Control  Diabetes history: DM2 Outpatient Diabetes medications: None Current orders for Inpatient glycemic control: Solumedrol 40 mg daily  Inpatient Diabetes Program Recommendations:    Insulin: Please consider ordering CBGs with Novolog 0-9 units TID with meals and Novolog 0-5 units QHS.  Thanks, Barnie Alderman, RN, MSN, CDE Diabetes Coordinator Inpatient Diabetes Program [phone] (Team Pager from 8am to 5pm)

## 2020-04-15 NOTE — Consult Note (Addendum)
Pharmacy Antibiotic Note  Thomas Morton is a 87 y.o. male admitted on 04/23/2020 with weakness and fever. Patient with PMH significant for both laryngeal cancer (receiving radiation therapy) and acute leukemia (not an active treatment). Patient with severe neutropenia (ANC 100) and one time fever reading of 100.6. chest imaging c/f concerning for multifocal pneumonia, mildly worsened in the interval.    Pharmacy has been consulted for vancomycin dosing for PNA. febrile neutropenia.   Plan:  Vancomycin 1750 mg IV Loading dose given X 1 in ED  Patient AKI improving Scr 1.70>1.61>1.41  Follow up cultures, fever/ANC curve  Monitor renal function for dose adjustments  Will start Vancomycin 1000 mg IV Q 24 hrs.  Goal AUC 400-550. Expected AUC: 529 SCr used: 1.41  Continue Cefepime to 2g q12hr per CrCL > 60ml/min   Height: 5\' 9"  (175.3 cm) Weight: 78.7 kg (173 lb 8 oz) IBW/kg (Calculated) : 70.7  Temp (24hrs), Avg:98.3 F (36.8 C), Min:97.4 F (36.3 C), Max:98.9 F (37.2 C)  Recent Labs  Lab 04/08/20 1038 04/11/20 1031 04/12/20 1311 05/05/2020 1806 04/24/2020 1956 04/18/2020 2000 04/14/20 0736 04/15/20 0514  WBC 1.4* 1.8* 1.6* 1.8*  --   --  1.4*  --   CREATININE  --   --   --  1.70*  --   --  1.61* 1.41*  LATICACIDVEN  --   --   --   --  1.9 1.2  --   --     Estimated Creatinine Clearance: 34.8 mL/min (A) (by C-G formula based on SCr of 1.41 mg/dL (H)).    Allergies  Allergen Reactions  . Sulfa Antibiotics Rash and Shortness Of Breath    Per patient low doses do not cause issues, only higher doses Pt reports can tolerate low doses of sulfa drugs Pt reports can tolerate low doses of sulfa drugs Per patient low doses do not cause issues, only higher doses   . Levofloxacin Other (See Comments)    Low Blood sugar, Syncope    Antimicrobials this admission: 4/2 cefepime >>  4/2 vancomycin >>  4/2 flagyl >>   Dose adjustments this admission: n/a  Microbiology  results: 4/2 BCx: NGTD 4/2 UCx: cancelled 4/2 Asp Ag, BAL/Serum: sent    Thank you for allowing pharmacy to be a part of this patient's care.  Lu Duffel, PharmD, BCPS Clinical Pharmacist  04/15/2020 9:00 AM

## 2020-04-15 NOTE — Progress Notes (Signed)
   04/15/20 1200  Assess: MEWS Score  Temp (!) 96.7 F (35.9 C)  BP (!) 88/55  Pulse Rate 92  Resp 17  Level of Consciousness Alert  SpO2 100 %  O2 Device Nasal Cannula  O2 Flow Rate (L/min) 2 L/min  Assess: MEWS Score  MEWS Temp 1  MEWS Systolic 1  MEWS Pulse 0  MEWS RR 0  MEWS LOC 0  MEWS Score 2  MEWS Score Color Yellow  Assess: if the MEWS score is Yellow or Red  Were vital signs taken at a resting state? Yes  Focused Assessment No change from prior assessment  Early Detection of Sepsis Score *See Row Information* High  MEWS guidelines implemented *See Row Information* Yes  Treat  MEWS Interventions Escalated (See documentation below)  Take Vital Signs  Increase Vital Sign Frequency  Yellow: Q 2hr X 2 then Q 4hr X 2, if remains yellow, continue Q 4hrs  Escalate  MEWS: Escalate Yellow: discuss with charge nurse/RN and consider discussing with provider and RRT  Notify: Charge Nurse/RN  Name of Charge Nurse/RN Notified Caryl Pina RN   Date Charge Nurse/RN Notified 04/15/20  Time Charge Nurse/RN Notified 1208  Document  Patient Outcome Other (Comment) (no interventions, patiednt eating with low temp)  Progress note created (see row info) Yes

## 2020-04-15 NOTE — Progress Notes (Signed)
   04/15/20 1200  Assess: MEWS Score  Temp (!) 96.7 F (35.9 C) (rechecked and systolic now 86, RN informed and in room)  BP (!) 88/55  Pulse Rate 92  Resp 17  Level of Consciousness Alert  SpO2 100 %  O2 Device Nasal Cannula  O2 Flow Rate (L/min) 2 L/min  Assess: MEWS Score  MEWS Temp 1  MEWS Systolic 1  MEWS Pulse 0  MEWS RR 0  MEWS LOC 0  MEWS Score 2  MEWS Score Color Yellow  Assess: if the MEWS score is Yellow or Red  Were vital signs taken at a resting state? Yes  Focused Assessment No change from prior assessment  Early Detection of Sepsis Score *See Row Information* High  MEWS guidelines implemented *See Row Information* Yes  Treat  MEWS Interventions Escalated (See documentation below)  Take Vital Signs  Increase Vital Sign Frequency  Yellow: Q 2hr X 2 then Q 4hr X 2, if remains yellow, continue Q 4hrs  Escalate  MEWS: Escalate Yellow: discuss with charge nurse/RN and consider discussing with provider and RRT  Notify: Charge Nurse/RN  Name of Charge Nurse/RN Notified Teacher, English as a foreign language   Date Charge Nurse/RN Notified 04/15/20  Time Charge Nurse/RN Notified 1208  Document  Patient Outcome Other (Comment) (no interventions, patiednt eating with low temp)  Progress note created (see row info) Yes  Inserted for Montine Circle RN

## 2020-04-15 NOTE — TOC Initial Note (Signed)
Transition of Care [facility]) - Initial/Assessment Note    Patient Details  Name: Thomas Morton MRN: [id number] Date of Birth: 1927/04/13  Transition [facility]) CM/SW Contact:    Shelbie Hutching, RN Phone Number: 04/15/2020, 1:31 PM  Clinical Narrative:                 Patient has leukemia, admitted to the hospital with sepsis due to pneumonia.  Patient is on acute O2 at 2L.  RNCM met with patient and his sons at the bedside.  Patient is from home where he lives alone and is mostly independent.  Patient has a walker, bedside commode and shower stool at home.  Patient does not drive but his sons provide any needed transportation.  Patient is not medically ready for discharge.  TOC will cont to follow.    Expected Discharge Plan: [place] Barriers to Discharge: Continued Medical Work up   Patient Goals and CMS Choice        Expected Discharge Plan and Services Expected Discharge Plan: [place]   Discharge Planning Services: CM Consult   Living arrangements for the past 2 months: Single Family Home                 DME Arranged: N/A                    Prior Living Arrangements/Services Living arrangements for the past 2 months: Single Family Home Lives with:: Self Patient language and need for interpreter reviewed:: Yes Do you feel safe going back to the place where you live?: Yes      Need for Family Participation in Patient Care: Yes (Comment) (sepsis) Care giver support system in place?: Yes (comment) (sons) Current home services: DME (walker, BSC, shower stool) Criminal Activity/Legal Involvement Pertinent to Current Situation/Hospitalization: No - Comment as needed  Activities of Daily Living Home Assistive Devices/Equipment: Walker (specify type) ADL Screening (condition at time of admission) Patient's cognitive ability adequate to safely complete daily activities?: Yes Is the patient deaf or have difficulty hearing?:  Yes Does the patient have difficulty seeing, even when wearing glasses/contacts?: Yes Does the patient have difficulty concentrating, remembering, or making decisions?: No Patient able to express need for assistance with ADLs?: Yes Does the patient have difficulty dressing or bathing?: Yes Independently performs ADLs?: No Communication: Needs assistance Is this a change from baseline?: Change from baseline, expected to last >3 days Dressing (OT): Needs assistance Is this a change from baseline?: Change from baseline, expected to last >3 days Grooming: Needs assistance Is this a change from baseline?: Change from baseline, expected to last >3 days Feeding: Needs assistance Is this a change from baseline?: Change from baseline, expected to last >3 days Bathing: Needs assistance Is this a change from baseline?: Change from baseline, expected to last >3 days Toileting: Independent with device (comment),Needs assistance Is this a change from baseline?: Change from baseline, expected to last >3days In/Out Bed: Needs assistance Is this a change from baseline?: Change from baseline, expected to last >3 days Walks in Home: Needs assistance Is this a change from baseline?: Change from baseline, expected to last >3 days Does the patient have difficulty walking or climbing stairs?: Yes Weakness of Legs: Both Weakness of Arms/Hands: Both  Permission Sought/Granted Permission sought to share information with : Case Burnett granted to share information with : Yes, Verbal Permission Granted  Share Information with NAME: Aaron Edelman and  Quashaun     Permission granted to share info w Relationship: sons     Emotional Assessment Appearance:: Appears stated age Attitude/Demeanor/Rapport: Engaged Affect (typically observed): Accepting Orientation: : Oriented to Self,Oriented to Place,Oriented to Situation,Oriented to  Time Alcohol / Substance Use: Not Applicable Psych  Involvement: No (comment)  Admission diagnosis:  Sepsis ([place]) [A41.9] Acute respiratory failure with hypoxemia ([place]) [J96.01] Sepsis due to pneumonia ([place]) [J18.9, A41.9] Leukemia not having achieved remission, unspecified leukemia type ([place]) [C95.90] Patient Active Problem List   Diagnosis Date Noted  . Neutropenic fever ([place])   . Severe sepsis ([place]) 05/05/2020  . Goals of care, counseling/discussion 04/12/2020  . Symptomatic anemia 04/01/2020  . Cardiac defibrillator in situ 03/27/2020  . Acute myeloid leukemia not having achieved remission ([place])   . Pressure injury of skin 03/13/2020  . Chronic systolic CHF (congestive heart failure) ([place])   . Aspiration pneumonia ([place]) 03/11/2020  . HCAP (healthcare-associated pneumonia) 03/11/2020  . Severe sepsis with septic shock ([place]) 03/11/2020  . Acute kidney injury superimposed on CKD ([place]) 03/11/2020  . GERD (gastroesophageal reflux disease)   . Chronic gouty arthritis   . History of cardioembolic cerebrovascular accident (CVA)   . Hyperlipidemia   . Type 2 diabetes mellitus with hyperlipidemia ([place])   . Atrial fibrillation, chronic ([place])   . Pancytopenia ([place])   . Elevated troponin   . Laryngeal cancer ([place])   . Mild emphysema ([place]) 03/31/2018  . Acute respiratory failure with hypoxemia ([place]) 12/22/2017  . LBBB (left bundle branch block) 04/29/2016  . B12 deficiency 02/14/2016  . Asymptomatic bilateral carotid artery stenosis 10/24/2015  . Aortic atherosclerosis ([place]) 10/20/2015  . History of CVA (cerebrovascular accident) 10/20/2015  . Dizziness and giddiness 10/15/2015  . CVA (cerebral vascular accident) ([place]) 10/14/2015  . Numbness 10/14/2015  . Chronic renal insufficiency 10/14/2015  . Essential hypertension 10/14/2015  . Diabetic sensorimotor neuropathy ([place]) 02/13/2015  . History of prostate cancer 02/08/2014  . DM (diabetes mellitus) type II controlled, neurological manifestation ([place]) 08/04/2013  . Encounter for  fitting or adjustment of implantable cardioverter-defibrillator (ICD) 03/30/2012  . History of DVT (deep vein thrombosis) 03/29/2012  . H/O prostatectomy 01/12/1994   PCP:  Rusty Aus, MD Pharmacy:   [place], [place] - [address] [place] [address] Phone: [phone] Fax: [phone]     Social Determinants of Health (SDOH) Interventions    Readmission Risk Interventions No flowsheet data found.

## 2020-04-15 NOTE — Evaluation (Signed)
Physical Therapy Evaluation Patient Details Name: Thomas Morton MRN: [id number] DOB: 04-25-1927 Today's Date: 04/15/2020   History of Present Illness  Pt is a 87 y.o. male arrived to ED with SOB and a fever that began on 4/1. Currently receiving radiation for laryngeal cancer and diagnosed with leukemia (not currently receiving treatment). PMH: afib, CHF, DM, emphysema, CVA, LBBB. Admitted for treatment of severe sepsis due to aspiration PNA, neutropenic fever.    Clinical Impression  Patient alert, agreeable to PT, family at bedside. Per pt and family report, for the last several weeks since last admission he has needed more assistance for ADLs, and has had more difficulty with ambulation. No falls. Lives alone but family has been trying to stay with him nearly 24/7 for the last couple of weeks. Pt also needs assistance with meals at this time.  He was able to perform several supine exercises with tactile and verbal cues.The patient demonstrated supine to sit with CGA. Able to sit EOB for several minutes for heel raises and BP assessment. Fair sitting balance noted. Sit <> Stand without AD at pt request, required two person handheld assist to complete safely as well as to take 1-2 steps to the R towards EOB. Further mobility deferred due to pt fatigue and low BP.  Overall the patient demonstrated deficits (see "PT Problem List") that impede the patient's functional abilities, safety, and mobility and would benefit from skilled PT intervention. Recommendation is SNF due to current level of assistance needed and recent decline in functional status to maximize strength, function and independence.   BP assessed throughout session, RN notified of readings. Supine 84/65 HR 96; sitting EOB 80/46 HR 107; and again in sitting 100/54 HR 64. Returned to supine after momentarily standing, BP 90/59.    Follow Up Recommendations SNF    Equipment Recommendations  None recommended by PT;Other (comment) (pt has  RW and BSC at home)    Recommendations for Other Services       Precautions / Restrictions Precautions Precautions: Fall Restrictions Weight Bearing Restrictions: No      Mobility  Bed Mobility Overal bed mobility: Needs Assistance Bed Mobility: Supine to Sit;Sit to Supine     Supine to sit: Min guard;HOB elevated Sit to supine: Min guard;HOB elevated        Transfers Overall transfer level: Needs assistance Equipment used: 2 person hand held assist Transfers: Sit to/from Stand Sit to Stand: Mod assist         General transfer comment: pt unsteady, several attempts needed in order to come up into fully standing. two person handheld assist for safety, would benefit from using RW next attempt  Ambulation/Gait   Gait Distance (Feet): 1 Feet Assistive device: 2 person hand held assist       General Gait Details: 1-2 steps towards HOB, limited due to low BP this session  Stairs            Wheelchair Mobility    Modified Rankin (Stroke Patients Only)       Balance Overall balance assessment: Needs assistance Sitting-balance support: Feet supported Sitting balance-Leahy Scale: Fair       Standing balance-Leahy Scale: Poor Standing balance comment: reliant on UE support                             Pertinent Vitals/Pain Pain Assessment: No/denies pain    Home Living Family/patient expects to be discharged to:: Private residence Living  Arrangements: Alone;Children Available Help at Discharge: Family Type of Home: House Home Access: Stairs to enter Entrance Stairs-Rails: Right Entrance Stairs-Number of Steps: 3 from garage Home Layout: Multi-level;Other (Comment);Able to live on main level with bedroom/bathroom (split level home) Home Equipment: Walker - 2 wheels;Bedside commode Additional Comments: for about 3-4weeks the patient has needed more assistance    Prior Function Level of Independence: Needs assistance   Gait /  Transfers Assistance Needed: ambulating without AD in home  ADL's / Homemaking Assistance Needed: family assists with meals, ADLs, dressing, etc  Comments: denies falls     Hand Dominance        Extremity/Trunk Assessment   Upper Extremity Assessment Upper Extremity Assessment: Generalized weakness    Lower Extremity Assessment Lower Extremity Assessment: Generalized weakness    Cervical / Trunk Assessment Cervical / Trunk Assessment: Normal  Communication   Communication:  (limited phonation due to laryngeal cancer)  Cognition Arousal/Alertness: Awake/alert Behavior During Therapy: WFL for tasks assessed/performed Overall Cognitive Status: Within Functional Limits for tasks assessed                                        General Comments      Exercises General Exercises - Lower Extremity Ankle Circles/Pumps: AROM;Both;15 reps Heel Slides: AROM;Strengthening;Both;15 reps Hip ABduction/ADduction: AROM;Strengthening;Both;15 reps Heel Raises: AROM;Strengthening;Both;20 reps Other Exercises Other Exercises: BP assessed several times: 84/65 HR 96; sitting EOB 80/46 HR 107; and again in sitting 100/54 HR 64. Returned to supine after momentarily standing, BP 90/59. Other Exercises: Pt gown changed, linens changed because of condom cath coming off after returning to supine   Assessment/Plan    PT Assessment Patient needs continued PT services  PT Problem List Decreased strength;Decreased range of motion;Decreased activity tolerance;Decreased balance;Decreased mobility;Cardiopulmonary status limiting activity;Decreased knowledge of use of DME       PT Treatment Interventions DME instruction;Balance training;Gait training;Neuromuscular re-education;Stair training;Functional mobility training;Therapeutic activities;Patient/family education;Therapeutic exercise    PT Goals (Current goals can be found in the Care Plan section)  Acute Rehab PT Goals Patient  Stated Goal: to get his strength back PT Goal Formulation: With patient/family Time For Goal Achievement: 04/29/20 Potential to Achieve Goals: Good    Frequency Min 2X/week   Barriers to discharge        Co-evaluation               AM-PAC PT "6 Clicks" Mobility  Outcome Measure Help needed turning from your back to your side while in a flat bed without using bedrails?: A Little Help needed moving from lying on your back to sitting on the side of a flat bed without using bedrails?: A Little Help needed moving to and from a bed to a chair (including a wheelchair)?: A Little Help needed standing up from a chair using your arms (e.g., wheelchair or bedside chair)?: A Little Help needed to walk in hospital room?: A Lot Help needed climbing 3-5 steps with a railing? : A Lot 6 Click Score: 16    End of Session Equipment Utilized During Treatment: Gait belt Activity Tolerance: Patient tolerated treatment well;Treatment limited secondary to medical complications (Comment);Other (comment) (low BP) Patient left: in bed;with call bell/phone within reach;with bed alarm set Nurse Communication: Mobility status PT Visit Diagnosis: Other abnormalities of gait and mobility (R26.89);Muscle weakness (generalized) (M62.81);Difficulty in walking, not elsewhere classified (R26.2)    Time: 6433-2951 PT Time Calculation (  min) (ACUTE ONLY): 50 min   Charges:     PT Treatments $Therapeutic Exercise: 38-52 mins        Lieutenant Diego PT, DPT 3:21 PM,04/15/20

## 2020-04-15 NOTE — Progress Notes (Signed)
Patient ID: Thomas Morton, male   DOB: 04/23/1927, 87 y.o.   MRN: [id number] [facility] PROGRESS NOTE  JAKEIM SEDORE [id number]:[id number] DOB: 1927-12-13 DOA: 04/28/2020 PCP: Rusty Aus, MD  HPI/Subjective: Patient feeling a little bit better today.  Less gurgling in the upper airway.  Able to eat a little bit better today.  Still with some shortness of breath and cough.  Feeling better.  Objective: Vitals:   04/15/20 0839 04/15/20 1200  BP: (!) 95/58 (!) 88/55  Pulse: 91 92  Resp: 18 17  Temp: (!) 97.4 F (36.3 C) (!) 96.7 F (35.9 C)  SpO2: 99% 100%   No intake or output data in the 24 hours ending 04/15/20 1329 Filed Weights   05/07/2020 1757 04/14/20 0451  Weight: 75.8 kg 78.7 kg    ROS: Review of Systems  Respiratory: Positive for cough and shortness of breath.   Cardiovascular: Negative for chest pain.  Gastrointestinal: Negative for abdominal pain, nausea and vomiting.   Exam: Physical Exam HENT:     Head: Normocephalic.     Mouth/Throat:     Pharynx: No oropharyngeal exudate.  Eyes:     General: Lids are normal.     Conjunctiva/sclera: Conjunctivae normal.     Pupils: Pupils are equal, round, and reactive to light.  Cardiovascular:     Rate and Rhythm: Normal rate and regular rhythm.     Heart sounds: Normal heart sounds, S1 normal and S2 normal.  Pulmonary:     Breath sounds: Transmitted upper airway sounds present. Examination of the right-middle field reveals decreased breath sounds and rhonchi. Examination of the left-middle field reveals decreased breath sounds and rhonchi. Examination of the right-lower field reveals decreased breath sounds and rhonchi. Examination of the left-lower field reveals decreased breath sounds and rhonchi. Decreased breath sounds and rhonchi present. No wheezing or rales.  Abdominal:     Palpations: Abdomen is soft.     Tenderness: There is no abdominal tenderness.  Musculoskeletal:     Right lower leg: No swelling.      Left lower leg: No swelling.  Skin:    General: Skin is warm.     Comments: Bruising on the arms  Neurological:     Mental Status: He is alert and oriented to person, place, and time.     Comments: Able to straight leg raise.       Data Reviewed: Basic Metabolic Panel: Recent Labs  Lab 04/14/2020 1806 04/14/20 0736 04/15/20 0514  NA 136 137 138  K 4.3 4.1 4.3  CL 100 102 106  CO2 27 26 26   GLUCOSE 236* 240* 415*  BUN 34* 31* 44*  CREATININE 1.70* 1.61* 1.41*  CALCIUM 8.8* 8.1* 8.1*   Liver Function Tests: Recent Labs  Lab 04/22/2020 1806 04/14/20 0736  AST 20 16  ALT 12 12  ALKPHOS 69 55  BILITOT 0.8 0.7  PROT 6.9 6.1*  ALBUMIN 3.2* 2.7*   CBC: Recent Labs  Lab 04/11/20 1031 04/12/20 1311 05/10/2020 1806 04/14/20 0736 04/15/20 0515  WBC 1.8* 1.6* 1.8* 1.4* 0.6*  NEUTROABS 0.0* 0.1* 0.1*  --   --   HGB 8.1* 7.4* 8.8* 7.8* 7.9*  HCT 23.8* 22.4* 26.2* 23.3* 23.4*  MCV 100.0 100.4* 98.1 99.1 99.6  PLT 36* 28* 31* 23* 20*  BNP (last 3 results) Recent Labs    03/11/20 1024 04/20/2020 1806  BNP 119.2* 63.1    Recent Results (from the past 240 hour(s))  Culture, blood (Routine  X 2) w Reflex to ID Panel     Status: None (Preliminary result)   Collection Time: 04/12/20  1:10 PM   Specimen: BLOOD  Result Value Ref Range Status   Specimen Description   Final    BLOOD RIGHT ARM Performed at [facility], [address], [place], [place] [address]    Special Requests   Final    BOTTLES DRAWN AEROBIC AND ANAEROBIC Blood Culture adequate volume Performed at [facility], [address], [place], [place] [address]    Culture   Final    NO GROWTH 3 DAYS Performed at [facility], [address] [place], [place] [address]    Report Status PENDING  Incomplete  Culture, blood (Routine X 2) w Reflex to ID Panel     Status: None (Preliminary result)   Collection Time: 04/12/20  1:15 PM   Specimen: BLOOD  Result Value Ref Range Status    Specimen Description   Final    BLOOD LEFT ARM Performed at [facility], [address], [place], [place] [address]    Special Requests   Final    BOTTLES DRAWN AEROBIC AND ANAEROBIC Blood Culture adequate volume Performed at [facility], [address] [place], [place] [address]    Culture   Final    NO GROWTH 3 DAYS Performed at [facility], [address] [place], [place] [address]    Report Status PENDING  Incomplete  Urine Culture     Status: None   Collection Time: 04/12/20  3:01 PM   Specimen: Urine, Random  Result Value Ref Range Status   Specimen Description   Final    URINE, RANDOM Performed at [facility], [address] [place], [place] [address]    Special Requests   Final    NONE Performed at  [facility], [address] [place], [place] [address]    Culture   Final    NO GROWTH Performed at [facility], [place] [address], [place], [place] [address]    Report Status 04/14/2020 FINAL  Final  Resp Panel by RT-PCR (Flu A&B, Covid) Nasopharyngeal Swab     Status: None   Collection Time: 04/14/2020  5:56 PM   Specimen: Nasopharyngeal Swab; Nasopharyngeal(NP) swabs in vial transport medium  Result Value Ref Range Status   SARS Coronavirus 2 by RT PCR NEGATIVE NEGATIVE Final    Comment: (NOTE) SARS-CoV-2 target nucleic acids are NOT DETECTED.  The SARS-CoV-2 RNA is generally detectable in upper respiratory specimens during the acute phase of infection. The lowest concentration of SARS-CoV-2 viral copies this assay can detect is 138 copies/mL. A negative result does not preclude SARS-Cov-2 infection and should not be used as the sole basis for treatment or other patient management decisions. A negative result may occur with  improper specimen collection/handling, submission of specimen other than nasopharyngeal swab, presence of viral mutation(s) within the areas targeted by this assay, and inadequate number of  viral copies(<138 copies/mL). A negative result must be combined with clinical observations, patient history, and epidemiological information. The expected result is Negative.  Fact Sheet for Patients:  EntrepreneurPulse.com.au  Fact Sheet for Healthcare Providers:  IncredibleEmployment.be  This test is no t yet approved or cleared by the [place] FDA and  has been authorized for detection and/or diagnosis of SARS-CoV-2 by FDA under an Emergency Use Authorization (EUA). This EUA will remain  in effect (meaning this test can be used) for  the duration of the COVID-19 declaration under Section 564(b)(1) of the Act, 21 U.S.C.section 360bbb-3(b)(1), unless the authorization is terminated  or revoked sooner.       Influenza A by PCR NEGATIVE NEGATIVE Final   Influenza B by PCR NEGATIVE NEGATIVE Final    Comment: (NOTE) The Xpert Xpress SARS-CoV-2/FLU/RSV plus assay is intended as an aid in the diagnosis of influenza from Nasopharyngeal swab specimens and should not be used as a sole basis for treatment. Nasal washings and aspirates are unacceptable for Xpert Xpress SARS-CoV-2/FLU/RSV testing.  Fact Sheet for Patients: EntrepreneurPulse.com.au  Fact Sheet for Healthcare Providers: IncredibleEmployment.be  This test is not yet approved or cleared by the [place] FDA and has been authorized for detection and/or diagnosis of SARS-CoV-2 by FDA under an Emergency Use Authorization (EUA). This EUA will remain in effect (meaning this test can be used) for the duration of the COVID-19 declaration under Section 564(b)(1) of the Act, 21 U.S.C. section 360bbb-3(b)(1), unless the authorization is terminated or revoked.  Performed at [facility], [address] [place], [place] [address]   Blood Culture (routine x 2)     Status: None (Preliminary result)   Collection Time: 05/07/2020  6:06 PM    Specimen: BLOOD  Result Value Ref Range Status   Specimen Description BLOOD  RIGHT [facility]  Final   Special Requests   Final    BOTTLES DRAWN AEROBIC AND ANAEROBIC Blood Culture adequate volume   Culture   Final    NO GROWTH 2 DAYS Performed at [facility], [address] [place], [place] [address]    Report Status PENDING  Incomplete  Blood Culture (routine x 2)     Status: None (Preliminary result)   Collection Time: 05/02/2020  6:07 PM   Specimen: BLOOD  Result Value Ref Range Status   Specimen Description BLOOD  RIGHT HAND  Final   Special Requests   Final    BOTTLES DRAWN AEROBIC AND ANAEROBIC Blood Culture results may not be optimal due to an inadequate volume of blood received in culture bottles   Culture   Final    NO GROWTH 2 DAYS Performed at [facility], [address], [place], [place] [address]    Report Status PENDING  Incomplete     Studies: CT CHEST WO CONTRAST  Result Date: 04/14/2020 CLINICAL DATA:  Respiratory failure EXAM: CT CHEST WITHOUT CONTRAST TECHNIQUE: Multidetector CT imaging of the chest was performed following the standard protocol without IV contrast. COMPARISON:  Radiograph 04/24/2020, CT 03/30/2018 FINDINGS: Cardiovascular: LEFT-sided pacemaker with RIGHT heart leads. No pericardial effusion. Mediastinum/Nodes: No axillary or supraclavicular adenopathy. No mediastinal or hilar adenopathy. No pericardial fluid. Esophagus normal. Lungs/Pleura: Airspace opacity and with focal consolidation within the posterior aspect of the RIGHT upper lobe (images 37 through 56 series 3). Small foci of consolidation at the LEFT and RIGHT lung base (image 107). No pneumothorax. No suspicious nodularity. Upper Abdomen: Limited view of the liver, kidneys, pancreas are unremarkable. Normal adrenal glands. Musculoskeletal: No aggressive osseous lesion. IMPRESSION: 1. Consolidation and airspace disease in the posterior RIGHT upper lobe consistent with pneumonia  versus aspiration pneumonitis. 2. Smaller foci of consolidation at the lung bases also concerning for pneumonia versus pneumonitis. 3. No suspicious nodularity. 4. No mediastinal lymphadenopathy Electronically Signed   By: Suzy Bouchard M.D.   On: 04/14/2020 12:57   DG Chest Port 1 View  Result Date: 05/09/2020 CLINICAL DATA:  Questionable sepsis.  Shortness of breath and fever.  EXAM: PORTABLE CHEST 1 VIEW COMPARISON:  March 11, 2020 FINDINGS: Continued infiltrate in the right upper lobe. Mild increased opacity in the right base. Small left effusion. Mild opacity in the left base seen as well, unchanged. The cardiomediastinal silhouette is stable. No pneumothorax. No other acute abnormalities. IMPRESSION: Persistent infiltrate in the right upper lobe. Increasing opacity in the right lower lobe. Stable mild opacity in left base may represent atelectasis. The findings are still concerning for multifocal pneumonia, mildly worsened in the interval. However, the opacity in the right upper lobe has been present for greater than a month. CT imaging could further evaluate as clinically warranted. Otherwise, recommend treatment with a short-term follow-up chest x-ray to ensure resolution. Electronically Signed   By: Dorise Bullion III M.D   On: 04/22/2020 18:30    Scheduled Meds: . feeding supplement  237 mL Oral BID BM  . insulin aspart  0-5 Units Subcutaneous QHS  . insulin aspart  0-9 Units Subcutaneous TID WC  . ipratropium-albuterol  3 mL Nebulization Q6H  . methylPREDNISolone (SOLU-MEDROL) injection  40 mg Intravenous Daily  . valACYclovir  500 mg Oral Daily   Continuous Infusions: . ceFEPime (MAXIPIME) IV 2 g (04/15/20 1245)  . lactated ringers 50 mL/hr at 04/14/20 1313  . metronidazole 500 mg (04/15/20 0842)  . vancomycin     History.  Patient admitted 04/15/2020 with weakness and found to have severe sepsis with aspiration pneumonia and neutropenic fever.  Started on aggressive antibiotics.   Patient does have a history of laryngeal cancer receiving radiation treatments and chemo treatments for that.  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  Admitted with pancytopenia.  Assessment/Plan: 1. Severe sepsis, present on admission.  The patient has neutropenic fever and aspiration pneumonia.  Also acute kidney injury and acute hypoxic respiratory failure.  Patient had fever, neutropenia tachycardia and tachypnea on presentation.  Patient given aggressive antibiotics with vancomycin Maxipime and Flagyl.  So far cultures are negative.  Continue IV fluid hydration.  Continue Solu-Medrol.  Continue nebulizer treatments.  See if we can taper off the oxygen. 2. Laryngeal cancer with upper airway congestion.  Patient better today after starting Solu-Medrol yesterday.  As needed glycopyrrolate.  I did put the patient on a very restrictive diet pure with thickened liquids.  Hopefully speech therapy can advance his diet. 3. AML.  Pancytopenia.  Patient not undergoing any treatment for this.  Platelet count stable but low at 20.  White count still low at 0.6.  Hemoglobin low at 7.9. 4. Acute kidney injury on chronic kidney disease stage IIIa.  Continue IV fluid hydration.  Hold Coreg with relative hypotension.  Creatinine 1.7 on presentation now down to 1.41.  Baseline creatinine around 1.32. 5. Chronic atrial fibrillation.  Not on any anticoagulation for bleeding risk is high with low platelets.  Hold Coreg with blood pressure being on the lower side. 6. Chronic systolic congestive heart failure.  Gentle IV fluids.  No signs of heart failure currently.  Holding Coreg and Lasix. 7. Type 2 diabetes mellitus with hyperlipidemia.  Hold cholesterol medication.  Last hemoglobin A1c 6.5.  Sliding scale for now. 8. Stage II coccyx decubiti, present on admission.  See description below 9. History of nonhemorrhagic stroke.  Not giving any antiplatelet agents with platelets being 20.   High risk of bleeding. 10. Weakness.  Physical therapy evaluate  Pressure Injury 03/12/20 Coccyx Mid Stage 2 -  Partial thickness loss of dermis presenting  as a shallow open injury with a red, pink wound bed without slough. white (Active)  03/12/20 1844  Location: Coccyx  Location Orientation: Mid  Staging: Stage 2 -  Partial thickness loss of dermis presenting as a shallow open injury with a red, pink wound bed without slough.  Wound Description (Comments): white  Present on Admission: Yes       Code Status:     Code Status Orders  (From admission, onward)         Start     Ordered   05/11/2020 2159  Do not attempt resuscitation (DNR)  Continuous       Question Answer Comment  In the event of cardiac or respiratory ARREST Do not call a "code blue"   In the event of cardiac or respiratory ARREST Do not perform Intubation, CPR, defibrillation or ACLS   In the event of cardiac or respiratory ARREST Use medication by any route, position, wound care, and other measures to relive pain and suffering. May use oxygen, suction and manual treatment of airway obstruction as needed for comfort.      04/17/2020 2159        Code Status History    Date Active Date Inactive Code Status Order ID Comments User Context   03/12/2020 0718 03/15/2020 0504 DNR [id number]  Flora Lipps, MD Inpatient   03/11/2020 2206 03/12/2020 0718 Partial Code [id number] Vasopressor administration only Rust-Chester, Huel Cote, NP Inpatient   03/11/2020 1926 03/11/2020 2206 Full Code [id number]  Ivor Costa, MD ED   03/11/2020 1301 03/11/2020 1926 Partial Code [id number] No CPR or intubation, but vassopressor OK per his son Ivor Costa, MD ED   03/11/2020 1301 03/11/2020 1301 Partial Code [id number]  Ivor Costa, MD ED   12/22/2017 1620 12/25/2017 2246 Full Code [id number]  Loletha Grayer, MD Inpatient   10/14/2015 1055 10/15/2015 1625 Full Code [id number]  Theodoro Grist, MD Inpatient   Advance Care Planning Activity    Advance Directive  Documentation   Flowsheet Row Most Recent Value  Type of Advance Directive Healthcare Power of Attorney  Pre-existing out of facility DNR order (yellow form or pink MOST form) --  "MOST" Form in Place? --     Family Communication: Family at the bedside Disposition Plan: Status is: Inpatient  Dispo: The patient is from: Home              Anticipated d/c is to: Home              Patient currently being treated for neutropenic fever and severe sepsis with IV antibiotics.   Difficult to place patient.  Hopefully not.  Time spent: 28 minutes  [place]

## 2020-04-15 NOTE — Progress Notes (Addendum)
Initial Nutrition Assessment  DOCUMENTATION CODES:   Not applicable  INTERVENTION:   -MVI with minerals daily -D/c Ensure Enlive po BID, each supplement provides 350 kcal and 20 grams of protein -Magic cup TID with meals, each supplement provides 290 kcal and 9 grams of protein -MVI with minerals daily -Hormel Shake TID with meals, each supplement provides 520 kcals and 22 grams protein   NUTRITION DIAGNOSIS:   Inadequate oral intake related to dysphagia as evidenced by meal completion < 25%.  GOAL:   Patient will meet greater than or equal to 90% of their needs  MONITOR:   PO intake,Supplement acceptance,Diet advancement,Labs,Weight trends,Skin,I & O's  REASON FOR ASSESSMENT:   Malnutrition Screening Tool    ASSESSMENT:   Thomas Morton is a 87 y.o. male with history of A. fib, CVA, hypertension, systolic CHF, left bundle branch block, emphysema, recent diagnosis of both laryngeal cancer (receiving radiation therapy) and acute leukemia (not an active treatment), recent admission at the end of February 2022 for septic shock, who presents with weakness.  Pt admitted with severe sepsis and likely aspiration pneumonia.   4/4- s/p BSE- advanced to dysphagia 3 diet with nectar thick liquids  Reviewed I/O's: +279 ml x 24 hours and +1 L since admission  Attempted to speak with pt x 3, however, unavailable at times of visits. Unable to obtain further nutrition-related history or complete nutrition-focused physical exam.   Reviewed meal completion records; noted meal completions 0%.   Reviewed wt hx; pt has experienced a 4.8% wt loss over the past month.   Medications reviewed and include solu-medrol.   Labs reviewed.   Diet Order:   Diet Order            DIET DYS 3 Room service appropriate? Yes with Assist; Fluid consistency: Nectar Thick  Diet effective now                 EDUCATION NEEDS:   No education needs have been identified at this time  Skin:  Skin  Assessment: Skin Integrity Issues: Skin Integrity Issues:: Stage II Stage II: coccyx  Last BM:  04/27/2020  Height:   Ht Readings from Last 1 Encounters:  05/05/2020 5\' 9"  (1.753 m)    Weight:   Wt Readings from Last 1 Encounters:  04/14/20 78.7 kg    Ideal Body Weight:  72.7 kg  BMI:  Body mass index is 25.62 kg/m.  Estimated Nutritional Needs:   Kcal:  2000-2200  Protein:  95-110 grams  Fluid:  > 2 L    Loistine Chance, RD, LDN, [place] Registered Dietitian II Certified Diabetes Care and Education Specialist Please refer to [facility] for RD and/or RD on-call/weekend/after hours pager

## 2020-04-16 ENCOUNTER — Inpatient Hospital Stay: Payer: Medicare HMO

## 2020-04-16 ENCOUNTER — Ambulatory Visit: Payer: Medicare HMO

## 2020-04-16 DIAGNOSIS — J690 Pneumonitis due to inhalation of food and vomit: Secondary | ICD-10-CM | POA: Diagnosis not present

## 2020-04-16 DIAGNOSIS — A419 Sepsis, unspecified organism: Secondary | ICD-10-CM | POA: Diagnosis not present

## 2020-04-16 DIAGNOSIS — D709 Neutropenia, unspecified: Secondary | ICD-10-CM | POA: Diagnosis not present

## 2020-04-16 DIAGNOSIS — C329 Malignant neoplasm of larynx, unspecified: Secondary | ICD-10-CM | POA: Diagnosis not present

## 2020-04-16 LAB — CBC WITH DIFFERENTIAL/PLATELET
Abs Immature Granulocytes: 0.1 10*3/uL — ABNORMAL HIGH (ref 0.00–0.07)
Basophils Absolute: 0 10*3/uL (ref 0.0–0.1)
Basophils Relative: 0 %
Eosinophils Absolute: 0 10*3/uL (ref 0.0–0.5)
Eosinophils Relative: 0 %
HCT: 26.5 % — ABNORMAL LOW (ref 39.0–52.0)
Hemoglobin: 8.9 g/dL — ABNORMAL LOW (ref 13.0–17.0)
Immature Granulocytes: 18 %
Lymphocytes Relative: 43 %
Lymphs Abs: 0.3 10*3/uL — ABNORMAL LOW (ref 0.7–4.0)
MCH: 33.5 pg (ref 26.0–34.0)
MCHC: 33.6 g/dL (ref 30.0–36.0)
MCV: 99.6 fL (ref 80.0–100.0)
Monocytes Absolute: 0.2 10*3/uL (ref 0.1–1.0)
Monocytes Relative: 30 %
Neutro Abs: 0.1 10*3/uL — CL (ref 1.7–7.7)
Neutrophils Relative %: 9 %
Platelets: 16 10*3/uL — CL (ref 150–400)
RBC: 2.66 MIL/uL — ABNORMAL LOW (ref 4.22–5.81)
RDW: 19.7 % — ABNORMAL HIGH (ref 11.5–15.5)
Smear Review: DECREASED
WBC: 0.6 10*3/uL — CL (ref 4.0–10.5)
nRBC: 0 % (ref 0.0–0.2)

## 2020-04-16 LAB — BASIC METABOLIC PANEL
Anion gap: 11 (ref 5–15)
BUN: 69 mg/dL — ABNORMAL HIGH (ref 8–23)
CO2: 24 mmol/L (ref 22–32)
Calcium: 8.4 mg/dL — ABNORMAL LOW (ref 8.9–10.3)
Chloride: 104 mmol/L (ref 98–111)
Creatinine, Ser: 1.95 mg/dL — ABNORMAL HIGH (ref 0.61–1.24)
GFR, Estimated: 32 mL/min — ABNORMAL LOW (ref >60)
Glucose, Bld: 493 mg/dL — ABNORMAL HIGH (ref 70–99)
Potassium: 3.9 mmol/L (ref 3.5–5.1)
Sodium: 139 mmol/L (ref 135–145)

## 2020-04-16 LAB — GLUCOSE, CAPILLARY
Glucose-Capillary: 396 mg/dL — ABNORMAL HIGH (ref 70–99)
Glucose-Capillary: 413 mg/dL — ABNORMAL HIGH (ref 70–99)
Glucose-Capillary: 416 mg/dL — ABNORMAL HIGH (ref 70–99)
Glucose-Capillary: 425 mg/dL — ABNORMAL HIGH (ref 70–99)
Glucose-Capillary: 472 mg/dL — ABNORMAL HIGH (ref 70–99)
Glucose-Capillary: 474 mg/dL — ABNORMAL HIGH (ref 70–99)
Glucose-Capillary: 489 mg/dL — ABNORMAL HIGH (ref 70–99)
Glucose-Capillary: 505 mg/dL (ref 70–99)

## 2020-04-16 LAB — TYPE AND SCREEN
ABO/RH(D): B POS
Antibody Screen: NEGATIVE

## 2020-04-16 MED ORDER — INSULIN ASPART 100 UNIT/ML ~~LOC~~ SOLN
0.0000 [IU] | Freq: Three times a day (TID) | SUBCUTANEOUS | Status: DC
  Administered 2020-04-16 (×2): 15 [IU] via SUBCUTANEOUS
  Administered 2020-04-17: 10:00:00 11 [IU] via SUBCUTANEOUS
  Administered 2020-04-17 (×2): 15 [IU] via SUBCUTANEOUS
  Filled 2020-04-16 (×4): qty 1

## 2020-04-16 MED ORDER — INSULIN ASPART 100 UNIT/ML ~~LOC~~ SOLN
3.0000 [IU] | Freq: Once | SUBCUTANEOUS | Status: AC
  Administered 2020-04-16: 02:00:00 3 [IU] via INTRAVENOUS
  Filled 2020-04-16 (×2): qty 0.03

## 2020-04-16 MED ORDER — INSULIN ASPART 100 UNIT/ML ~~LOC~~ SOLN
3.0000 [IU] | Freq: Once | SUBCUTANEOUS | Status: AC
  Administered 2020-04-16: 3 [IU] via INTRAVENOUS
  Filled 2020-04-16 (×4): qty 0.03

## 2020-04-16 MED ORDER — INSULIN REGULAR HUMAN 100 UNIT/ML IJ SOLN
3.0000 [IU] | Freq: Once | INTRAMUSCULAR | Status: DC
  Filled 2020-04-16: qty 3

## 2020-04-16 MED ORDER — INSULIN ASPART 100 UNIT/ML ~~LOC~~ SOLN
0.0000 [IU] | Freq: Every day | SUBCUTANEOUS | Status: DC
  Administered 2020-04-17: 22:00:00 2 [IU] via SUBCUTANEOUS
  Filled 2020-04-16: qty 1

## 2020-04-16 MED ORDER — INSULIN GLARGINE 100 UNIT/ML ~~LOC~~ SOLN
10.0000 [IU] | Freq: Once | SUBCUTANEOUS | Status: AC
  Administered 2020-04-16: 20:00:00 10 [IU] via SUBCUTANEOUS
  Filled 2020-04-16: qty 0.1

## 2020-04-16 MED ORDER — IPRATROPIUM-ALBUTEROL 0.5-2.5 (3) MG/3ML IN SOLN
3.0000 mL | Freq: Three times a day (TID) | RESPIRATORY_TRACT | Status: DC
  Administered 2020-04-16 – 2020-04-18 (×7): 3 mL via RESPIRATORY_TRACT
  Filled 2020-04-16 (×8): qty 3

## 2020-04-16 MED ORDER — METHYLPREDNISOLONE SODIUM SUCC 40 MG IJ SOLR
20.0000 mg | Freq: Every day | INTRAMUSCULAR | Status: DC
  Administered 2020-04-17: 10:00:00 20 mg via INTRAVENOUS
  Filled 2020-04-16: qty 1

## 2020-04-16 MED ORDER — INSULIN REGULAR HUMAN 100 UNIT/ML IJ SOLN: 3.0000 [IU] | Freq: Once | INTRAMUSCULAR | Status: DC

## 2020-04-16 MED ORDER — INSULIN GLARGINE 100 UNIT/ML ~~LOC~~ SOLN
25.0000 [IU] | Freq: Every day | SUBCUTANEOUS | Status: DC
  Administered 2020-04-17: 25 [IU] via SUBCUTANEOUS
  Filled 2020-04-16: qty 0.25

## 2020-04-16 MED ORDER — SODIUM CHLORIDE 0.9 % IV SOLN
2.0000 g | INTRAVENOUS | Status: DC
  Administered 2020-04-16: 13:00:00 2 g via INTRAVENOUS
  Filled 2020-04-16 (×2): qty 2

## 2020-04-16 MED ORDER — INSULIN ASPART 100 UNIT/ML ~~LOC~~ SOLN
8.0000 [IU] | Freq: Three times a day (TID) | SUBCUTANEOUS | Status: DC
  Administered 2020-04-16 – 2020-04-17 (×4): 8 [IU] via SUBCUTANEOUS
  Filled 2020-04-16 (×4): qty 1

## 2020-04-16 MED ORDER — INSULIN ASPART 100 UNIT/ML ~~LOC~~ SOLN
4.0000 [IU] | Freq: Three times a day (TID) | SUBCUTANEOUS | Status: DC
  Administered 2020-04-16: 4 [IU] via SUBCUTANEOUS

## 2020-04-16 NOTE — Progress Notes (Signed)
Physical Therapy Treatment Patient Details Name: Thomas Morton MRN: [id number] DOB: 06/23/1927 Today's Date: 04/16/2020    History of Present Illness Pt is a 87 y.o. male arrived to ED with SOB and a fever that began on 4/1. Currently receiving radiation for laryngeal cancer and diagnosed with leukemia (not currently receiving treatment). PMH: afib, CHF, DM, emphysema, CVA, LBBB. Admitted for treatment of severe sepsis due to aspiration PNA, neutropenic fever.    PT Comments    Pt was long sitting in bed upon arriving. He was agreeable to session and cooperative and pleasant throughout. BP stable throughout session (noted below). No symptoms of orthostatic hypotension or dizziness throughout. He was easily able to stand ~ 5 x. Unable to mobilize away from ped due to frequent episodes of urination. At first, external cath not able to suction enough to clear tube however once yonker disconnected external cath worked properly.Pt does fatigue quickly however vitals remained stable. Discussed with pt/pt's son rehab recommendation. Both state understanding. Acute PT feels pt will greatly benefit from SNF at [place] to address deficits while assisting pt to PLOF.    Follow Up Recommendations  SNF     Equipment Recommendations  None recommended by PT;Other (comment)       Precautions / Restrictions Precautions Precautions: Fall Restrictions Weight Bearing Restrictions: No    Mobility  Bed Mobility Overal bed mobility: Needs Assistance Bed Mobility: Supine to Sit;Sit to Supine     Supine to sit: Min guard;HOB elevated Sit to supine: Min guard;HOB elevated   General bed mobility comments: BP in supine 127/72 in sitting 98/68. After 2 minutes EOB 117/69    Transfers Overall transfer level: Needs assistance Equipment used: Rolling walker (2 wheeled) Transfers: Sit to/from Stand Sit to Stand: Min guard;Supervision         General transfer comment: pt performed STS ~ 4 x throughout  session. Was unable to progress away from EOB due to urination episodes. Had difficulty with suction due to having yonker and external cath both running.  Ambulation/Gait      General Gait Details: Performed marching in place       Balance Overall balance assessment: Needs assistance Sitting-balance support: Feet supported Sitting balance-Leahy Scale: Fair     Standing balance support: Bilateral upper extremity supported;During functional activity Standing balance-Leahy Scale: Fair Standing balance comment: reliant on UE support        Cognition Arousal/Alertness: Awake/alert Behavior During Therapy: WFL for tasks assessed/performed Overall Cognitive Status: Within Functional Limits for tasks assessed          General Comments: Pt is A and O x 4. Agreeable to session and pleasant throughout             Pertinent Vitals/Pain Pain Assessment: No/denies pain           PT Goals (current goals can now be found in the care plan section) Acute Rehab PT Goals Patient Stated Goal: to get his strength back Progress towards PT goals: Progressing toward goals    Frequency    Min 2X/week      PT Plan Current plan remains appropriate       AM-PAC PT "6 Clicks" Mobility   Outcome Measure  Help needed turning from your back to your side while in a flat bed without using bedrails?: A Little Help needed moving from lying on your back to sitting on the side of a flat bed without using bedrails?: A Little Help needed moving to and from a  bed to a chair (including a wheelchair)?: A Little Help needed standing up from a chair using your arms (e.g., wheelchair or bedside chair)?: A Little Help needed to walk in hospital room?: A Little Help needed climbing 3-5 steps with a railing? : A Little 6 Click Score: 18    End of Session Equipment Utilized During Treatment: Gait belt Activity Tolerance: Patient tolerated treatment well;Treatment limited secondary to medical  complications (Comment) (limited by frequent urinations upon standing) Patient left: in bed;with call bell/phone within reach;with bed alarm set;with family/visitor present (son at bedside) Nurse Communication: Mobility status PT Visit Diagnosis: Other abnormalities of gait and mobility (R26.89);Muscle weakness (generalized) (M62.81);Difficulty in walking, not elsewhere classified (R26.2)     Time: 8325-4982 PT Time Calculation (min) (ACUTE ONLY): 27 min  Charges:  $Therapeutic Activity: 23-37 mins                     Julaine Fusi PTA 04/16/20, 4:15 PM

## 2020-04-16 NOTE — Progress Notes (Signed)
Patient ID: Thomas Morton, male   DOB: 1927/04/25, 87 y.o.   MRN: [id number] [facility] PROGRESS NOTE  Thomas Morton YTK:[id number] DOB: [birth year] DOA: 05/02/2020 PCP: Rusty Aus, MD  HPI/Subjective: As per son patient little confused at times.  States he is doing a little bit better with eating.  Still has some upper airway congestion.  Some cough.  Patient admitted with severe sepsis and neutropenic fever.  Objective: Vitals:   04/16/20 0750 04/16/20 1154  BP: 116/81 124/67  Pulse: 97 87  Resp: 18 16  Temp: 97.8 F (36.6 C) 98.5 F (36.9 C)  SpO2: 93% 96%    Intake/Output Summary (Last 24 hours) at 04/16/2020 1454 Last data filed at 04/16/2020 0552 Gross per 24 hour  Intake 1847.17 ml  Output 400 ml  Net 1447.17 ml   Filed Weights   05/01/2020 1757 04/14/20 0451  Weight: 75.8 kg 78.7 kg    ROS: Review of Systems  Respiratory: Positive for cough and shortness of breath.   Cardiovascular: Negative for chest pain.  Gastrointestinal: Negative for abdominal pain, nausea and vomiting.   Exam: Physical Exam HENT:     Head: Normocephalic.     Mouth/Throat:     Pharynx: No oropharyngeal exudate.  Eyes:     General: Lids are normal.     Conjunctiva/sclera: Conjunctivae normal.  Cardiovascular:     Rate and Rhythm: Normal rate and regular rhythm.     Heart sounds: Normal heart sounds, S1 normal and S2 normal.  Pulmonary:     Breath sounds: Examination of the right-lower field reveals decreased breath sounds and wheezing. Examination of the left-lower field reveals decreased breath sounds and wheezing. Decreased breath sounds and wheezing present. No rhonchi or rales.  Abdominal:     Palpations: Abdomen is soft.     Tenderness: There is no abdominal tenderness.  Musculoskeletal:     Right ankle: Swelling present.     Left ankle: Swelling present.  Skin:    General: Skin is warm.     Comments: Bruising on arms and leg  Neurological:     Mental Status: He is  alert.     Comments: Answer some questions but also has some periods of confusion where I am unable to understand him. Able to straight leg raise bilaterally.        Data Reviewed: Basic Metabolic Panel: Recent Labs  Lab 04/15/2020 1806 04/14/20 0736 04/15/20 0514 04/16/20 0051  NA 136 137 138 139  K 4.3 4.1 4.3 3.9  CL 100 102 106 104  CO2 27 26 26 24   GLUCOSE 236* 240* 415* 493*  BUN 34* 31* 44* 69*  CREATININE 1.70* 1.61* 1.41* 1.95*  CALCIUM 8.8* 8.1* 8.1* 8.4*   Liver Function Tests: Recent Labs  Lab 04/18/2020 1806 04/14/20 0736  AST 20 16  ALT 12 12  ALKPHOS 69 55  BILITOT 0.8 0.7  PROT 6.9 6.1*  ALBUMIN 3.2* 2.7*   CBC: Recent Labs  Lab 04/11/20 1031 04/12/20 1311 05/02/2020 1806 04/14/20 0736 04/15/20 0515 04/16/20 0413  WBC 1.8* 1.6* 1.8* 1.4* 0.6* 0.6*  NEUTROABS 0.0* 0.1* 0.1*  --   --  0.1*  HGB 8.1* 7.4* 8.8* 7.8* 7.9* 8.9*  HCT 23.8* 22.4* 26.2* 23.3* 23.4* 26.5*  MCV 100.0 100.4* 98.1 99.1 99.6 99.6  PLT 36* 28* 31* 23* 20* 16*   BNP (last 3 results) Recent Labs    03/11/20 1024 05/08/2020 1806  BNP 119.2* 63.1  CBG: Recent Labs  Lab 04/16/20 0302 04/16/20 0403 04/16/20 0753 04/16/20 1155 04/16/20 1259  GLUCAP 416* 396* 425* 474* 472*    Recent Results (from the past 240 hour(s))  Culture, blood (Routine X 2) w Reflex to ID Panel     Status: None (Preliminary result)   Collection Time: 04/12/20  1:10 PM   Specimen: BLOOD  Result Value Ref Range Status   Specimen Description   Final    BLOOD RIGHT ARM Performed at [facility], [address], [place], [place] [address]    Special Requests   Final    BOTTLES DRAWN AEROBIC AND ANAEROBIC Blood Culture adequate volume Performed at [facility], [address], [place], [place] [address]    Culture   Final    NO GROWTH 4 DAYS Performed at [facility], [address], [place], [place] [address]    Report Status PENDING  Incomplete  Culture, blood  (Routine X 2) w Reflex to ID Panel     Status: None (Preliminary result)   Collection Time: 04/12/20  1:15 PM   Specimen: BLOOD  Result Value Ref Range Status   Specimen Description   Final    BLOOD LEFT ARM Performed at [facility], [address], [place], [place] [address]    Special Requests   Final    BOTTLES DRAWN AEROBIC AND ANAEROBIC Blood Culture adequate volume Performed at [facility], [address], [place], [place] [address]    Culture   Final    NO GROWTH 4 DAYS Performed at [facility], [address], [place], [place] [address]    Report Status PENDING  Incomplete  Urine Culture     Status: None   Collection Time: 04/12/20  3:01 PM   Specimen: Urine, Random  Result Value Ref Range Status   Specimen Description   Final    URINE, RANDOM Performed at [facility], [address], [place], [place] [address]    Special Requests   Final    NONE Performed at [facility], [address] [place], [place] [address]    Culture   Final    NO GROWTH Performed at [facility], [place] [address] [place], [place] [address]    Report Status 04/14/2020 FINAL  Final  Resp Panel by RT-PCR (Flu A&B, Covid) Nasopharyngeal Swab     Status: None   Collection Time: 04/28/2020  5:56 PM   Specimen: Nasopharyngeal Swab; Nasopharyngeal(NP) swabs in vial transport medium  Result Value Ref Range Status   SARS Coronavirus 2 by RT PCR NEGATIVE NEGATIVE Final    Comment: (NOTE) SARS-CoV-2 target nucleic acids are NOT DETECTED.  The SARS-CoV-2 RNA is generally detectable in upper respiratory specimens during the acute phase of infection. The lowest concentration of SARS-CoV-2 viral copies this assay can detect is 138 copies/mL. A negative result does not preclude SARS-Cov-2 infection and should not be used as the sole basis for treatment or other patient management decisions. A negative result may occur with  improper specimen  collection/handling, submission of specimen other than nasopharyngeal swab, presence of viral mutation(s) within the areas targeted by this assay, and inadequate number of viral copies(<138 copies/mL). A negative result must be combined with clinical observations, patient history, and epidemiological information. The expected result is Negative.  Fact Sheet for Patients:  EntrepreneurPulse.com.au  Fact Sheet for Healthcare Providers:  IncredibleEmployment.be  This test is no t yet approved or cleared by the  [place] Architectural technologist and  has been authorized for detection and/or diagnosis of SARS-CoV-2 by FDA under an Print production planner (EUA). This EUA will remain  in effect (meaning this test can be used) for the duration of the COVID-19 declaration under Section 564(b)(1) of the Act, 21 U.S.C.section 360bbb-3(b)(1), unless the authorization is terminated  or revoked sooner.       Influenza A by PCR NEGATIVE NEGATIVE Final   Influenza B by PCR NEGATIVE NEGATIVE Final    Comment: (NOTE) The Xpert Xpress SARS-CoV-2/FLU/RSV plus assay is intended as an aid in the diagnosis of influenza from Nasopharyngeal swab specimens and should not be used as a sole basis for treatment. Nasal washings and aspirates are unacceptable for Xpert Xpress SARS-CoV-2/FLU/RSV testing.  Fact Sheet for Patients: EntrepreneurPulse.com.au  Fact Sheet for Healthcare Providers: IncredibleEmployment.be  This test is not yet approved or cleared by the [place] FDA and has been authorized for detection and/or diagnosis of SARS-CoV-2 by FDA under an Emergency Use Authorization (EUA). This EUA will remain in effect (meaning this test can be used) for the duration of the COVID-19 declaration under Section 564(b)(1) of the Act, 21 U.S.C. section 360bbb-3(b)(1), unless the authorization is terminated or revoked.  Performed at [facility], [address] [place], [place] [address]   Blood Culture (routine x 2)     Status: None (Preliminary result)   Collection Time: 04/29/2020  6:06 PM   Specimen: BLOOD  Result Value Ref Range Status   Specimen Description BLOOD  RIGHT [facility]  Final   Special Requests   Final    BOTTLES DRAWN AEROBIC AND ANAEROBIC Blood Culture adequate volume   Culture   Final    NO GROWTH 3 DAYS Performed at [facility], [address] [place], [place] [address]    Report Status PENDING  Incomplete  Blood Culture (routine x 2)     Status: None (Preliminary result)   Collection Time: 04/22/2020  6:07 PM   Specimen: BLOOD  Result Value Ref Range Status   Specimen Description BLOOD  RIGHT HAND  Final   Special Requests   Final    BOTTLES DRAWN AEROBIC AND ANAEROBIC Blood Culture results may not be optimal due to an inadequate volume of blood received in culture bottles   Culture   Final    NO GROWTH 3 DAYS Performed at [facility], [address] [place], [place] [address]    Report Status PENDING  Incomplete      Scheduled Meds: . insulin aspart  0-15 Units Subcutaneous TID WC  . insulin aspart  0-5 Units Subcutaneous QHS  . insulin aspart  8 Units Subcutaneous TID WC  . [START ON 04/17/2020] insulin glargine  25 Units Subcutaneous Daily  . ipratropium-albuterol  3 mL Nebulization TID  . [START ON 04/17/2020] methylPREDNISolone (SOLU-MEDROL) injection  20 mg Intravenous Daily  . multivitamin with minerals  1 tablet Oral Daily  . valACYclovir  500 mg Oral Daily   Continuous Infusions: . ceFEPime (MAXIPIME) IV 2 g (04/16/20 1307)  . lactated ringers Stopped (04/15/20 0842)  . metronidazole 500 mg (04/16/20 1601)   Brief history patient was admitted 04/17/2020 with weakness.  Past medical history of recently diagnosed AML, laryngeal cancer, A. fib, CVA, hypertension, systolics congestive heart failure, left bundle branch block, COPD.  He presented with weakness.   Admitted with severe sepsis and neutropenic fever.  Started on aggressive antibiotics.  Assessment/Plan:  1. Severe sepsis, present on admission.  Patient admitted with neutropenic fever and aspiration pneumonia.  Patient also had acute kidney injury and acute hypoxic respiratory failure.  Patient had fever, neutropenia, tachycardia and tachypnea on presentation.  Patient was started on aggressive antibiotics with vancomycin, Maxipime and Flagyl.  I got rid of vancomycin since cultures are negative and creatinine started to creep up.  Continue gentle IV fluid hydration.  Continue Solu-Medrol.  Continue nebulizer treatments.  Hopefully we can taper off oxygen. 2. Laryngeal cancer with upper airway congestion.  Continue Solu-Medrol but I have to decrease the dose with his altered mental status.  As needed glycopyrrolate.  Speech therapy advance diet to dysphagia 3 with nectar thick liquids.  As per Dr. Mike Gip, patient wanting to stop radiation treatments. 3. AML with pancytopenia.  Patient not undergoing any treatment for this.  Last white blood cell count 0.6.  Last platelet count 16.  Hemoglobin stable at 8.9.  Oncology following.  Prognosis poor for this. 4. Acute kidney injury on chronic kidney disease stage IIIa.  Creatinine 1.7 on presentation and came down to 1.41.  Creatinine up at 1.95 today.  Get rid of vancomycin.  Continue IV fluids and recheck creatinine again tomorrow.  Yesterday there was a time where he had his IV out where they needed to put in an IV. 5. Chronic atrial fibrillation.  Not on anticoagulation secondary to bleeding risk and low platelets.  Holding Coreg with blood pressure on the lower side. 6. Chronic systolic congestive heart failure on gentle IV fluids.  No signs of heart failure currently.  Holding Lasix and Coreg. 7. Type 2 diabetes mellitus with hyperlipidemia.  Holding cholesterol medication.  Last hemoglobin A1c is 6.5.  Had a start Lantus and sliding scale insulin and  short acting insulin prior to meals secondary to high sugars with steroids. 8. Stage II coccyx decubiti, present on admission.  See full description below 9. History of nonhemorrhagic stroke.  Not giving any antiplatelet agents with platelets being 20.  High risk of bleeding. 10. Weakness.  Physical therapy recommending rehab.  Family leaning on taking patient home 5. Acute metabolic encephalopathy likely secondary to steroids.  Decrease steroid dose.  Pressure Injury 03/12/20 Coccyx Mid Stage 2 -  Partial thickness loss of dermis presenting as a shallow open injury with a red, pink wound bed without slough. white (Active)  03/12/20 1844  Location: Coccyx  Location Orientation: Mid  Staging: Stage 2 -  Partial thickness loss of dermis presenting as a shallow open injury with a red, pink wound bed without slough.  Wound Description (Comments): white  Present on Admission: Yes       Code Status:     Code Status Orders  (From admission, onward)         Start     Ordered   04/28/2020 2159  Do not attempt resuscitation (DNR)  Continuous       Question Answer Comment  In the event of cardiac or respiratory ARREST Do not call a "code blue"   In the event of cardiac or respiratory ARREST Do not perform Intubation, CPR, defibrillation or ACLS   In the event of cardiac or respiratory ARREST Use medication by any route, position, wound care, and other measures to relive pain and suffering. May use oxygen, suction and manual treatment of airway obstruction as needed for comfort.      04/15/2020 2159        Code Status History    Date Active Date Inactive Code Status Order  ID Comments User Context   03/12/2020 0718 03/15/2020 0504 DNR [id number]  Flora Lipps, MD Inpatient   03/11/2020 2206 03/12/2020 0718 Partial Code [id number] Vasopressor administration only Rust-Chester, Huel Cote, NP Inpatient   03/11/2020 1926 03/11/2020 2206 Full Code [id number]  Ivor Costa, MD ED   03/11/2020 1301 03/11/2020 1926  Partial Code [id number] No CPR or intubation, but vassopressor OK per his son Ivor Costa, MD ED   03/11/2020 1301 03/11/2020 1301 Partial Code [id number]  Ivor Costa, MD ED   12/22/2017 1620 12/25/2017 2246 Full Code [id number]  Loletha Grayer, MD Inpatient   10/14/2015 1055 10/15/2015 1625 Full Code [id number]  Theodoro Grist, MD Inpatient   Advance Care Planning Activity    Advance Directive Documentation   Flowsheet Row Most Recent Value  Type of Advance Directive Healthcare Power of Attorney  Pre-existing out of facility DNR order (yellow form or pink MOST form) --  "MOST" Form in Place? --     Family Communication: Spoke with son at the bedside Disposition Plan: Status is: Inpatient  Dispo: The patient is from: Home              Anticipated d/c is to: Home              Patient currently being treated for neutropenic fever with IV antibiotics.  Also requiring IV steroids secondary to laryngeal congestion.   Difficult to place patient.  If they take him home then no.  Consultants:  Oncology  Antibiotics:  Maxipime  Flagyl  Time spent: 28 minutes  [place]

## 2020-04-16 NOTE — Progress Notes (Signed)
Inpatient Diabetes Program Recommendations  AACE/ADA: New Consensus Statement on Inpatient Glycemic Control   Target Ranges:  Prepandial:   less than 140 mg/dL      Peak postprandial:   less than 180 mg/dL (1-2 hours)      Critically ill patients:  140 - 180 mg/dL   Results for Thomas Morton, Thomas Morton (MRN [id number]) as of 04/16/2020 08:21  Ref. Range 04/15/2020 15:51 04/15/2020 21:16 04/16/2020 00:00 04/16/2020 03:02 04/16/2020 04:03 04/16/2020 07:53  Glucose-Capillary Latest Ref Range: 70 - 99 mg/dL 535 (HH) 478 (H) 505 (HH) 416 (H) 396 (H) 425 (H)  Results for Thomas Morton, Thomas Morton (MRN [id number]) as of 04/16/2020 08:21  Ref. Range 03/12/2020 05:23  Hemoglobin A1C Latest Ref Range: 4.8 - 5.6 % 6.5 (H)    Review of Glycemic Control  Diabetes history: DM2 Outpatient Diabetes medications: None Current orders for Inpatient glycemic control: Lantus 15 units daily, Novolog 0-15 units TID with meals, Novolog 0-5 units QHS, Novolog 4 units TID with meals for meal coverage; Solumedrol 40 mg daily  Inpatient Diabetes Program Recommendations:    Insulin: If steroids are continued, please consider increasing Lantus to 25 units daily and meal coverage to Novolog 8 units TID with meals if patient eats at least 50% of meals. Please note that once steroids are tapered, insulin will also need to be tapered.  Outpatient DM medications: If glucose remains elevated may need to consider prescribing DM medication at time of discharge.  NOTE: In reviewing chart, noted patient was inpatient at [facility] 03/14/20-03/18/20 and per notes in [place] "Recent meds switched from Glimepiride to Glipizide d/t hypoglycemia. OSH Hgb A1c 6.5%. Will not resume medications for diabetes on discharge."    Thanks, Barnie Alderman, RN, MSN, CDE Diabetes Coordinator Inpatient Diabetes Program [phone] (Team Pager from 8am to 5pm)

## 2020-04-16 NOTE — Progress Notes (Signed)
PHARMACY NOTE:  ANTIMICROBIAL RENAL DOSAGE ADJUSTMENT  Current antimicrobial regimen includes a mismatch between antimicrobial dosage and estimated renal function.  As per policy approved by the Pharmacy & Therapeutics and Medical Executive Committees, the antimicrobial dosage will be adjusted accordingly.  Current antimicrobial dosage:  Cefepime 2gm IV q12h  Indication: PNA, febrile neutropenia  Renal Function:  Estimated Creatinine Clearance: 25.2 mL/min (A) (by C-G formula based on SCr of 1.95 mg/dL (H)).     Antimicrobial dosage has been changed to:  Cefepime 2 gm IV q24h  (crcl 11-29 ml/min)  Additional comments:   Thank you for allowing pharmacy to be a part of this patient's care.  Chinita Greenland PharmD Clinical Pharmacist 04/16/2020

## 2020-04-16 NOTE — TOC Progression Note (Signed)
Transition of Care [facility]) - Progression Note    Patient Details  Name: PARAM CAPRI MRN: [id number] Date of Birth: 17-Apr-1927  Transition of Care [facility]) CM/SW Contact  Shelbie Hutching, RN Phone Number: 04/16/2020, 3:38 PM  Clinical Narrative:    RNCM spoke with both patient's sons and discussed PT recommendations for SNF for short term rehab.  Both sons don't think the patient will agree to rehab but they will have a discussion with the patient about it.  RNCM will follow up with patient and family tomorrow.    Expected Discharge Plan: [place] Barriers to Discharge: Continued Medical Work up  Expected Discharge Plan and Services Expected Discharge Plan: [place]   Discharge Planning Services: CM Consult   Living arrangements for the past 2 months: Single Family Home                 DME Arranged: N/A                     Social Determinants of Health (SDOH) Interventions    Readmission Risk Interventions Readmission Risk Prevention Plan 04/15/2020  Transportation Screening Complete  PCP or Specialist Appt within 3-5 Days Complete  HRI or [place] Complete  Social Work Consult for [place] Planning/Counseling Complete  Palliative Care Screening Not Applicable  Medication Review Press photographer) Complete  Some recent data might be hidden

## 2020-04-17 ENCOUNTER — Ambulatory Visit: Payer: Medicare HMO

## 2020-04-17 DIAGNOSIS — I5022 Chronic systolic (congestive) heart failure: Secondary | ICD-10-CM | POA: Diagnosis not present

## 2020-04-17 DIAGNOSIS — N1831 Chronic kidney disease, stage 3a: Secondary | ICD-10-CM

## 2020-04-17 DIAGNOSIS — J690 Pneumonitis due to inhalation of food and vomit: Secondary | ICD-10-CM | POA: Diagnosis not present

## 2020-04-17 DIAGNOSIS — J9601 Acute respiratory failure with hypoxia: Secondary | ICD-10-CM | POA: Diagnosis not present

## 2020-04-17 LAB — BASIC METABOLIC PANEL
Anion gap: 8 (ref 5–15)
BUN: 71 mg/dL — ABNORMAL HIGH (ref 8–23)
CO2: 24 mmol/L (ref 22–32)
Calcium: 8.7 mg/dL — ABNORMAL LOW (ref 8.9–10.3)
Chloride: 110 mmol/L (ref 98–111)
Creatinine, Ser: 1.73 mg/dL — ABNORMAL HIGH (ref 0.61–1.24)
GFR, Estimated: 37 mL/min — ABNORMAL LOW (ref >60)
Glucose, Bld: 357 mg/dL — ABNORMAL HIGH (ref 70–99)
Potassium: 4.3 mmol/L (ref 3.5–5.1)
Sodium: 142 mmol/L (ref 135–145)

## 2020-04-17 LAB — CULTURE, BLOOD (ROUTINE X 2)
Culture: NO GROWTH
Culture: NO GROWTH
Special Requests: ADEQUATE
Special Requests: ADEQUATE

## 2020-04-17 LAB — CBC WITH DIFFERENTIAL/PLATELET
Abs Immature Granulocytes: 0.09 10*3/uL — ABNORMAL HIGH (ref 0.00–0.07)
Basophils Absolute: 0 10*3/uL (ref 0.0–0.1)
Basophils Relative: 0 %
Eosinophils Absolute: 0 10*3/uL (ref 0.0–0.5)
Eosinophils Relative: 0 %
HCT: 26.3 % — ABNORMAL LOW (ref 39.0–52.0)
Hemoglobin: 9.1 g/dL — ABNORMAL LOW (ref 13.0–17.0)
Immature Granulocytes: 20 %
Lymphocytes Relative: 31 %
Lymphs Abs: 0.1 10*3/uL — ABNORMAL LOW (ref 0.7–4.0)
MCH: 34.2 pg — ABNORMAL HIGH (ref 26.0–34.0)
MCHC: 34.6 g/dL (ref 30.0–36.0)
MCV: 98.9 fL (ref 80.0–100.0)
Monocytes Absolute: 0.1 10*3/uL (ref 0.1–1.0)
Monocytes Relative: 31 %
Neutro Abs: 0.1 10*3/uL — CL (ref 1.7–7.7)
Neutrophils Relative %: 18 %
Platelets: 11 10*3/uL — CL (ref 150–400)
RBC: 2.66 MIL/uL — ABNORMAL LOW (ref 4.22–5.81)
RDW: 19.4 % — ABNORMAL HIGH (ref 11.5–15.5)
Smear Review: DECREASED
WBC: 0.5 10*3/uL — CL (ref 4.0–10.5)
nRBC: 0 % (ref 0.0–0.2)

## 2020-04-17 LAB — GLUCOSE, CAPILLARY
Glucose-Capillary: 220 mg/dL — ABNORMAL HIGH (ref 70–99)
Glucose-Capillary: 304 mg/dL — ABNORMAL HIGH (ref 70–99)
Glucose-Capillary: 347 mg/dL — ABNORMAL HIGH (ref 70–99)
Glucose-Capillary: 352 mg/dL — ABNORMAL HIGH (ref 70–99)
Glucose-Capillary: 434 mg/dL — ABNORMAL HIGH (ref 70–99)
Glucose-Capillary: 448 mg/dL — ABNORMAL HIGH (ref 70–99)

## 2020-04-17 MED ORDER — SODIUM CHLORIDE 0.9 % IV SOLN
3.0000 g | Freq: Two times a day (BID) | INTRAVENOUS | Status: DC
  Administered 2020-04-18: 3 g via INTRAVENOUS
  Filled 2020-04-17: qty 3

## 2020-04-17 MED ORDER — SODIUM CHLORIDE 0.9 % IV SOLN
3.0000 g | Freq: Three times a day (TID) | INTRAVENOUS | Status: DC
  Administered 2020-04-17: 3 g via INTRAVENOUS
  Filled 2020-04-17 (×4): qty 8

## 2020-04-17 MED ORDER — INSULIN GLARGINE 100 UNIT/ML ~~LOC~~ SOLN
30.0000 [IU] | Freq: Every day | SUBCUTANEOUS | Status: DC
  Filled 2020-04-17: qty 0.3

## 2020-04-17 NOTE — TOC Progression Note (Signed)
Transition of Care [facility]) - Progression Note    Patient Details  Name: Thomas Morton MRN: [id number] Date of Birth: 1927/05/03  Transition of Care [facility]) CM/SW Contact  Shelbie Hutching, RN Phone Number: 04/17/2020, 1:30 PM  Clinical Narrative:    Patient and family agree to SNF and they prefer [facility].  RNCM starting SNF workup and bed search.    Expected Discharge Plan: [place] Barriers to Discharge: Continued Medical Work up  Expected Discharge Plan and Services Expected Discharge Plan: [place]   Discharge Planning Services: CM Consult   Living arrangements for the past 2 months: Single Family Home                 DME Arranged: N/A                     Social Determinants of Health (SDOH) Interventions    Readmission Risk Interventions Readmission Risk Prevention Plan 04/15/2020  Transportation Screening Complete  PCP or Specialist Appt within 3-5 Days Complete  HRI or [place] Complete  Social Work Consult for [place] Planning/Counseling Complete  Palliative Care Screening Not Applicable  Medication Review Press photographer) Complete  Some recent data might be hidden

## 2020-04-17 NOTE — NC FL2 (Signed)
[place] LEVEL OF CARE SCREENING TOOL     IDENTIFICATION  Patient Name: Thomas Morton Birthdate: [birth year] Sex: male Admission Date (Current Location): 05/01/2020  [place] and [place] Number:  Engineering geologist and Address:  [facility], [address], [place], [place] [address]      Provider Number: [id number]  Attending Physician Name and Address:  Sharen Hones, MD  Relative Name and Phone Number:  Dickson Kostelnik (son) [phone]    Current Level of Care: Hospital Recommended Level of Care: [place] Prior Approval Number:    Date Approved/Denied:   PASRR Number: [id number]  Discharge Plan: SNF    Current Diagnoses: Patient Active Problem List   Diagnosis Date Noted  . Neutropenic fever ([place])   . Severe sepsis ([place]) 04/27/2020  . Goals of care, counseling/discussion 04/12/2020  . Symptomatic anemia 04/01/2020  . Cardiac defibrillator in situ 03/27/2020  . Acute myeloid leukemia not having achieved remission ([place])   . Pressure injury of skin 03/13/2020  . Chronic systolic CHF (congestive heart failure) ([place])   . Aspiration pneumonia ([place]) 03/11/2020  . HCAP (healthcare-associated pneumonia) 03/11/2020  . Severe sepsis with septic shock ([place]) 03/11/2020  . Acute kidney injury superimposed on CKD ([place]) 03/11/2020  . GERD (gastroesophageal reflux disease)   . Chronic gouty arthritis   . History of cardioembolic cerebrovascular accident (CVA)   . Hyperlipidemia   . Type 2 diabetes mellitus with hyperlipidemia ([place])   . Atrial fibrillation, chronic ([place])   . Pancytopenia ([place])   . Elevated troponin   . Laryngeal cancer ([place])   . Mild emphysema ([place]) 03/31/2018  . Acute respiratory failure with hypoxemia ([place]) 12/22/2017  . LBBB (left bundle branch block) 04/29/2016  . B12 deficiency 02/14/2016  . Asymptomatic bilateral carotid artery stenosis 10/24/2015  . Aortic atherosclerosis ([place]) 10/20/2015  .  History of CVA (cerebrovascular accident) 10/20/2015  . Dizziness and giddiness 10/15/2015  . CVA (cerebral vascular accident) ([place]) 10/14/2015  . Numbness 10/14/2015  . Chronic renal insufficiency 10/14/2015  . Essential hypertension 10/14/2015  . Diabetic sensorimotor neuropathy ([place]) 02/13/2015  . History of prostate cancer 02/08/2014  . DM (diabetes mellitus) type II controlled, neurological manifestation ([place]) 08/04/2013  . Encounter for fitting or adjustment of implantable cardioverter-defibrillator (ICD) 03/30/2012  . History of DVT (deep vein thrombosis) 03/29/2012  . H/O prostatectomy 01/12/1994    Orientation RESPIRATION BLADDER Height & Weight     Self,Time,Situation,Place  Normal External catheter Weight: 78.7 kg Height:  5\' 9"  (175.3 cm)  BEHAVIORAL SYMPTOMS/MOOD NEUROLOGICAL BOWEL NUTRITION STATUS      Continent Diet (Dysphagia diet 3 - nectar thick liquids)  AMBULATORY STATUS COMMUNICATION OF NEEDS Skin   Limited Assist Verbally Bruising (arms)                       Personal Care Assistance Level of Assistance  Bathing,Feeding,Dressing Bathing Assistance: Limited assistance Feeding assistance: Limited assistance Dressing Assistance: Limited assistance     Functional Limitations Info             SPECIAL CARE FACTORS FREQUENCY  PT (By licensed PT),OT (By licensed OT),Speech therapy     PT Frequency: 5 times per week OT Frequency: 5 time per week     Speech Therapy Frequency: 2 times per week      Contractures Contractures Info: Not present    Additional Factors Info  Code Status,Allergies Code Status Info: DNR Allergies Info: Sulfa, levofloxacin  Current Medications (04/17/2020):  This is the current hospital active medication list Current Facility-Administered Medications  Medication Dose Route Frequency Provider Last Rate Last Admin  . acetaminophen (TYLENOL) tablet 650 mg  650 mg Oral Q6H PRN Clarnce Flock, MD       Or   . acetaminophen (TYLENOL) suppository 650 mg  650 mg Rectal Q6H PRN Clarnce Flock, MD      . Ampicillin-Sulbactam (UNASYN) 3 g in sodium chloride 0.9 % 100 mL IVPB  3 g Intravenous Q8H Zhang, Danford Bad, MD      . glycopyrrolate (ROBINUL) injection 0.4 mg  0.4 mg Intravenous Q6H PRN Loletha Grayer, MD   0.4 mg at 04/16/20 1303  . insulin aspart (novoLOG) injection 0-15 Units  0-15 Units Subcutaneous TID WC Loletha Grayer, MD   15 Units at 04/17/20 1247  . insulin aspart (novoLOG) injection 0-5 Units  0-5 Units Subcutaneous QHS Judd Gaudier V, MD      . insulin aspart (novoLOG) injection 8 Units  8 Units Subcutaneous TID WC Loletha Grayer, MD   8 Units at 04/17/20 1248  . [START ON 04/18/2020] insulin glargine (LANTUS) injection 30 Units  30 Units Subcutaneous Daily Sharen Hones, MD      . ipratropium-albuterol (DUONEB) 0.5-2.5 (3) MG/3ML nebulizer solution 3 mL  3 mL Nebulization TID Loletha Grayer, MD   3 mL at 04/17/20 0814  . lactated ringers infusion   Intravenous Continuous Loletha Grayer, MD 50 mL/hr at 04/16/20 1708 New Bag at 04/16/20 1708  . lidocaine (XYLOCAINE) 2 % viscous mouth solution 15 mL  15 mL Mouth/Throat Q4H PRN Clarnce Flock, MD   15 mL at 04/16/20 0943  . multivitamin with minerals tablet 1 tablet  1 tablet Oral Daily Loletha Grayer, MD   1 tablet at 04/17/20 1018  . ondansetron (ZOFRAN) tablet 4 mg  4 mg Oral Q6H PRN Clarnce Flock, MD       Or  . ondansetron [facility]) injection 4 mg  4 mg Intravenous Q6H PRN Clarnce Flock, MD      . oxyCODONE (Oxy IR/ROXICODONE) immediate release tablet 5 mg  5 mg Oral Q4H PRN Clarnce Flock, MD   5 mg at 04/15/20 1245  . phenol (CHLORASEPTIC) mouth spray 1 spray  1 spray Mouth/Throat PRN Wieting, Richard, MD      . polyethylene glycol (MIRALAX / GLYCOLAX) packet 17 g  17 g Oral Daily PRN Clarnce Flock, MD      . traZODone (DESYREL) tablet 25 mg  25 mg Oral QHS PRN Clarnce Flock, MD   25 mg at 04/14/20  2127  . valACYclovir (VALTREX) tablet 500 mg  500 mg Oral Daily Loletha Grayer, MD   500 mg at 04/17/20 1018     Discharge Medications: Please see discharge summary for a list of discharge medications.  Relevant Imaging Results:  Relevant Lab Results:   Additional Information SS# [id number]  Shelbie Hutching, RN

## 2020-04-17 NOTE — Progress Notes (Signed)
Inpatient Diabetes Program Recommendations  AACE/ADA: New Consensus Statement on Inpatient Glycemic Control   Target Ranges:  Prepandial:   less than 140 mg/dL      Peak postprandial:   less than 180 mg/dL (1-2 hours)      Critically ill patients:  140 - 180 mg/dL   Results for LEVONE, OTTEN (MRN [id number]) as of 04/17/2020 07:46  Ref. Range 04/16/2020 07:53 04/16/2020 11:55 04/16/2020 12:59 04/16/2020 16:45 04/16/2020 21:16 04/17/2020 00:20 04/17/2020 01:40  Glucose-Capillary Latest Ref Range: 70 - 99 mg/dL 425 (H) 474 (H) 472 (H) 489 (H) 413 (H) 304 (H) 352 (H)   Review of Glycemic Control  Diabetes history: DM2 Outpatient Diabetes medications: None Current orders for Inpatient glycemic control: Lantus 25 units daily, Novolog 0-15 units TID with meals, Novolog 0-5 units QHS, Novolog 8 units TID with meals for meal coverage; Solumedrol 40 mg daily  Inpatient Diabetes Program Recommendations:    Insulin: If steroids are continued, please consider increasing Lantus to 30 units daily and meal coverage to Novolog 12 units TID with meals if patient eats at least 50% of meals. Please note that once steroids are tapered, insulin will also need to be tapered.  Outpatient DM medications: If glucose remains elevated may need to consider prescribing DM medication at time of discharge.  NOTE: In reviewing chart, noted patient was inpatient at [facility] 03/14/20-03/18/20 and per notes in [place] "Recent meds switched from Glimepiride to Glipizide d/t hypoglycemia. OSH Hgb A1c 6.5%. Will not resume medications for diabetes on discharge."   Thanks, Barnie Alderman, RN, MSN, CDE Diabetes Coordinator Inpatient Diabetes Program [phone] (Team Pager from 8am to 5pm)

## 2020-04-17 NOTE — Progress Notes (Signed)
PT Cancellation Note  Patient Details Name: Thomas Morton MRN: [id number] DOB: 12-Apr-1927   Cancelled Treatment:     PT attempt. PT hold. Pt more lethargic today and continues to have poor lab values.Noted palliative care consulted. Will continue to follow endless care plan changes going forward.     Willette Pa 04/17/2020, 2:46 PM

## 2020-04-17 NOTE — Progress Notes (Signed)
PHARMACY NOTE:  ANTIMICROBIAL RENAL DOSAGE ADJUSTMENT  Current antimicrobial regimen includes a mismatch between antimicrobial dosage and estimated renal function.  As per policy approved by the Pharmacy & Therapeutics and Medical Executive Committees, the antimicrobial dosage will be adjusted accordingly.  Current antimicrobial dosage:  Unasyn 3 g IV q8h  Indication: Aspiration pneumonia  Renal Function:  Estimated Creatinine Clearance: 28.4 mL/min (A) (by C-G formula based on SCr of 1.73 mg/dL (H)).  Antimicrobial dosage has been changed to:  Unasyn 3 g IV q12h   Thank you for allowing pharmacy to be a part of this patient's care.  Benita Gutter, [facility] 04/17/2020 4:34 PM

## 2020-04-17 NOTE — Progress Notes (Signed)
PROGRESS NOTE    Thomas Morton  OIZ:[id number] DOB: [birth year] DOA: 05/04/2020 PCP: Rusty Aus, MD   Chief complaint.  Shortness of breath. Brief Narrative:  Patient is a 87 year old male with recent diagnosis of AML, history of laryngeal cancer, atrial fibrillation, stroke, essential hypertension, chronic systolic congestive heart failure, COPD, who present to the hospital with weakness.  He also had neutropenic fever with a severe sepsis.  He was started on antibiotics initially with vancomycin and cefepime and Flagyl.  4/6.  Vancomycin discontinued yesteray.  Patient has a clear evidence of aspiration pneumonia, antibiotic changed to Unasyn.  Obtain palliative care consult.   Assessment & Plan:   Active Problems:   Chronic renal insufficiency   Essential hypertension   Acute respiratory failure with hypoxemia (HCC)   Aspiration pneumonia (HCC)   Acute kidney injury superimposed on CKD (HCC)   History of cardioembolic cerebrovascular accident (CVA)   Atrial fibrillation, chronic (HCC)   Pancytopenia (HCC)   Laryngeal cancer (HCC)   Chronic systolic CHF (congestive heart failure) (HCC)   Acute myeloid leukemia not having achieved remission (HCC)   Cardiac defibrillator in situ   History of prostate cancer   LBBB (left bundle branch block)   Mild emphysema (HCC)   Severe sepsis ([place])   Neutropenic fever ([place])  #1.  Severe sepsis secondary to neutropenia and aspiration pneumonia. Neutropenic fever. Bilateral lower lobe aspiration pneumonia. Patient has persistent neutropenia.  Probably from AML.  But fever has been coming down.  Blood culture negative. Patient has a clear evidence of aspiration, condition most likely caused by aspiration pneumonia.  Antibiotic switched to Unasyn.  2.  AML with pancytopenia. Patient has severe neutropenia, severe thrombocytopenia, and anemia.  Patient has been evaluated by oncology, patient has determined not to have any further work-up  or chemotherapy.  Long-term prognosis very poor.  #3.  Laryngeal cancer. Recurrent aspiration due to laryngeal cancer. Patient has been seen by speech therapy, patient has profound aspiration.  Patient currently on pured diet and thickened liquid.  During my observation of the patient, patient cannot clear up his airway.  He has constant aspiration. He was treated with steroids for RV spasm.  That is seem to be better.  I will discontinue steroids due to profound elevation of glucose.  #4.  Chronic kidney disease stage IIIa. Creatinine went up to 1.95, does not meet criteria for acute kidney injury.  #5.  Chronic atrial fibrillation. Chronic systolic congestive heart failure. Not on anticoagulation due to severe thrombocytopenia. Currently does not have significant volume overload.  6.  Type 2 diabetes uncontrolled with hyperglycemia. Patient has hemoglobin A1c of 6.5, but a severe hyperglycemia due to steroid use.  Since patient upper airway spasm has improved, I will discontinue steroids.  Increase Lantus dose slightly from yesterday.  7.  Acute metabolic cephalopathy. Condition appears to be stable, has some confusion, but no agitation.  Due to multiple medical problems, especially AML and recurrent aspiration, patient long-term prognosis is very poor.  High risk of mortality from recurrent aspiration.  Will obtain palliative care consult.  Potentially can be hospice candidate.       DVT prophylaxis: SCDs Code Status: DNR Family Communication: Son updated at the bedside. Disposition Plan:  .   Status is: Inpatient  Remains inpatient appropriate because:Inpatient level of care appropriate due to severity of illness   Dispo: The patient is from: Home              Anticipated  d/c is to: pending              Patient currently is not medically stable to d/c.   Difficult to place patient No        I/O last 3 completed shifts: In: 1847.2 [I.V.:812.4; IV  Piggyback:1034.8] Out: 1300 [Urine:1300] Total I/O In: 448 [P.O.:60; I.V.:388] Out: -      Consultants:   None  Procedures: None  Antimicrobials: Unasyn.  Subjective: I observe the patient while talking to his son, patient appeared to have increased airway secretion, he has a weak cough, he cannot clear up his airway.  However, he does not feel any short of breath.  He denies any chest pain or palpitation. No abdominal pain or nausea vomiting. No fever chills. No dysuria hematuria.  Objective: Vitals:   04/16/20 2333 04/17/20 0409 04/17/20 0900 04/17/20 1236  BP: 111/69 (!) 154/86 136/81 123/67  Pulse: 92 89 90 (!) 103  Resp: 18 (!) 22  18  Temp: (!) 97.5 F (36.4 C) 97.6 F (36.4 C) 97.7 F (36.5 C) 97.9 F (36.6 C)  TempSrc: Oral Oral Oral   SpO2: 97% 98% 98% 95%  Weight:      Height:        Intake/Output Summary (Last 24 hours) at 04/17/2020 1336 Last data filed at 04/17/2020 1027 Gross per 24 hour  Intake 448.01 ml  Output 900 ml  Net -451.99 ml   Filed Weights   04/15/2020 1757 04/14/20 0451  Weight: 75.8 kg 78.7 kg    Examination:  General exam: Appears calm and comfortable  Respiratory system: Clear to auscultation. Respiratory effort normal. Cardiovascular system: S1 & S2 heard, RRR. No JVD, murmurs, rubs, gallops or clicks. No pedal edema. Gastrointestinal system: Abdomen is nondistended, soft and nontender. No organomegaly or masses felt. Normal bowel sounds heard. Central nervous system: Alert and oriented. No focal neurological deficits. Extremities: Symmetric 5 x 5 power. Skin: No rashes, lesions or ulcers Psychiatry: Judgement and insight appear normal. Mood & affect appropriate.     Data Reviewed: I have personally reviewed following labs and imaging studies  CBC: Recent Labs  Lab 04/11/20 1031 04/12/20 1311 04/17/2020 1806 04/14/20 0736 04/15/20 0515 04/16/20 0413 04/17/20 0447  WBC 1.8* 1.6* 1.8* 1.4* 0.6* 0.6* 0.5*  NEUTROABS  0.0* 0.1* 0.1*  --   --  0.1* 0.1*  HGB 8.1* 7.4* 8.8* 7.8* 7.9* 8.9* 9.1*  HCT 23.8* 22.4* 26.2* 23.3* 23.4* 26.5* 26.3*  MCV 100.0 100.4* 98.1 99.1 99.6 99.6 98.9  PLT 36* 28* 31* 23* 20* 16* 11*   Basic Metabolic Panel: Recent Labs  Lab 04/18/2020 1806 04/14/20 0736 04/15/20 0514 04/16/20 0051 04/17/20 0447  NA 136 137 138 139 142  K 4.3 4.1 4.3 3.9 4.3  CL 100 102 106 104 110  CO2 27 26 26 24 24   GLUCOSE 236* 240* 415* 493* 357*  BUN 34* 31* 44* 69* 71*  CREATININE 1.70* 1.61* 1.41* 1.95* 1.73*  CALCIUM 8.8* 8.1* 8.1* 8.4* 8.7*   GFR: Estimated Creatinine Clearance: 28.4 mL/min (A) (by C-G formula based on SCr of 1.73 mg/dL (H)). Liver Function Tests: Recent Labs  Lab 05/05/2020 1806 04/14/20 0736  AST 20 16  ALT 12 12  ALKPHOS 69 55  BILITOT 0.8 0.7  PROT 6.9 6.1*  ALBUMIN 3.2* 2.7*   No results for input(s): LIPASE, AMYLASE in the last 168 hours. No results for input(s): AMMONIA in the last 168 hours. Coagulation Profile: Recent Labs  Lab 04/22/2020  1806  INR 1.2   Cardiac Enzymes: No results for input(s): CKTOTAL, CKMB, CKMBINDEX, TROPONINI in the last 168 hours. BNP (last 3 results) No results for input(s): PROBNP in the last 8760 hours. HbA1C: No results for input(s): HGBA1C in the last 72 hours. CBG: Recent Labs  Lab 04/16/20 2116 04/17/20 0020 04/17/20 0140 04/17/20 0808 04/17/20 1132  GLUCAP 413* 304* 352* 347* 448*   Lipid Profile: No results for input(s): CHOL, HDL, LDLCALC, TRIG, CHOLHDL, LDLDIRECT in the last 72 hours. Thyroid Function Tests: No results for input(s): TSH, T4TOTAL, FREET4, T3FREE, THYROIDAB in the last 72 hours. Anemia Panel: No results for input(s): VITAMINB12, FOLATE, FERRITIN, TIBC, IRON, RETICCTPCT in the last 72 hours. Sepsis Labs: Recent Labs  Lab 05/06/2020 1956 05/01/2020 2000 04/14/20 0736  PROCALCITON  --   --  0.14  LATICACIDVEN 1.9 1.2  --     Recent Results (from the past 240 hour(s))  Culture, blood  (Routine X 2) w Reflex to ID Panel     Status: None   Collection Time: 04/12/20  1:10 PM   Specimen: BLOOD  Result Value Ref Range Status   Specimen Description   Final    BLOOD RIGHT ARM Performed at [facility], [address] [place], [place] [address]    Special Requests   Final    BOTTLES DRAWN AEROBIC AND ANAEROBIC Blood Culture adequate volume Performed at [facility], [address], [place], [place] [address]    Culture   Final    NO GROWTH 5 DAYS Performed at [facility], [address] [place], [place] [address]    Report Status 04/17/2020 FINAL  Final  Culture, blood (Routine X 2) w Reflex to ID Panel     Status: None   Collection Time: 04/12/20  1:15 PM   Specimen: BLOOD  Result Value Ref Range Status   Specimen Description   Final    BLOOD LEFT ARM Performed at [facility], [address], [place], [place] [address]    Special Requests   Final    BOTTLES DRAWN AEROBIC AND ANAEROBIC Blood Culture adequate volume Performed at [facility], [address], [place], [place] [address]    Culture   Final    NO GROWTH 5 DAYS Performed at [facility], [address] [place], [place] [address]    Report Status 04/17/2020 FINAL  Final  Urine Culture     Status: None   Collection Time: 04/12/20  3:01 PM   Specimen: Urine, Random  Result Value Ref Range Status   Specimen Description   Final    URINE, RANDOM Performed at [facility], [address] [place], [place] [address]    Special Requests   Final    NONE Performed at [facility], [address], [place], [place] [address]    Culture   Final    NO GROWTH Performed at [facility], [place] [address] [place], [place] [address]    Report Status 04/14/2020 FINAL  Final  Resp Panel by RT-PCR (Flu A&B, Covid) Nasopharyngeal Swab     Status: None   Collection Time: 04/16/2020  5:56 PM   Specimen: Nasopharyngeal Swab; Nasopharyngeal(NP)  swabs in vial transport medium  Result Value Ref Range Status   SARS Coronavirus 2 by RT PCR NEGATIVE NEGATIVE Final    Comment: (NOTE) SARS-CoV-2 target nucleic acids are NOT DETECTED.  The SARS-CoV-2 RNA is generally detectable in upper respiratory specimens  during the acute phase of infection. The lowest concentration of SARS-CoV-2 viral copies this assay can detect is 138 copies/mL. A negative result does not preclude SARS-Cov-2 infection and should not be used as the sole basis for treatment or other patient management decisions. A negative result may occur with  improper specimen collection/handling, submission of specimen other than nasopharyngeal swab, presence of viral mutation(s) within the areas targeted by this assay, and inadequate number of viral copies(<138 copies/mL). A negative result must be combined with clinical observations, patient history, and epidemiological information. The expected result is Negative.  Fact Sheet for Patients:  EntrepreneurPulse.com.au  Fact Sheet for Healthcare Providers:  IncredibleEmployment.be  This test is no t yet approved or cleared by the [place] FDA and  has been authorized for detection and/or diagnosis of SARS-CoV-2 by FDA under an Emergency Use Authorization (EUA). This EUA will remain  in effect (meaning this test can be used) for the duration of the COVID-19 declaration under Section 564(b)(1) of the Act, 21 U.S.C.section 360bbb-3(b)(1), unless the authorization is terminated  or revoked sooner.       Influenza A by PCR NEGATIVE NEGATIVE Final   Influenza B by PCR NEGATIVE NEGATIVE Final    Comment: (NOTE) The Xpert Xpress SARS-CoV-2/FLU/RSV plus assay is intended as an aid in the diagnosis of influenza from Nasopharyngeal swab specimens and should not be used as a sole basis for treatment. Nasal washings and aspirates are unacceptable for Xpert Xpress  SARS-CoV-2/FLU/RSV testing.  Fact Sheet for Patients: EntrepreneurPulse.com.au  Fact Sheet for Healthcare Providers: IncredibleEmployment.be  This test is not yet approved or cleared by the [place] FDA and has been authorized for detection and/or diagnosis of SARS-CoV-2 by FDA under an Emergency Use Authorization (EUA). This EUA will remain in effect (meaning this test can be used) for the duration of the COVID-19 declaration under Section 564(b)(1) of the Act, 21 U.S.C. section 360bbb-3(b)(1), unless the authorization is terminated or revoked.  Performed at [facility], [address] [place], [place] [address]   Blood Culture (routine x 2)     Status: None (Preliminary result)   Collection Time: 04/30/2020  6:06 PM   Specimen: BLOOD  Result Value Ref Range Status   Specimen Description BLOOD  RIGHT [facility]  Final   Special Requests   Final    BOTTLES DRAWN AEROBIC AND ANAEROBIC Blood Culture adequate volume   Culture   Final    NO GROWTH 4 DAYS Performed at [facility], [address] [place], [place] [address]    Report Status PENDING  Incomplete  Blood Culture (routine x 2)     Status: None (Preliminary result)   Collection Time: 05/05/2020  6:07 PM   Specimen: BLOOD  Result Value Ref Range Status   Specimen Description BLOOD  RIGHT HAND  Final   Special Requests   Final    BOTTLES DRAWN AEROBIC AND ANAEROBIC Blood Culture results may not be optimal due to an inadequate volume of blood received in culture bottles   Culture   Final    NO GROWTH 4 DAYS Performed at [facility], [address] [place], [place] [address]    Report Status PENDING  Incomplete         Radiology Studies: No results found.      Scheduled Meds: . insulin aspart  0-15 Units Subcutaneous TID WC  . insulin aspart  0-5 Units Subcutaneous QHS  . insulin aspart  8 Units Subcutaneous TID WC  . [  START ON 04/18/2020]  insulin glargine  30 Units Subcutaneous Daily  . ipratropium-albuterol  3 mL Nebulization TID  . multivitamin with minerals  1 tablet Oral Daily  . valACYclovir  500 mg Oral Daily   Continuous Infusions: . ampicillin-sulbactam (UNASYN) IV    . lactated ringers 50 mL/hr at 04/16/20 1708     LOS: 4 days    Time spent: 35 minutes    Sharen Hones, MD [facility]   To contact the attending provider between 7A-7P or the covering provider during after hours 7P-7A, please log into the web site [email or web address] and access using universal [place] password for that web site. If you do not have the password, please call the hospital operator.  04/17/2020, 1:36 PM

## 2020-04-18 ENCOUNTER — Inpatient Hospital Stay: Payer: Medicare HMO

## 2020-04-18 ENCOUNTER — Ambulatory Visit: Payer: Medicare HMO

## 2020-04-18 DIAGNOSIS — R509 Fever, unspecified: Secondary | ICD-10-CM | POA: Diagnosis not present

## 2020-04-18 DIAGNOSIS — J189 Pneumonia, unspecified organism: Secondary | ICD-10-CM | POA: Diagnosis not present

## 2020-04-18 DIAGNOSIS — A419 Sepsis, unspecified organism: Principal | ICD-10-CM

## 2020-04-18 DIAGNOSIS — C9200 Acute myeloblastic leukemia, not having achieved remission: Secondary | ICD-10-CM | POA: Diagnosis not present

## 2020-04-18 DIAGNOSIS — J690 Pneumonitis due to inhalation of food and vomit: Secondary | ICD-10-CM | POA: Diagnosis not present

## 2020-04-18 DIAGNOSIS — J9601 Acute respiratory failure with hypoxia: Secondary | ICD-10-CM | POA: Diagnosis not present

## 2020-04-18 DIAGNOSIS — R059 Cough, unspecified: Secondary | ICD-10-CM | POA: Diagnosis not present

## 2020-04-18 LAB — CBC WITH DIFFERENTIAL/PLATELET
Abs Immature Granulocytes: 0 10*3/uL (ref 0.00–0.07)
Basophils Absolute: 0 10*3/uL (ref 0.0–0.1)
Basophils Relative: 0 %
Eosinophils Absolute: 0 10*3/uL (ref 0.0–0.5)
Eosinophils Relative: 0 %
HCT: 27.6 % — ABNORMAL LOW (ref 39.0–52.0)
Hemoglobin: 9.3 g/dL — ABNORMAL LOW (ref 13.0–17.0)
Immature Granulocytes: 0 %
Lymphocytes Relative: 30 %
Lymphs Abs: 0.2 10*3/uL — ABNORMAL LOW (ref 0.7–4.0)
MCH: 32.7 pg (ref 26.0–34.0)
MCHC: 33.7 g/dL (ref 30.0–36.0)
MCV: 97.2 fL (ref 80.0–100.0)
Monocytes Absolute: 0.2 10*3/uL (ref 0.1–1.0)
Monocytes Relative: 31 %
Neutro Abs: 0.3 10*3/uL — CL (ref 1.7–7.7)
Neutrophils Relative %: 39 %
Platelets: 8 10*3/uL — CL (ref 150–400)
RBC: 2.84 MIL/uL — ABNORMAL LOW (ref 4.22–5.81)
RDW: 19.3 % — ABNORMAL HIGH (ref 11.5–15.5)
Smear Review: NORMAL
WBC: 0.7 10*3/uL — CL (ref 4.0–10.5)
nRBC: 0 % (ref 0.0–0.2)

## 2020-04-18 LAB — CULTURE, BLOOD (ROUTINE X 2)
Culture: NO GROWTH
Culture: NO GROWTH
Special Requests: ADEQUATE

## 2020-04-18 LAB — BASIC METABOLIC PANEL
Anion gap: 8 (ref 5–15)
BUN: 67 mg/dL — ABNORMAL HIGH (ref 8–23)
CO2: 27 mmol/L (ref 22–32)
Calcium: 8.7 mg/dL — ABNORMAL LOW (ref 8.9–10.3)
Chloride: 114 mmol/L — ABNORMAL HIGH (ref 98–111)
Creatinine, Ser: 1.39 mg/dL — ABNORMAL HIGH (ref 0.61–1.24)
GFR, Estimated: 48 mL/min — ABNORMAL LOW (ref >60)
Glucose, Bld: 51 mg/dL — ABNORMAL LOW (ref 70–99)
Potassium: 4.1 mmol/L (ref 3.5–5.1)
Sodium: 149 mmol/L — ABNORMAL HIGH (ref 135–145)

## 2020-04-18 LAB — GLUCOSE, CAPILLARY
Glucose-Capillary: 41 mg/dL — CL (ref 70–99)
Glucose-Capillary: 122 mg/dL — ABNORMAL HIGH (ref 70–99)
Glucose-Capillary: 117 mg/dL — ABNORMAL HIGH (ref 70–99)

## 2020-04-18 LAB — MAGNESIUM: Magnesium: 2 mg/dL (ref 1.7–2.4)

## 2020-04-18 LAB — ASPERGILLUS ANTIGEN, BAL/SERUM: Aspergillus Ag, BAL/Serum: 0.02 Index (ref 0.00–0.49)

## 2020-04-18 MED ORDER — ALBUTEROL SULFATE (2.5 MG/3ML) 0.083% IN NEBU
INHALATION_SOLUTION | RESPIRATORY_TRACT | Status: AC
  Administered 2020-04-18: 2.5 mg
  Filled 2020-04-18: qty 3

## 2020-04-18 MED ORDER — MORPHINE 100MG IN NS 100ML (1MG/ML) PREMIX INFUSION: 5.0000 mg/h | INTRAVENOUS | Status: DC

## 2020-04-18 MED ORDER — ACETAMINOPHEN 325 MG PO TABS
650.0000 mg | ORAL_TABLET | Freq: Once | ORAL | Status: AC
  Administered 2020-04-18: 650 mg via ORAL
  Filled 2020-04-18: qty 2

## 2020-04-18 MED ORDER — HALOPERIDOL LACTATE 5 MG/ML IJ SOLN: 0.5000 mg | INTRAMUSCULAR | Status: DC | PRN

## 2020-04-18 MED ORDER — SODIUM CHLORIDE 0.9 % IV SOLN
3.0000 g | Freq: Four times a day (QID) | INTRAVENOUS | Status: DC
  Administered 2020-04-18: 09:00:00 3 g via INTRAVENOUS
  Filled 2020-04-18 (×3): qty 8

## 2020-04-18 MED ORDER — MORPHINE SULFATE (CONCENTRATE) 10 MG/0.5ML PO SOLN
5.0000 mg | ORAL | Status: DC | PRN
  Administered 2020-04-18 (×4): 5 mg via SUBLINGUAL
  Filled 2020-04-18 (×3): qty 0.5

## 2020-04-18 MED ORDER — MORPHINE SULFATE (CONCENTRATE) 10 MG/0.5ML PO SOLN
5.0000 mg | ORAL | Status: DC | PRN
  Filled 2020-04-18: qty 0.5

## 2020-04-18 MED ORDER — DIPHENHYDRAMINE HCL 25 MG PO CAPS
25.0000 mg | ORAL_CAPSULE | Freq: Once | ORAL | Status: AC
Start: 2020-04-18 — End: 2020-04-18
  Administered 2020-04-18: 25 mg via ORAL
  Filled 2020-04-18: qty 1

## 2020-04-18 MED ORDER — ALBUTEROL SULFATE (2.5 MG/3ML) 0.083% IN NEBU
2.5000 mg | INHALATION_SOLUTION | Freq: Once | RESPIRATORY_TRACT | Status: DC
  Filled 2020-04-18: qty 3

## 2020-04-18 MED ORDER — HALOPERIDOL LACTATE 2 MG/ML PO CONC
0.5000 mg | ORAL | Status: DC | PRN
  Filled 2020-04-18: qty 0.3

## 2020-04-18 MED ORDER — MORPHINE BOLUS VIA INFUSION
5.0000 mg | Freq: Once | INTRAVENOUS | Status: AC
  Administered 2020-04-19: 5 mg via INTRAVENOUS
  Filled 2020-04-18: qty 5

## 2020-04-18 MED ORDER — DEXTROSE 50 % IV SOLN
INTRAVENOUS | Status: AC
  Administered 2020-04-18: 06:00:00 50 mL
  Filled 2020-04-18: qty 50

## 2020-04-18 MED ORDER — INSULIN ASPART 100 UNIT/ML ~~LOC~~ SOLN: 4.0000 [IU] | Freq: Three times a day (TID) | SUBCUTANEOUS | Status: DC

## 2020-04-18 MED ORDER — SODIUM CHLORIDE 0.9% FLUSH: 10.0000 mL | INTRAVENOUS | Status: DC | PRN

## 2020-04-18 MED ORDER — GLYCOPYRROLATE 0.2 MG/ML IJ SOLN
0.4000 mg | INTRAMUSCULAR | Status: DC | PRN
  Administered 2020-04-18 – 2020-04-19 (×2): 0.4 mg via INTRAVENOUS
  Filled 2020-04-18 (×2): qty 2

## 2020-04-18 MED ORDER — MORPHINE 100MG IN NS 100ML (1MG/ML) PREMIX INFUSION
10.0000 mg/h | INTRAVENOUS | Status: DC
  Administered 2020-04-18: 23:00:00 5 mg/h via INTRAVENOUS
  Filled 2020-04-18: qty 100

## 2020-04-18 MED ORDER — HEPARIN SOD (PORK) LOCK FLUSH 100 UNIT/ML IV SOLN: 250.0000 [IU] | INTRAVENOUS | Status: DC | PRN

## 2020-04-18 MED ORDER — MORPHINE SULFATE (PF) 4 MG/ML IV SOLN: 6.0000 mg | INTRAVENOUS | Status: DC | PRN

## 2020-04-18 MED ORDER — HEPARIN SOD (PORK) LOCK FLUSH 100 UNIT/ML IV SOLN
500.0000 [IU] | Freq: Every day | INTRAVENOUS | Status: DC | PRN
Start: 2020-04-18 — End: 2020-04-19

## 2020-04-18 MED ORDER — MORPHINE SULFATE (CONCENTRATE) 10 MG/0.5ML PO SOLN: 7.5000 mg | ORAL | Status: DC | PRN

## 2020-04-18 MED ORDER — SODIUM CHLORIDE 0.9% FLUSH
3.0000 mL | INTRAVENOUS | Status: DC | PRN
Start: 2020-04-18 — End: 2020-04-19

## 2020-04-18 MED ORDER — GLYCOPYRROLATE 0.2 MG/ML IJ SOLN: 0.4000 mg | INTRAMUSCULAR | Status: DC

## 2020-04-18 MED ORDER — INSULIN GLARGINE 100 UNIT/ML ~~LOC~~ SOLN
15.0000 [IU] | Freq: Every day | SUBCUTANEOUS | Status: DC
  Filled 2020-04-18: qty 0.15

## 2020-04-18 MED ORDER — INSULIN GLARGINE 100 UNIT/ML ~~LOC~~ SOLN
10.0000 [IU] | Freq: Every day | SUBCUTANEOUS | Status: DC
  Administered 2020-04-18: 10:00:00 10 [IU] via SUBCUTANEOUS
  Filled 2020-04-18: qty 0.1

## 2020-04-18 MED ORDER — SODIUM CHLORIDE 0.9% IV SOLUTION
250.0000 mL | Freq: Once | INTRAVENOUS | Status: AC
  Administered 2020-04-18: 11:00:00 250 mL via INTRAVENOUS

## 2020-04-18 MED ORDER — MORPHINE SULFATE (PF) 4 MG/ML IV SOLN
4.0000 mg | Freq: Once | INTRAVENOUS | Status: AC
  Administered 2020-04-18: 22:00:00 4 mg via SUBCUTANEOUS
  Filled 2020-04-18: qty 1

## 2020-04-18 MED ORDER — HALOPERIDOL 0.5 MG PO TABS
0.5000 mg | ORAL_TABLET | ORAL | Status: DC | PRN
  Filled 2020-04-18: qty 1

## 2020-04-18 MED ORDER — FUROSEMIDE 10 MG/ML IJ SOLN
40.0000 mg | Freq: Once | INTRAMUSCULAR | Status: AC
  Administered 2020-04-18: 14:00:00 40 mg via INTRAVENOUS
  Filled 2020-04-18: qty 4

## 2020-04-18 MED ORDER — LORAZEPAM 2 MG/ML IJ SOLN
1.0000 mg | Freq: Once | INTRAMUSCULAR | Status: AC
  Administered 2020-04-19: 1 mg via INTRAVENOUS
  Filled 2020-04-18: qty 1

## 2020-04-18 NOTE — TOC Progression Note (Signed)
Transition of Care [facility]) - Progression Note    Patient Details  Name: Thomas Morton MRN: [id number] Date of Birth: 04-23-1927  [facility] CM/SW Contact  Shelbie Hutching, RN Phone Number: 04/18/2020, 11:46 AM  Clinical Narrative:    Patient has declined over the past couple of day, aspirating.  Family agrees with comfort care and residential hospice placement.  Kieth Brightly with [facility] given hospice referral.    Expected Discharge Plan: [place] Barriers to Discharge: Hospice Bed not available  Expected Discharge Plan and Services Expected Discharge Plan: [place]   Discharge Planning Services: CM Consult Post Acute Care Choice: Hospice Living arrangements for the past 2 months: Single Family Home                 DME Arranged: N/A                     Social Determinants of Health (SDOH) Interventions    Readmission Risk Interventions Readmission Risk Prevention Plan 04/15/2020  Transportation Screening Complete  PCP or Specialist Appt within 3-5 Days Complete  HRI or Home Care Consult Complete  Social Work Consult for [place] Planning/Counseling Complete  Palliative Care Screening Not Applicable  Medication Review Press photographer) Complete  Some recent data might be hidden

## 2020-04-18 NOTE — Progress Notes (Signed)
Cross Cover Patient with continued dyspnea despite increased dose of morphine and change of route from oral to subcutaneous.  Discussed with sone Cerrone Debold over the phone and prefers continuous IV infusion to see if comfort needs can be achieved.

## 2020-04-18 NOTE — Progress Notes (Signed)
PT Cancellation Note  Patient Details Name: Thomas Morton MRN: [id number] DOB: 12-May-1927   Cancelled Treatment:     PT attempt. Pt is not appropriate to participate at this time. Will receive psoralen treated platelet transfusion and has lab values outside PT protocols. Will continue to follow and treat when more appropriate.    Willette Pa 04/18/2020, 9:12 AM

## 2020-04-18 NOTE — Progress Notes (Signed)
Re:  Thrombocytopenia  Platelet count this morning is 8,000.  I spoke with the patient's nurse and blood bank.  Orders written.  He will be transfused with 1 unit psoralen treated platelets.  A 1 hour post platelet count will be obtained.   Lequita Asal, MD

## 2020-04-18 NOTE — Progress Notes (Signed)
PHARMACY NOTE:  ANTIMICROBIAL RENAL DOSAGE ADJUSTMENT  Current antimicrobial regimen includes a mismatch between antimicrobial dosage and estimated renal function.  As per policy approved by the Pharmacy & Therapeutics and Medical Executive Committees, the antimicrobial dosage will be adjusted accordingly.  Current antimicrobial dosage:  Unasyn 3 g IV q12h  Indication: Aspiration pneumonia  Renal Function:  Estimated Creatinine Clearance: 35.3 mL/min (A) (by C-G formula based on SCr of 1.39 mg/dL (H)).  Antimicrobial dosage has been changed to:  Unasyn 3 g IV q6h   Thank you for allowing pharmacy to be a part of this patient's care.  Lu Duffel, PharmD, BCPS Clinical Pharmacist 04/18/2020 7:47 AM

## 2020-04-18 NOTE — Progress Notes (Signed)
Patient with severe sepsis.  Going to hospice care.  Per request of family and his hospitalist, I have turned off the ICD features of his device.  Patient son were in the room at the time and understand what I did.

## 2020-04-18 NOTE — Progress Notes (Signed)
   04/18/20 1015  Assess: MEWS Score  Temp (!) 100.4 F (38 C)  BP (!) 117/54  Pulse Rate (!) 116  Resp (!) 35  SpO2 97 %  O2 Device Room Air  Assess: MEWS Score  MEWS Temp 0  MEWS Systolic 0  MEWS Pulse 2  MEWS RR 2  MEWS LOC 0  MEWS Score 4  MEWS Score Color Red  Assess: if the MEWS score is Yellow or Red  Were vital signs taken at a resting state? Yes  Focused Assessment Change from prior assessment (see assessment flowsheet)  Early Detection of Sepsis Score *See Row Information* High  MEWS guidelines implemented *See Row Information* Yes  Treat  MEWS Interventions Administered scheduled meds/treatments;Administered prn meds/treatments;Escalated (See documentation below);Consulted Respiratory Therapy  Pain Scale 0-10  Pain Score 0  Take Vital Signs  Increase Vital Sign Frequency  Red: Q 1hr X 4 then Q 4hr X 4, if remains red, continue Q 4hrs  Escalate  MEWS: Escalate Red: discuss with charge nurse/RN and provider, consider discussing with RRT  Notify: Charge Nurse/RN  Name of Charge Nurse/RN Notified Debi [place]  Date Charge Nurse/RN Notified 04/18/20  Time Charge Nurse/RN Notified 1015  Notify: Provider  Provider Name/Title Sharen Hones  Date Provider Notified 04/18/20  Time Provider Notified 1030  Notification Type Call  Notification Reason Change in status  Provider response Other (Comment) (portable chest x ray, give platelet transfusion, give 40mg  iv lasix after platelets. patient to go to hospice today)  Date of Provider Response 04/18/20  Time of Provider Response 1030  Document  Patient Outcome Not stable and remains on department;Other (Comment) (patient to go to hospice today per MD)  Progress note created (see row info) Yes

## 2020-04-18 NOTE — Progress Notes (Signed)
Cross Cover Patient with hypoglycemic event of 41 this AM. Treated per protocol. Lantus dose decreased from 30 to 15 units daily

## 2020-04-18 NOTE — Progress Notes (Signed)
Physical Therapy Discharge Patient Details Name: Thomas Morton MRN: [id number] DOB: 06-05-1927 Today's Date: 04/18/2020 Time:  1227     Patient discharged from PT services secondary to going comfort measures. Thanks for allowing PT involvement in this pt's care. Re-consult if anything changes to new plan of care.       Willette Pa 04/18/2020, 12:26 PM

## 2020-04-18 NOTE — Progress Notes (Addendum)
PROGRESS NOTE    Thomas Morton  CVE:[id number] DOB: May 02, 1927 DOA: 04/16/2020 PCP: Rusty Aus, MD   Chief complaint.  Shortness of breath. Brief Narrative:  Patient is a 87 year old male with recent diagnosis of AML, history of laryngeal cancer, atrial fibrillation, stroke, essential hypertension, chronic systolic congestive heart failure, COPD, who present to the hospital with weakness.  He also had neutropenic fever with a severe sepsis.  He was started on antibiotics initially with vancomycin and cefepime and Flagyl.  4/6.  Vancomycin discontinued yesteray.  Patient has clear evidence of aspiration pneumonia, antibiotic changed to Unasyn.  Obtain palliative care consult.    Assessment & Plan:   Active Problems:   Chronic renal insufficiency   Essential hypertension   Acute respiratory failure with hypoxemia (HCC)   Aspiration pneumonia (HCC)   History of cardioembolic cerebrovascular accident (CVA)   Atrial fibrillation, chronic (HCC)   Pancytopenia (HCC)   Laryngeal cancer (HCC)   Chronic systolic CHF (congestive heart failure) (HCC)   Acute myeloid leukemia not having achieved remission (HCC)   Cardiac defibrillator in situ   History of prostate cancer   LBBB (left bundle branch block)   Mild emphysema (HCC)   Severe sepsis ([place])   Neutropenic fever ([place])   Chronic kidney disease, stage 3a ([place])  #1.  Severe sepsis secondary to neutropenia and aspiration pneumonia. Neutropenic fever. Bilateral lower lobe aspiration pneumonia. Acute hypoxemia secondary to aspiration pneumonia Acute metabolic encephalopathy Patient condition not improving, he developed a fever again today, worsening shortness of breath and hypoxia. Patient condition is terminal, had a long discussion with patient son, patient life expectancy probably just a few weeks.  But currently condition deteriorating.  We talked about starting hospice care.  He would talk to his brother, I will meet both of  them later today, most likely will transition to hospice with comfort care. Patient currently is to not resuscitate status.  2.  AML with pancytopenia. Severe neutropenia and thrombocytopenia. Oncology is giving 1 unit of platelets.  We will give IV Lasix after transfusion.  3.  Laryngeal cancer. Recurrent aspiration due to laryngeal cancer. Condition terminal due to recurrent aspiration.  4.  Uncontrolled type 2 diabetes with hyperglycemia and hypoglycemia. Discontinue Lantus.  No need for tight control.  5.  Chronic atrial fibrillation. Chronic diastolic systolic congestive heart failure.  6.  Hypernatremia. Will follow as patient is heading to hospice care.  1110.  Talk to both patient's sons, will transition patient to comfort care, plan to transfer to inpatient hospice.  Due to recurrent aspiration, patient life expectancy is probably less than a week.   DVT prophylaxis: SCDs Code Status: DNR Family Communication: Long discussion with son, condition updated Disposition Plan:  .   Status is: Inpatient  Remains inpatient appropriate because:Inpatient level of care appropriate due to severity of illness   Dispo: The patient is from: Home              Anticipated d/c is to: Home              Patient currently is not medically stable to d/c.   Difficult to place patient No        I/O last 3 completed shifts: In: 0258 [P.O.:120; I.V.:638; IV Piggyback:700] Out: 700 [Urine:700] No intake/output data recorded.     Consultants:   None  Procedures: None  Antimicrobials: Unasyn.  Subjective: Patient had a worsening short of breath, rapid response team called, placed on high flow oxygen.  Also giving albuterol treatment. He spiked a fever again. He does not have any pain. No nausea vomiting. No dysuria hematuria.  Objective: Vitals:   04/18/20 0803 04/18/20 0900 04/18/20 1015 04/18/20 1026  BP: 137/70  (!) 117/54 110/61  Pulse: (!) 110  (!) 116 (!) 117   Resp: 20  (!) 35   Temp: 99.2 F (37.3 C)  (!) 100.4 F (38 C)   TempSrc:      SpO2: 100% 92% 97% 97%  Weight:      Height:        Intake/Output Summary (Last 24 hours) at 04/18/2020 1038 Last data filed at 04/18/2020 0406 Gross per 24 hour  Intake 1010 ml  Output 200 ml  Net 810 ml   Filed Weights   04/17/2020 1757 04/14/20 0451  Weight: 75.8 kg 78.7 kg    Examination:  General exam: Ill-appearing, uncomfortable. Respiratory system: Crackles in the base. Respiratory effort normal. Cardiovascular system: Tachycardic, appear regular. No JVD, murmurs, rubs, gallops or clicks. No pedal edema. Gastrointestinal system: Abdomen is nondistended, soft and nontender. No organomegaly or masses felt. Normal bowel sounds heard. Central nervous system: Drowsy and disorientated. Extremities: Symmetric 5 x 5 power. Skin: No rashes, lesions or ulcers     Data Reviewed: I have personally reviewed following labs and imaging studies  CBC: Recent Labs  Lab 04/12/20 1311 05/09/2020 1806 04/14/20 0736 04/15/20 0515 04/16/20 0413 04/17/20 0447 04/18/20 0445  WBC 1.6* 1.8* 1.4* 0.6* 0.6* 0.5* 0.7*  NEUTROABS 0.1* 0.1*  --   --  0.1* 0.1* 0.3*  HGB 7.4* 8.8* 7.8* 7.9* 8.9* 9.1* 9.3*  HCT 22.4* 26.2* 23.3* 23.4* 26.5* 26.3* 27.6*  MCV 100.4* 98.1 99.1 99.6 99.6 98.9 97.2  PLT 28* 31* 23* 20* 16* 11* 8*   Basic Metabolic Panel: Recent Labs  Lab 04/14/20 0736 04/15/20 0514 04/16/20 0051 04/17/20 0447 04/18/20 0445  NA 137 138 139 142 149*  K 4.1 4.3 3.9 4.3 4.1  CL 102 106 104 110 114*  CO2 26 26 24 24 27   GLUCOSE 240* 415* 493* 357* 51*  BUN 31* 44* 69* 71* 67*  CREATININE 1.61* 1.41* 1.95* 1.73* 1.39*  CALCIUM 8.1* 8.1* 8.4* 8.7* 8.7*  MG  --   --   --   --  2.0   GFR: Estimated Creatinine Clearance: 35.3 mL/min (A) (by C-G formula based on SCr of 1.39 mg/dL (H)). Liver Function Tests: Recent Labs  Lab 04/23/2020 1806 04/14/20 0736  AST 20 16  ALT 12 12  ALKPHOS 69 55   BILITOT 0.8 0.7  PROT 6.9 6.1*  ALBUMIN 3.2* 2.7*   No results for input(s): LIPASE, AMYLASE in the last 168 hours. No results for input(s): AMMONIA in the last 168 hours. Coagulation Profile: Recent Labs  Lab 04/26/2020 1806  INR 1.2   Cardiac Enzymes: No results for input(s): CKTOTAL, CKMB, CKMBINDEX, TROPONINI in the last 168 hours. BNP (last 3 results) No results for input(s): PROBNP in the last 8760 hours. HbA1C: No results for input(s): HGBA1C in the last 72 hours. CBG: Recent Labs  Lab 04/17/20 1537 04/17/20 2139 04/18/20 0613 04/18/20 0641 04/18/20 0855  GLUCAP 434* 220* 41* 122* 117*   Lipid Profile: No results for input(s): CHOL, HDL, LDLCALC, TRIG, CHOLHDL, LDLDIRECT in the last 72 hours. Thyroid Function Tests: No results for input(s): TSH, T4TOTAL, FREET4, T3FREE, THYROIDAB in the last 72 hours. Anemia Panel: No results for input(s): VITAMINB12, FOLATE, FERRITIN, TIBC, IRON, RETICCTPCT in the last 72  hours. Sepsis Labs: Recent Labs  Lab 04/15/2020 1956 05/09/2020 2000 04/14/20 0736  PROCALCITON  --   --  0.14  LATICACIDVEN 1.9 1.2  --     Recent Results (from the past 240 hour(s))  Culture, blood (Routine X 2) w Reflex to ID Panel     Status: None   Collection Time: 04/12/20  1:10 PM   Specimen: BLOOD  Result Value Ref Range Status   Specimen Description   Final    BLOOD RIGHT ARM Performed at [facility], [address], [place], [place] [address]    Special Requests   Final    BOTTLES DRAWN AEROBIC AND ANAEROBIC Blood Culture adequate volume Performed at [facility], [address], [place], [place] [address]    Culture   Final    NO GROWTH 5 DAYS Performed at [facility], [address] [place], [place] [address]    Report Status 04/17/2020 FINAL  Final  Culture, blood (Routine X 2) w Reflex to ID Panel     Status: None   Collection Time: 04/12/20  1:15 PM   Specimen: BLOOD  Result Value Ref Range Status    Specimen Description   Final    BLOOD LEFT ARM Performed at [facility], [address], [place], [place] [address]    Special Requests   Final    BOTTLES DRAWN AEROBIC AND ANAEROBIC Blood Culture adequate volume Performed at [facility], [address], [place], [place] [address]    Culture   Final    NO GROWTH 5 DAYS Performed at [facility], [address] [place], [place] [address]    Report Status 04/17/2020 FINAL  Final  Urine Culture     Status: None   Collection Time: 04/12/20  3:01 PM   Specimen: Urine, Random  Result Value Ref Range Status   Specimen Description   Final    URINE, RANDOM Performed at [facility], [address], [place], [place] [address]    Special Requests   Final    NONE Performed at [facility], [address] [place], [place] [address]    Culture   Final    NO GROWTH Performed at [facility], [place] [address], [place], [place] [address]    Report Status 04/14/2020 FINAL  Final  Resp Panel by RT-PCR (Flu A&B, Covid) Nasopharyngeal Swab     Status: None   Collection Time: 04/23/2020  5:56 PM   Specimen: Nasopharyngeal Swab; Nasopharyngeal(NP) swabs in vial transport medium  Result Value Ref Range Status   SARS Coronavirus 2 by RT PCR NEGATIVE NEGATIVE Final    Comment: (NOTE) SARS-CoV-2 target nucleic acids are NOT DETECTED.  The SARS-CoV-2 RNA is generally detectable in upper respiratory specimens during the acute phase of infection. The lowest concentration of SARS-CoV-2 viral copies this assay Thomas detect is 138 copies/mL. A negative result does not preclude SARS-Cov-2 infection and should not be used as the sole basis for treatment or other patient management decisions. A negative result may occur with  improper specimen collection/handling, submission of specimen other than nasopharyngeal swab, presence of viral mutation(s) within the areas targeted by this assay, and inadequate number of  viral copies(<138 copies/mL). A negative result must be combined with clinical observations, patient history, and epidemiological information. The expected result is Negative.  Fact Sheet for Patients:  EntrepreneurPulse.com.au  Fact Sheet for Healthcare Providers:  IncredibleEmployment.be  This test is no t yet  approved or cleared by the [place] and  has been authorized for detection and/or diagnosis of SARS-CoV-2 by FDA under an Emergency Use Authorization (EUA). This EUA will remain  in effect (meaning this test Thomas be used) for the duration of the COVID-19 declaration under Section 564(b)(1) of the Act, 21 U.S.C.section 360bbb-3(b)(1), unless the authorization is terminated  or revoked sooner.       Influenza A by PCR NEGATIVE NEGATIVE Final   Influenza B by PCR NEGATIVE NEGATIVE Final    Comment: (NOTE) The Xpert Xpress SARS-CoV-2/FLU/RSV plus assay is intended as an aid in the diagnosis of influenza from Nasopharyngeal swab specimens and should not be used as a sole basis for treatment. Nasal washings and aspirates are unacceptable for Xpert Xpress SARS-CoV-2/FLU/RSV testing.  Fact Sheet for Patients: EntrepreneurPulse.com.au  Fact Sheet for Healthcare Providers: IncredibleEmployment.be  This test is not yet approved or cleared by the [place] FDA and has been authorized for detection and/or diagnosis of SARS-CoV-2 by FDA under an Emergency Use Authorization (EUA). This EUA will remain in effect (meaning this test Thomas be used) for the duration of the COVID-19 declaration under Section 564(b)(1) of the Act, 21 U.S.C. section 360bbb-3(b)(1), unless the authorization is terminated or revoked.  Performed at [facility], [address] [place], [place] [address]   Blood Culture (routine x 2)     Status: None   Collection Time: 05/01/2020  6:06 PM   Specimen: BLOOD  Result  Value Ref Range Status   Specimen Description BLOOD  RIGHT [facility]  Final   Special Requests   Final    BOTTLES DRAWN AEROBIC AND ANAEROBIC Blood Culture adequate volume   Culture   Final    NO GROWTH 5 DAYS Performed at [facility], [address] [place], [place] [address]    Report Status 04/18/2020 FINAL  Final  Blood Culture (routine x 2)     Status: None   Collection Time: 04/23/2020  6:07 PM   Specimen: BLOOD  Result Value Ref Range Status   Specimen Description BLOOD  RIGHT HAND  Final   Special Requests   Final    BOTTLES DRAWN AEROBIC AND ANAEROBIC Blood Culture results may not be optimal due to an inadequate volume of blood received in culture bottles   Culture   Final    NO GROWTH 5 DAYS Performed at [facility], [address] [place],  [address]    Report Status 04/18/2020 FINAL  Final  Aspergillus Ag, BAL/Serum     Status: None   Collection Time: 04/15/20  5:14 AM   Specimen: Vein  Result Value Ref Range Status   Aspergillus Ag, BAL/Serum 0.02 0.00 - 0.49 Index Final    Comment: (NOTE) Performed At: [facility] [place], [place] [id number] Rush Farmer MD [id number]          Radiology Studies: No results found.      Scheduled Meds: . albuterol  2.5 mg Nebulization Once  . furosemide  40 mg Intravenous Once  . insulin aspart  0-15 Units Subcutaneous TID WC  . insulin aspart  0-5 Units Subcutaneous QHS  . insulin aspart  4 Units Subcutaneous TID WC  . ipratropium-albuterol  3 mL Nebulization TID  . multivitamin with minerals  1 tablet Oral Daily  . valACYclovir  500 mg Oral Daily   Continuous Infusions: . ampicillin-sulbactam (UNASYN) IV 3 g (04/18/20 0833)     LOS: 5 days  Time spent: 45 minutes    Sharen Hones, MD [facility]   To contact the attending provider between 7A-7P or the covering provider during after hours 7P-7A, please log into the web site [email or web address] and  access using universal [place] password for that web site. If you do not have the password, please call the hospital operator.  04/18/2020, 10:38 AM

## 2020-04-18 NOTE — Progress Notes (Signed)
Inpatient Diabetes Program Recommendations  AACE/ADA: New Consensus Statement on Inpatient Glycemic Control  Target Ranges:  Prepandial:   less than 140 mg/dL      Peak postprandial:   less than 180 mg/dL (1-2 hours)      Critically ill patients:  140 - 180 mg/dL   Results for [name], [name] (MRN [id number]) as of 04/18/2020 11:30  Ref. Range 04/17/2020 08:08 04/17/2020 11:32 04/17/2020 15:37 04/17/2020 21:39 04/18/2020 06:13 04/18/2020 06:41 04/18/2020 08:55  Glucose-Capillary Latest Ref Range: 70 - 99 mg/dL 347 (H) 448 (H) 434 (H) 220 (H) 41 (LL) 122 (H) 117 (H)   Review of Glycemic Control  Diabetes history:DM2 Outpatient Diabetes medications:None Current orders for Inpatient glycemic control:Lantus 10 units daily, Novolog 0-15 units TID with meals, Novolog 0-5 units QHS, Novolog 4 units TID with meals for meal coverage  Inpatient Diabetes Program Recommendations:    Insulin: Noted Lantus and meal coverage insulin decreased today and steroids are no longer ordered.  Outpatient DM medications: If glucose remains elevated may need to consider prescribing DM medication at time of discharge.  NOTE: In reviewing chart, noted patient was inpatient at [facility] 03/14/20-03/18/20 and per notes in [place] "Recent meds switched from Glimepiride to Glipizide d/t hypoglycemia. OSH Hgb A1c 6.5%. Will not resume medications for diabetes on discharge."   Thanks, Thomas Alderman, RN, MSN, CDE Diabetes Coordinator Inpatient Diabetes Program [phone] (Team Pager from 8am to 5pm)

## 2020-04-18 NOTE — Progress Notes (Signed)
[facility] Hematology/Oncology Progress Note  Date of admission: 05/02/2020  Hospital day:  04/18/2020  Chief Complaint: BAYARD MORE is a 87 y.o. 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  Subjective:  Patient minimally interactive.  Social History: The patient is accompanied by his son today.  Allergies:  Allergies  Allergen Reactions  . Sulfa Antibiotics Rash and Shortness Of Breath    Per patient low doses do not cause issues, only higher doses Pt reports can tolerate low doses of sulfa drugs Pt reports can tolerate low doses of sulfa drugs Per patient low doses do not cause issues, only higher doses   . Levofloxacin Other (See Comments)    Low Blood sugar, Syncope    Scheduled Medications: . albuterol  2.5 mg Nebulization Once  . ipratropium-albuterol  3 mL Nebulization TID    Review of Systems  Unable to perform ROS: Medical condition   His son notes ongoing throat pain and inability to swallow. Shortness of breath secondary to ongoing aspiration.  Vitals: Blood pressure 113/74, pulse (!) 124, temperature 99.2 F (37.3 C), resp. rate 20, height _0  (1.753 m), weight 173 lb 8 oz (78.7 kg), SpO2 100 %.   Physical Exam Vitals and nursing note reviewed.  Constitutional:      Appearance: He is ill-appearing.     Comments: Patient minimally responsive.  HENT:     Head: Normocephalic and atraumatic.  Eyes:     Pupils: Pupils are equal, round, and reactive to light.  Cardiovascular:     Rate and Rhythm: Tachycardia present.  Pulmonary:     Comments: Coarse breath sounds through out. Abdominal:     General: Bowel sounds are normal.     Palpations: Abdomen is soft. There is no hepatomegaly or splenomegaly.  Neurological:     Comments: Patient does not engage in conversation.     Results for orders placed or performed during the hospital encounter of 04/20/2020  (from the past 48 hour(s))  Glucose, capillary     Status: Abnormal   Collection Time: 04/16/20  9:16 PM  Result Value Ref Range   Glucose-Capillary 413 (H) 70 - 99 mg/dL    Comment: Glucose reference range applies only to samples taken after fasting for at least 8 hours.  Glucose, capillary     Status: Abnormal   Collection Time: 04/17/20 12:20 AM  Result Value Ref Range   Glucose-Capillary 304 (H) 70 - 99 mg/dL    Comment: Glucose reference range applies only to samples taken after fasting for at least 8 hours.  Glucose, capillary     Status: Abnormal   Collection Time: 04/17/20  1:40 AM  Result Value Ref Range   Glucose-Capillary 352 (H) 70 - 99 mg/dL    Comment: Glucose reference range applies only to samples taken after fasting for at least 8 hours.  CBC with Differential     Status: Abnormal   Collection Time: 04/17/20  4:47 AM  Result Value Ref Range   WBC 0.5 (LL) 4.0 - 10.5 K/uL    Comment: CRITICAL VALUE NOTED.  VALUE IS CONSISTENT WITH PREVIOUSLY REPORTED AND CALLED VALUE.   RBC 2.66 (L) 4.22 - 5.81 MIL/uL   Hemoglobin 9.1 (L) 13.0 - 17.0 g/dL   HCT 26.3 (L) 39.0 - 52.0 %   MCV 98.9 80.0 - 100.0 fL   MCH 34.2 (H) 26.0 - 34.0 pg   MCHC  34.6 30.0 - 36.0 g/dL   RDW 19.4 (H) 11.5 - 15.5 %   Platelets 11 (LL) 150 - 400 K/uL    Comment: Immature Platelet Fraction may be clinically indicated, consider ordering this additional test [id number] CRITICAL VALUE NOTED.  VALUE IS CONSISTENT WITH PREVIOUSLY REPORTED AND CALLED VALUE.    nRBC 0.0 0.0 - 0.2 %   Neutrophils Relative % 18 %   Neutro Abs 0.1 (LL) 1.7 - 7.7 K/uL    Comment: REPEATED TO VERIFY   Lymphocytes Relative 31 %   Lymphs Abs 0.1 (L) 0.7 - 4.0 K/uL   Monocytes Relative 31 %   Monocytes Absolute 0.1 0.1 - 1.0 K/uL   Eosinophils Relative 0 %   Eosinophils Absolute 0.0 0.0 - 0.5 K/uL   Basophils Relative 0 %   Basophils Absolute 0.0 0.0 - 0.1 K/uL   WBC Morphology See Note     Comment: TOO FEW TO COUNT   RBC  Morphology MORPHOLOGY UNREMARKABLE    Smear Review PLATELETS APPEAR DECREASED    Immature Granulocytes 20 %   Abs Immature Granulocytes 0.09 (H) 0.00 - 0.07 K/uL    Comment: Performed at [facility], [address], [place], [place] [address]  Basic metabolic panel     Status: Abnormal   Collection Time: 04/17/20  4:47 AM  Result Value Ref Range   Sodium 142 135 - 145 mmol/L   Potassium 4.3 3.5 - 5.1 mmol/L   Chloride 110 98 - 111 mmol/L   CO2 24 22 - 32 mmol/L   Glucose, Bld 357 (H) 70 - 99 mg/dL    Comment: Glucose reference range applies only to samples taken after fasting for at least 8 hours.   BUN 71 (H) 8 - 23 mg/dL   Creatinine, Ser 1.73 (H) 0.61 - 1.24 mg/dL   Calcium 8.7 (L) 8.9 - 10.3 mg/dL   GFR, Estimated 37 (L) >60 mL/min    Comment: (NOTE) Calculated using the CKD-EPI Creatinine Equation (2021)    Anion gap 8 5 - 15    Comment: Performed at [facility], [address] [place],  [address]  Glucose, capillary     Status: Abnormal   Collection Time: 04/17/20  8:08 AM  Result Value Ref Range   Glucose-Capillary 347 (H) 70 - 99 mg/dL    Comment: Glucose reference range applies only to samples taken after fasting for at least 8 hours.  Glucose, capillary     Status: Abnormal   Collection Time: 04/17/20 11:32 AM  Result Value Ref Range   Glucose-Capillary 448 (H) 70 - 99 mg/dL    Comment: Glucose reference range applies only to samples taken after fasting for at least 8 hours.  Glucose, capillary     Status: Abnormal   Collection Time: 04/17/20  3:37 PM  Result Value Ref Range   Glucose-Capillary 434 (H) 70 - 99 mg/dL    Comment: Glucose reference range applies only to samples taken after fasting for at least 8 hours.  Glucose, capillary     Status: Abnormal   Collection Time: 04/17/20  9:39 PM  Result Value Ref Range   Glucose-Capillary 220 (H) 70 - 99 mg/dL    Comment: Glucose reference range applies only to samples taken after fasting  for at least 8 hours.  Basic metabolic panel     Status: Abnormal   Collection Time: 04/18/20  4:45 AM  Result Value Ref Range   Sodium 149 (H) 135 - 145 mmol/L  Potassium 4.1 3.5 - 5.1 mmol/L   Chloride 114 (H) 98 - 111 mmol/L   CO2 27 22 - 32 mmol/L   Glucose, Bld 51 (L) 70 - 99 mg/dL    Comment: Glucose reference range applies only to samples taken after fasting for at least 8 hours.   BUN 67 (H) 8 - 23 mg/dL   Creatinine, Ser 1.39 (H) 0.61 - 1.24 mg/dL   Calcium 8.7 (L) 8.9 - 10.3 mg/dL   GFR, Estimated 48 (L) >60 mL/min    Comment: (NOTE) Calculated using the CKD-EPI Creatinine Equation (2021)    Anion gap 8 5 - 15    Comment: Performed at [facility], [address] [place], [place] [address]  Magnesium     Status: None   Collection Time: 04/18/20  4:45 AM  Result Value Ref Range   Magnesium 2.0 1.7 - 2.4 mg/dL    Comment: Performed at [facility], [address] [place], [place] [address]  CBC with Differential     Status: Abnormal   Collection Time: 04/18/20  4:45 AM  Result Value Ref Range   WBC 0.7 (LL) 4.0 - 10.5 K/uL    Comment: CRITICAL VALUE NOTED.  VALUE IS CONSISTENT WITH PREVIOUSLY REPORTED AND CALLED VALUE.   RBC 2.84 (L) 4.22 - 5.81 MIL/uL   Hemoglobin 9.3 (L) 13.0 - 17.0 g/dL   HCT 27.6 (L) 39.0 - 52.0 %   MCV 97.2 80.0 - 100.0 fL   MCH 32.7 26.0 - 34.0 pg   MCHC 33.7 30.0 - 36.0 g/dL   RDW 19.3 (H) 11.5 - 15.5 %   Platelets 8 (LL) 150 - 400 K/uL    Comment: Immature Platelet Fraction may be clinically indicated, consider ordering this additional test [id number] CRITICAL VALUE NOTED.  VALUE IS CONSISTENT WITH PREVIOUSLY REPORTED AND CALLED VALUE.    nRBC 0.0 0.0 - 0.2 %   Neutrophils Relative % 39 %   Neutro Abs 0.3 (LL) 1.7 - 7.7 K/uL    Comment: This critical result has verified and been called to [facility] by Lily Peer on 04 07 2022 at 0608, and has been read back.    Lymphocytes Relative 30 %   Lymphs Abs 0.2 (L)  0.7 - 4.0 K/uL   Monocytes Relative 31 %   Monocytes Absolute 0.2 0.1 - 1.0 K/uL   Eosinophils Relative 0 %   Eosinophils Absolute 0.0 0.0 - 0.5 K/uL   Basophils Relative 0 %   Basophils Absolute 0.0 0.0 - 0.1 K/uL   RBC Morphology MORPHOLOGY UNREMARKABLE    Smear Review Normal platelet morphology    Immature Granulocytes 0 %   Abs Immature Granulocytes 0.00 0.00 - 0.07 K/uL    Comment: Performed at [facility], [address] [place], [place] [address]  Glucose, capillary     Status: Abnormal   Collection Time: 04/18/20  6:13 AM  Result Value Ref Range   Glucose-Capillary 41 (LL) 70 - 99 mg/dL    Comment: Glucose reference range applies only to samples taken after fasting for at least 8 hours.   Comment 1 Notify RN   Glucose, capillary     Status: Abnormal   Collection Time: 04/18/20  6:41 AM  Result Value Ref Range   Glucose-Capillary 122 (H) 70 - 99 mg/dL    Comment: Glucose reference range applies only to samples taken after fasting for at least 8 hours.  Prepare platelet pheresis     Status: None (  Preliminary result)   Collection Time: 04/18/20  7:00 AM  Result Value Ref Range   Unit Number [id number]    Blood Component Type PLTP2 PSORALEN TREATED    Unit division 00    Status of Unit ISSUED    Transfusion Status      OK TO TRANSFUSE Performed at [facility], [address] [place], [place] [address]   Glucose, capillary     Status: Abnormal   Collection Time: 04/18/20  8:55 AM  Result Value Ref Range   Glucose-Capillary 117 (H) 70 - 99 mg/dL    Comment: Glucose reference range applies only to samples taken after fasting for at least 8 hours.   DG Chest Port 1 View  Result Date: 04/18/2020 CLINICAL DATA:  Shortness of breath, fever. EXAM: PORTABLE CHEST 1 VIEW COMPARISON:  April 13, 2020. FINDINGS: The heart size and mediastinal contours are within normal limits. No pneumothorax is noted. Left-sided pacemaker is unchanged in position. Stable  bilateral lung opacities are noted concerning for multifocal pneumonia. Small left pleural effusion may be present. The visualized skeletal structures are unremarkable. IMPRESSION: Stable bilateral lung opacities are noted concerning for multifocal pneumonia. Electronically Signed   By: Marijo Conception M.D.   On: 04/18/2020 11:17    Assessment:  SABRINA ARRIAGA is a 87 y.o. 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  Chest CT on 04/14/2020 revealed consolidation and airspace disease in the posterior RUL c/w pneumonia versus aspiration pneumonitis.  There is a smaller foci of consolidation at the lung bases also concerning for pneumonia versus pneumonitis.  Symptomatically, he has had recurrent aspiration.  He has transitioned to comfort care.  Plan: 1. Acute myelogenous leukemia  Patient diagnosed with AML on 03/14/2020.  He declined bone marrow biopsy and treatment.  Patient received 1 unit pheresed platelets today for platelet count of 8,000.    He was receiving supportive care only with transfusions and antibiotics.   Antibiotics and transfusion support discontinued today after decision to pursue Hospice.   Patient's son's notes no plan for further labs.  Support provided to patient and his son. 2. 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  Patient and son request discontinued radiation secondary to QOL issues.  He has had persistent oral pain s/p radiation. 3. Multifocal pneumonia  Patient with recurrent aspiration.    Decison made to purse comfort care.  Antibiotics discontinued. 4. Code status  DNR.  Thank you for allowing me to participate in Mr. Whichard' care.  I will follow him closely with you while hospitalized.  Please do not hesitate to contact me if any questions or concerns.   Lequita Asal, MD  04/18/2020, 8:33 PM

## 2020-04-18 NOTE — Progress Notes (Signed)
   04/18/20 1025  Clinical Encounter Type  Visited With Patient;Family  Visit Type Initial;Spiritual support;Social support  Referral From Nurse  Consult/Referral To Chaplain  Stress Factors  Family Stress Factors Health changes   Chaplain responded to a rapid response. Chaplain ministered with a calming presence. Chaplain will try to follow back up with PT and his family.

## 2020-04-18 NOTE — Care Management Important Message (Signed)
Important Message  Patient Details  Name: Thomas Morton MRN: [id number] Date of Birth: 1927-07-31   Medicare Important Message Given:  Other (see comment)  Patient placed on comfort care today and out of respect for the patient and family no Important Message from Medicare given.  Juliann Pulse A Twylah Bennetts 04/18/2020, 11:35 AM

## 2020-04-18 NOTE — Progress Notes (Addendum)
[place] [facility]) Hospital Liaison RN note:  Received request from Dr. Roosevelt Locks and Doran Clay, [facility] for family interest in Thomas Morton. Chart reviewed and eligibility was approved. Spoke with son, Thomas Morton at bedside to confirm interests and explain services. He verbalized understanding. Unfortunately, [facility] is not able to offer a room today. Family and hospital care team are aware. [place] Liaison will follow for room availability.  Please call with any hospice related questions or concerns.  Thank you for the opportunity to participate in this patient's care.  Zandra Abts, RN [facility] Liaison [phone]

## 2020-04-18 NOTE — Significant Event (Signed)
Rapid Response Event Note   Reason for Call :  Red MEWS  Initial Focused Assessment:  Rapid response nurse arrived in patient's room with in bed surrounded by patient's RN Erin, 1C staff, RT, and AC. Patient with labored respirations, lots of coarse crackles on auscultation (audible even without stethoscope). Erin RN was preparing platelet infusion ordered for platelet level of 8. See vital sign flowsheet for vital signs. Dr. Roosevelt Locks paged.  Interventions:  Freda RT gave patient breathing treatment. Dr. Roosevelt Locks ordered lasix post platelet infusion and portable chest x-ray.  Plan of Care:  Platelets started by Erin RN and Danae Chen RN. Plan for patient to remain on 1C. 1C staff instructed to recall rapid response if needed. Discussions occurring about potential to transfer patient to hospice care.  Event Summary:   MD Notified: Dr. Roosevelt Locks Call Time: 10:21 Arrival Time: 10:23 End Time: 10:35  Stephanie Acre, RN

## 2020-04-19 ENCOUNTER — Ambulatory Visit: Payer: Medicare HMO

## 2020-04-19 ENCOUNTER — Inpatient Hospital Stay: Payer: Medicare HMO

## 2020-04-19 LAB — BPAM PLATELET PHERESIS
Blood Product Expiration Date: 202204072359
ISSUE DATE / TIME: 202204071008
Unit Type and Rh: 5100

## 2020-04-19 LAB — PREPARE PLATELET PHERESIS: Unit division: 0

## 2020-04-19 MED ORDER — MORPHINE BOLUS VIA INFUSION
5.0000 mg | Freq: Once | INTRAVENOUS | Status: AC
  Administered 2020-04-19: 03:00:00 5 mg via INTRAVENOUS
  Filled 2020-04-19: qty 5

## 2020-04-19 MED ORDER — CHLORHEXIDINE GLUCONATE 0.12 % MT SOLN: 15.0000 mL | Freq: Two times a day (BID) | OROMUCOSAL | Status: DC

## 2020-04-19 MED ORDER — ORAL CARE MOUTH RINSE: 15.0000 mL | Freq: Two times a day (BID) | OROMUCOSAL | Status: DC

## 2020-04-20 ENCOUNTER — Ambulatory Visit: Payer: Medicare HMO

## 2020-04-22 ENCOUNTER — Inpatient Hospital Stay: Payer: Medicare HMO

## 2020-04-22 ENCOUNTER — Ambulatory Visit: Payer: Medicare HMO

## 2020-04-23 ENCOUNTER — Ambulatory Visit: Payer: Medicare HMO

## 2020-04-23 ENCOUNTER — Inpatient Hospital Stay: Payer: Medicare HMO

## 2020-04-24 ENCOUNTER — Ambulatory Visit: Payer: Medicare HMO

## 2020-04-25 ENCOUNTER — Inpatient Hospital Stay: Payer: Medicare HMO

## 2020-04-25 ENCOUNTER — Ambulatory Visit: Payer: Medicare HMO

## 2020-04-26 ENCOUNTER — Inpatient Hospital Stay: Payer: Medicare HMO

## 2020-04-26 ENCOUNTER — Ambulatory Visit: Payer: Medicare HMO

## 2020-04-29 ENCOUNTER — Ambulatory Visit: Payer: Medicare HMO

## 2020-04-29 ENCOUNTER — Inpatient Hospital Stay: Payer: Medicare HMO

## 2020-04-29 ENCOUNTER — Other Ambulatory Visit: Payer: Medicare HMO

## 2020-04-30 ENCOUNTER — Ambulatory Visit: Payer: Medicare HMO

## 2020-04-30 ENCOUNTER — Ambulatory Visit: Payer: Medicare HMO | Admitting: Hematology and Oncology

## 2020-05-01 ENCOUNTER — Ambulatory Visit: Payer: Medicare HMO

## 2020-05-02 ENCOUNTER — Ambulatory Visit: Payer: Medicare HMO

## 2020-05-03 ENCOUNTER — Ambulatory Visit: Payer: Medicare HMO

## 2020-05-06 ENCOUNTER — Ambulatory Visit: Payer: Medicare HMO

## 2020-05-07 ENCOUNTER — Ambulatory Visit: Payer: Medicare HMO

## 2020-05-12 NOTE — Progress Notes (Signed)
Thomas Morton Nurse reported patient passed.  Death confirmed at bedisde with no pulse, spontaneous respiration, neuro response.  Time of death 0530 am on this day May 13, 2020.  Son Fordyce Lepak informed over phone.

## 2020-05-12 NOTE — Death Summary Note (Signed)
DEATH SUMMARY   Patient Details  Name: Thomas Morton MRN: [id number] DOB: [birth year]  Admission/Discharge Information   Admit Date:  04-24-20  Date of Death: Date of Death: 30-Apr-2020  Time of Death: Time of Death: 0530  Length of Stay: 6  Referring Physician: Rusty Aus, MD   Reason(s) for Hospitalization  Severe sepsis, aspiration pneumonia, neutrapenic fever   Diagnoses  Preliminary cause of death:  Secondary Diagnoses (including complications and co-morbidities):  Active Problems:   Chronic renal insufficiency   Essential hypertension   Acute respiratory failure with hypoxemia (HCC)   Aspiration pneumonia (HCC)   History of cardioembolic cerebrovascular accident (CVA)   Atrial fibrillation, chronic (HCC)   Pancytopenia (HCC)   Laryngeal cancer (HCC)   Chronic systolic CHF (congestive heart failure) (HCC)   Acute myeloid leukemia not having achieved remission (HCC)   Cardiac defibrillator in situ   History of prostate cancer   LBBB (left bundle branch block)   Mild emphysema (HCC)   Severe sepsis (HCC)   Neutropenic fever (HCC)   Chronic kidney disease, stage 3a ([place]) Severe sepsissecondary to neutropenia and aspiration pneumonia. Neutropenic fever. Bilateral lower lobe aspiration pneumonia. Acute hypoxemia secondary to aspiration pneumonia Acute metabolic encephalopathy  AML with pancytopenia. Severe neutropenia and thrombocytopenia. Uncontrolled type 2 diabetes with hyperglycemia and hypoglycemia. Hypernatremia.  Brief Hospital Course (including significant findings, care, treatment, and services provided and events leading to death)   Patient is a 87 year old male with recent diagnosis of AML,history of laryngeal cancer, atrial fibrillation, stroke, essential hypertension, chronic systolic congestive heart failure, COPD, who present to the hospital with weakness. He also had neutropenic fever with a severe sepsis. He was started on antibiotics  initially with vancomycin and cefepime and Flagyl.  4/6.Vancomycin discontinued yesteray. Patient has clear evidence of aspiration pneumonia, antibiotic changed to Unasyn. Obtainpalliative care consult. Patient continued to aspirate, he developed worsening short of breath and aspiration pneumonia due to aspiration on 4/7.  After discussion with the family, patient was transitioned to comfort care with planning for hospice transfer. Patient condition deteriorated on the night of 4/7, was placed on morphine drip.  Patient passed May 01, 2022 at 5:30 AM.   Pertinent Labs and Studies  Significant Diagnostic Studies CT CHEST WO CONTRAST  Result Date: 04/14/2020 CLINICAL DATA:  Respiratory failure EXAM: CT CHEST WITHOUT CONTRAST TECHNIQUE: Multidetector CT imaging of the chest was performed following the standard protocol without IV contrast. COMPARISON:  Radiograph 04/24/2020, CT 03/30/2018 FINDINGS: Cardiovascular: LEFT-sided pacemaker with RIGHT heart leads. No pericardial effusion. Mediastinum/Nodes: No axillary or supraclavicular adenopathy. No mediastinal or hilar adenopathy. No pericardial fluid. Esophagus normal. Lungs/Pleura: Airspace opacity and with focal consolidation within the posterior aspect of the RIGHT upper lobe (images 37 through 56 series 3). Small foci of consolidation at the LEFT and RIGHT lung base (image 107). No pneumothorax. No suspicious nodularity. Upper Abdomen: Limited view of the liver, kidneys, pancreas are unremarkable. Normal adrenal glands. Musculoskeletal: No aggressive osseous lesion. IMPRESSION: 1. Consolidation and airspace disease in the posterior RIGHT upper lobe consistent with pneumonia versus aspiration pneumonitis. 2. Smaller foci of consolidation at the lung bases also concerning for pneumonia versus pneumonitis. 3. No suspicious nodularity. 4. No mediastinal lymphadenopathy Electronically Signed   By: Suzy Bouchard M.D.   On: 04/14/2020 12:57   DG Chest Port 1  View  Result Date: 04/18/2020 CLINICAL DATA:  Shortness of breath, fever. EXAM: PORTABLE CHEST 1 VIEW COMPARISON:  04-24-2020. FINDINGS: The heart size and mediastinal contours  are within normal limits. No pneumothorax is noted. Left-sided pacemaker is unchanged in position. Stable bilateral lung opacities are noted concerning for multifocal pneumonia. Small left pleural effusion may be present. The visualized skeletal structures are unremarkable. IMPRESSION: Stable bilateral lung opacities are noted concerning for multifocal pneumonia. Electronically Signed   By: Marijo Conception M.D.   On: 04/18/2020 11:17   DG Chest Port 1 View  Result Date: 04/27/2020 CLINICAL DATA:  Questionable sepsis.  Shortness of breath and fever. EXAM: PORTABLE CHEST 1 VIEW COMPARISON:  March 11, 2020 FINDINGS: Continued infiltrate in the right upper lobe. Mild increased opacity in the right base. Small left effusion. Mild opacity in the left base seen as well, unchanged. The cardiomediastinal silhouette is stable. No pneumothorax. No other acute abnormalities. IMPRESSION: Persistent infiltrate in the right upper lobe. Increasing opacity in the right lower lobe. Stable mild opacity in left base may represent atelectasis. The findings are still concerning for multifocal pneumonia, mildly worsened in the interval. However, the opacity in the right upper lobe has been present for greater than a month. CT imaging could further evaluate as clinically warranted. Otherwise, recommend treatment with a short-term follow-up chest x-ray to ensure resolution. Electronically Signed   By: Dorise Bullion III M.D   On: 05/03/2020 18:30    Microbiology Recent Results (from the past 240 hour(s))  Culture, blood (Routine X 2) w Reflex to ID Panel     Status: None   Collection Time: 04/12/20  1:10 PM   Specimen: BLOOD  Result Value Ref Range Status   Specimen Description   Final    BLOOD RIGHT ARM Performed at [facility], [address], [place], [place] [address]    Special Requests   Final    BOTTLES DRAWN AEROBIC AND ANAEROBIC Blood Culture adequate volume Performed at [facility], [address], [place], [place] [address]    Culture   Final    NO GROWTH 5 DAYS Performed at [facility], [address] [place], [place] [address]    Report Status 04/17/2020 FINAL  Final  Culture, blood (Routine X 2) w Reflex to ID Panel     Status: None   Collection Time: 04/12/20  1:15 PM   Specimen: BLOOD  Result Value Ref Range Status   Specimen Description   Final    BLOOD LEFT ARM Performed at [facility], [address], [place], [place] [address]    Special Requests   Final    BOTTLES DRAWN AEROBIC AND ANAEROBIC Blood Culture adequate volume Performed at [facility], [address] [place], [place] [address]    Culture   Final    NO GROWTH 5 DAYS Performed at [facility], [address], [place], [place] [address]    Report Status 04/17/2020 FINAL  Final  Urine Culture     Status: None   Collection Time: 04/12/20  3:01 PM   Specimen: Urine, Random  Result Value Ref Range Status   Specimen Description   Final    URINE, RANDOM Performed at [facility], [address], [place], [place] [address]    Special Requests   Final    NONE Performed at [facility], [address] [place], [place] [address]    Culture   Final    NO GROWTH Performed at [facility], [place] [address], [place],  [address]    Report Status 04/14/2020 FINAL  Final  Resp  Panel by RT-PCR (Flu A&B, Covid) Nasopharyngeal Swab     Status: None   Collection Time: 04/30/2020  5:56 PM   Specimen: Nasopharyngeal Swab; Nasopharyngeal(NP) swabs in vial transport medium  Result Value Ref Range Status   SARS Coronavirus 2 by RT PCR NEGATIVE NEGATIVE Final    Comment: (NOTE) SARS-CoV-2 target nucleic acids are NOT DETECTED.  The SARS-CoV-2 RNA is generally  detectable in upper respiratory specimens during the acute phase of infection. The lowest concentration of SARS-CoV-2 viral copies this assay can detect is 138 copies/mL. A negative result does not preclude SARS-Cov-2 infection and should not be used as the sole basis for treatment or other patient management decisions. A negative result may occur with  improper specimen collection/handling, submission of specimen other than nasopharyngeal swab, presence of viral mutation(s) within the areas targeted by this assay, and inadequate number of viral copies(<138 copies/mL). A negative result must be combined with clinical observations, patient history, and epidemiological information. The expected result is Negative.  Fact Sheet for Patients:  EntrepreneurPulse.com.au  Fact Sheet for Healthcare Providers:  IncredibleEmployment.be  This test is no t yet approved or cleared by the [place] FDA and  has been authorized for detection and/or diagnosis of SARS-CoV-2 by FDA under an Emergency Use Authorization (EUA). This EUA will remain  in effect (meaning this test can be used) for the duration of the COVID-19 declaration under Section 564(b)(1) of the Act, 21 U.S.C.section 360bbb-3(b)(1), unless the authorization is terminated  or revoked sooner.       Influenza A by PCR NEGATIVE NEGATIVE Final   Influenza B by PCR NEGATIVE NEGATIVE Final    Comment: (NOTE) The Xpert Xpress SARS-CoV-2/FLU/RSV plus assay is intended as an aid in the diagnosis of influenza from Nasopharyngeal swab specimens and should not be used as a sole basis for treatment. Nasal washings and aspirates are unacceptable for Xpert Xpress SARS-CoV-2/FLU/RSV testing.  Fact Sheet for Patients: EntrepreneurPulse.com.au  Fact Sheet for Healthcare Providers: IncredibleEmployment.be  This test is not yet approved or cleared by the [place] FDA  and has been authorized for detection and/or diagnosis of SARS-CoV-2 by FDA under an Emergency Use Authorization (EUA). This EUA will remain in effect (meaning this test can be used) for the duration of the COVID-19 declaration under Section 564(b)(1) of the Act, 21 U.S.C. section 360bbb-3(b)(1), unless the authorization is terminated or revoked.  Performed at [facility], [address] [place], [place] [address]   Blood Culture (routine x 2)     Status: None   Collection Time: 04/28/2020  6:06 PM   Specimen: BLOOD  Result Value Ref Range Status   Specimen Description BLOOD  RIGHT [facility]  Final   Special Requests   Final    BOTTLES DRAWN AEROBIC AND ANAEROBIC Blood Culture adequate volume   Culture   Final    NO GROWTH 5 DAYS Performed at [facility], [address], [place], [place] [address]    Report Status 04/18/2020 FINAL  Final  Blood Culture (routine x 2)     Status: None   Collection Time: 04/25/2020  6:07 PM   Specimen: BLOOD  Result Value Ref Range Status   Specimen Description BLOOD  RIGHT HAND  Final   Special Requests   Final    BOTTLES DRAWN AEROBIC AND ANAEROBIC Blood Culture results may not be optimal due to an inadequate volume of blood received in culture bottles   Culture   Final    NO  GROWTH 5 DAYS Performed at [facility], [address] [place], [place] [address]    Report Status 04/18/2020 FINAL  Final  Aspergillus Ag, BAL/Serum     Status: None   Collection Time: 04/15/20  5:14 AM   Specimen: Vein  Result Value Ref Range Status   Aspergillus Ag, BAL/Serum 0.02 0.00 - 0.49 Index Final    Comment: (NOTE) Performed At: [facility] [place], [place] [id number] Rush Farmer MD OF:[id number]     Lab Basic Metabolic Panel: Recent Labs  Lab 04/14/20 0736 04/15/20 0514 04/16/20 0051 04/17/20 0447 04/18/20 0445  NA 137 138 139 142 149*  K 4.1 4.3 3.9 4.3 4.1  CL 102 106 104 110 114*  CO2 26  26 24 24 27   GLUCOSE 240* 415* 493* 357* 51*  BUN 31* 44* 69* 71* 67*  CREATININE 1.61* 1.41* 1.95* 1.73* 1.39*  CALCIUM 8.1* 8.1* 8.4* 8.7* 8.7*  MG  --   --   --   --  2.0   Liver Function Tests: Recent Labs  Lab 04/28/2020 1806 04/14/20 0736  AST 20 16  ALT 12 12  ALKPHOS 69 55  BILITOT 0.8 0.7  PROT 6.9 6.1*  ALBUMIN 3.2* 2.7*   No results for input(s): LIPASE, AMYLASE in the last 168 hours. No results for input(s): AMMONIA in the last 168 hours. CBC: Recent Labs  Lab 04/12/20 1311 04/17/2020 1806 04/14/20 0736 04/15/20 0515 04/16/20 0413 04/17/20 0447 04/18/20 0445  WBC 1.6* 1.8* 1.4* 0.6* 0.6* 0.5* 0.7*  NEUTROABS 0.1* 0.1*  --   --  0.1* 0.1* 0.3*  HGB 7.4* 8.8* 7.8* 7.9* 8.9* 9.1* 9.3*  HCT 22.4* 26.2* 23.3* 23.4* 26.5* 26.3* 27.6*  MCV 100.4* 98.1 99.1 99.6 99.6 98.9 97.2  PLT 28* 31* 23* 20* 16* 11* 8*   Cardiac Enzymes: No results for input(s): CKTOTAL, CKMB, CKMBINDEX, TROPONINI in the last 168 hours. Sepsis Labs: Recent Labs  Lab 05/03/2020 1956 05/01/2020 2000 04/14/20 0736 04/15/20 0515 04/16/20 0413 04/17/20 0447 04/18/20 0445  PROCALCITON  --   --  0.14  --   --   --   --   WBC  --   --  1.4* 0.6* 0.6* 0.5* 0.7*  LATICACIDVEN 1.9 1.2  --   --   --   --   --     Procedures/Operations  None   Eidan Muellner 2020/05/16, 7:20 AM

## 2020-05-12 DEATH — deceased

## 2021-01-30 NOTE — Telephone Encounter (Signed)
Signing encounter, see previous note 3/21 °

## 2023-01-24 IMAGING — CT CT HEAD WO/W CM
3 of 4 series · 15 of 47 positions shown, 18 images · IV contrast (APPLIED)
Comparison: Head CT 10/15/2015 and CTA 10/14/2015

CLINICAL DATA: Newly diagnosed laryngeal cancer.  Staging.

EXAM:
CT HEAD WITHOUT AND WITH CONTRAST
TECHNIQUE: Contiguous axial images were obtained from the base of the skull
through the vertex without and with intravenous contrast
CONTRAST:  75mL OMNIPAQUE IOHEXOL 300 MG/ML  SOLN

[Series 2: head wo · axial · 0.39mm/px · z∈[-142,-22]mm · 9 of 28 slices shown, 12 images]
[im 2/28  brain]
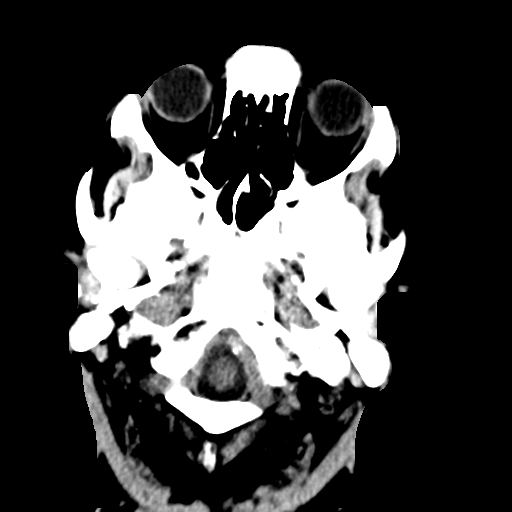
[im 2/28  bone]
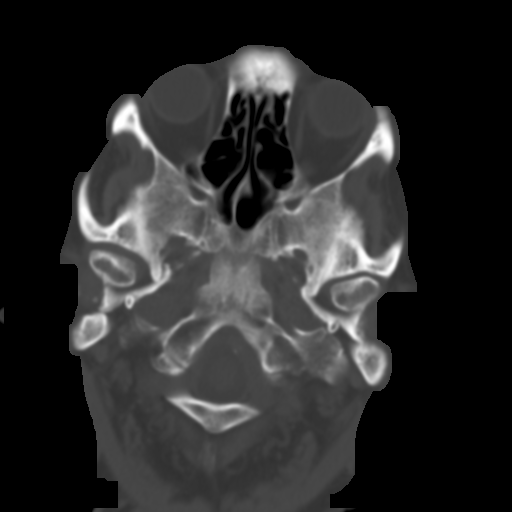
[im 6/28  brain]
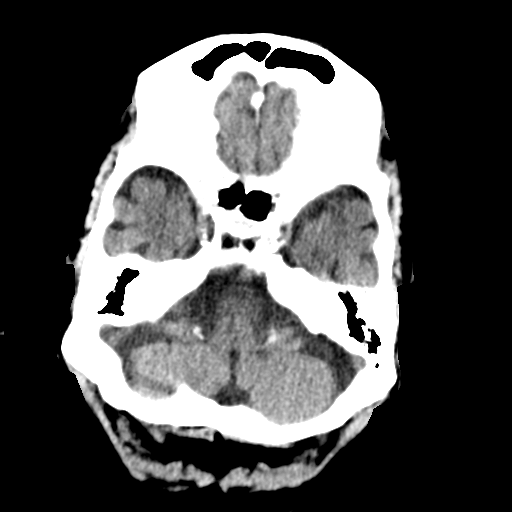
[im 8/28  brain]
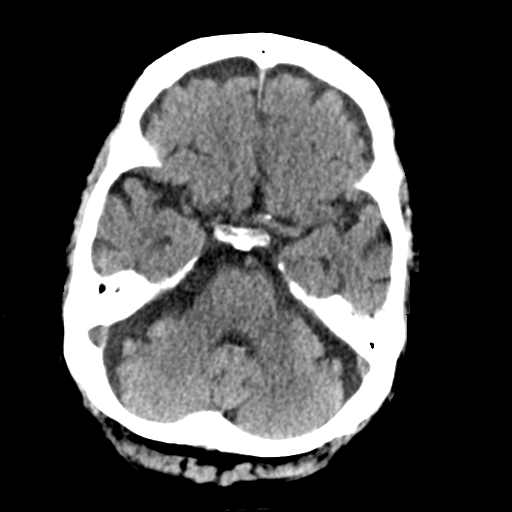
[im 11/28  brain]
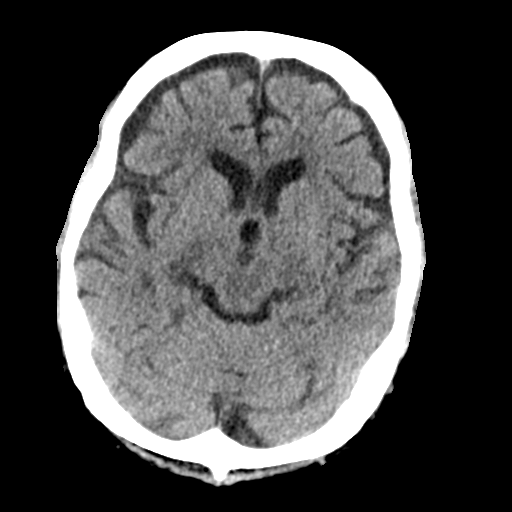
[im 15/28  brain]
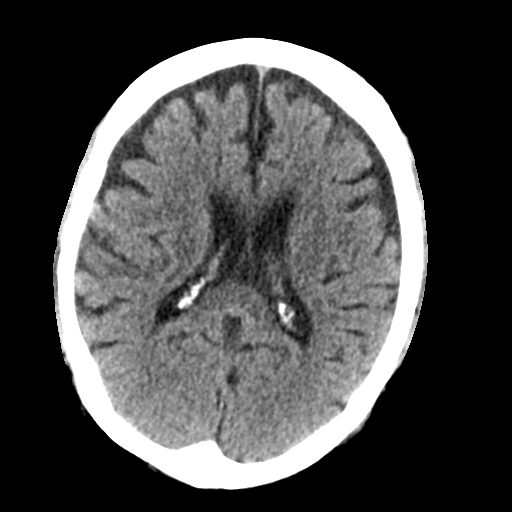
[im 15/28  bone]
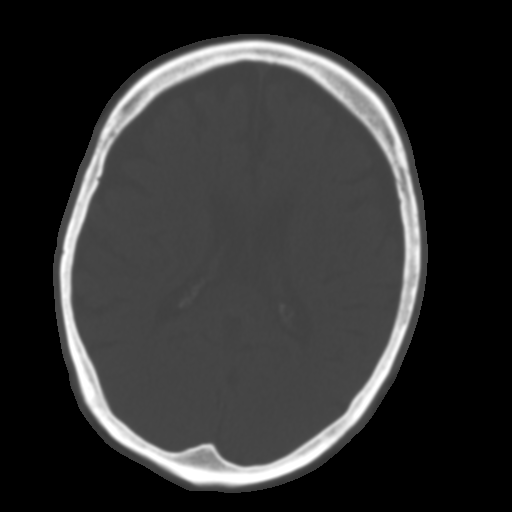
[im 17/28  brain]
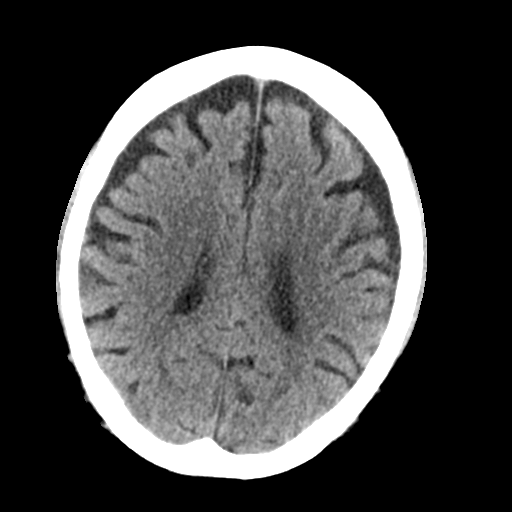
[im 20/28  brain]
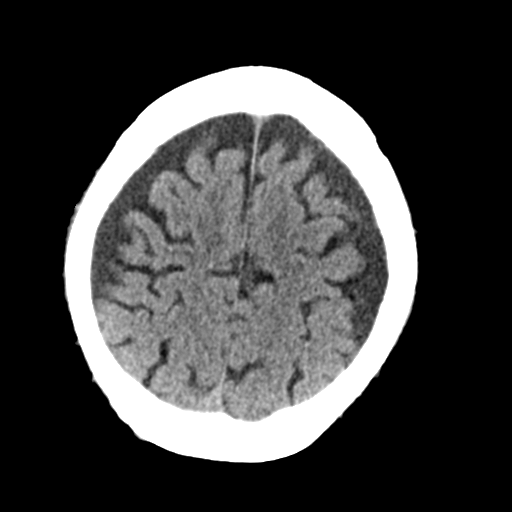
[im 22/28  brain]
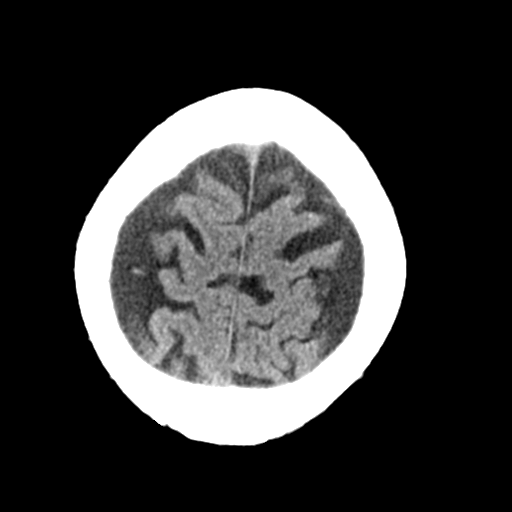
[im 26/28  brain]
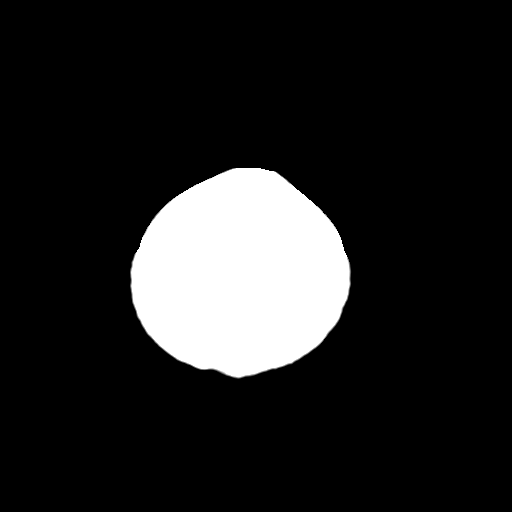
[im 26/28  bone]
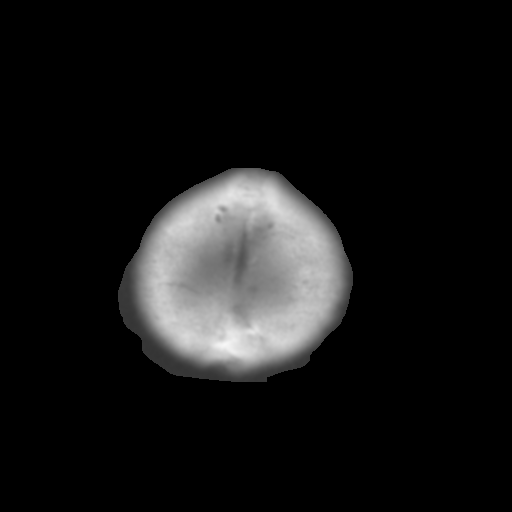

[Series 5: coronal soft tissue · coronal · 0.27mm/px · 3 of 65 slices shown]
[im 22/65  brain]
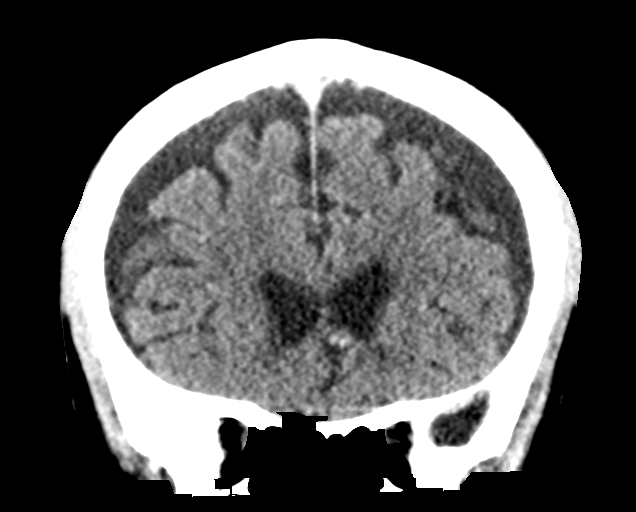
[im 29/65  brain]
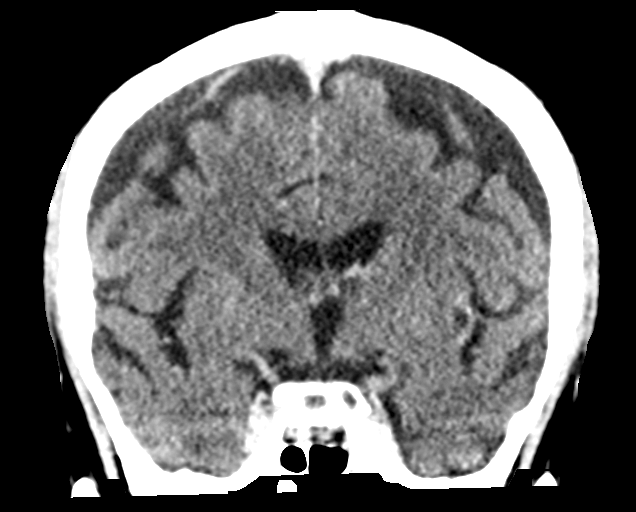
[im 36/65  brain]
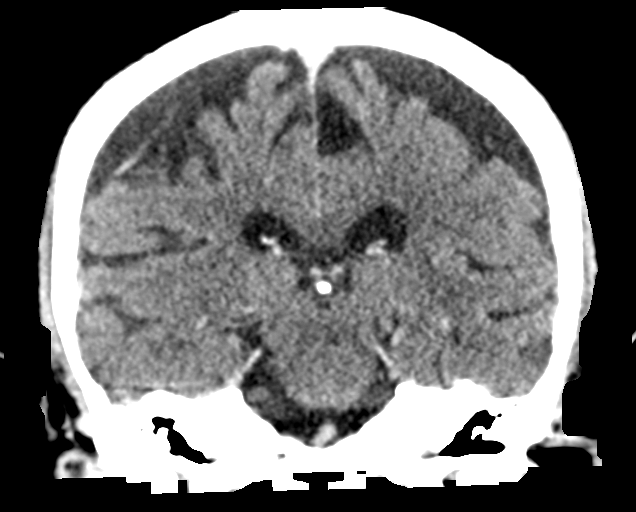

[Series 6: sagittal soft tissue · sagittal · 0.28mm/px · 3 of 61 slices shown]
[im 21/61  brain]
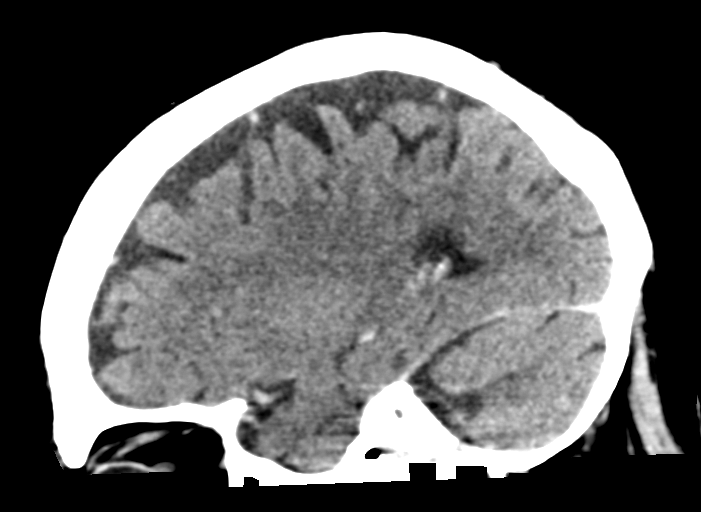
[im 31/61  brain]
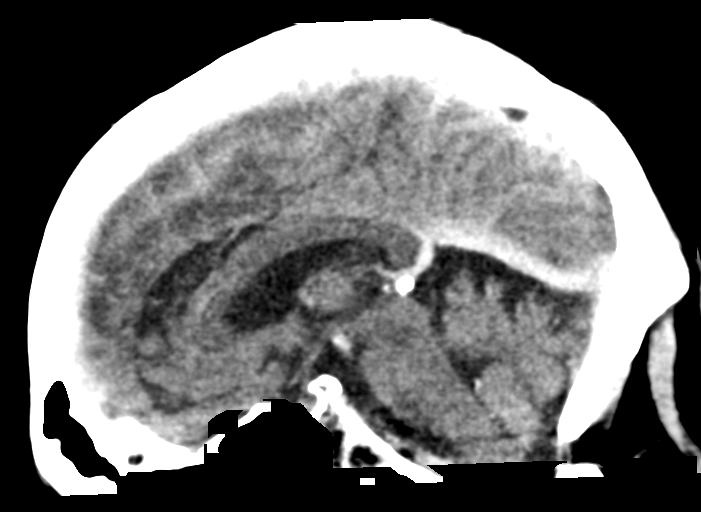
[im 41/61  brain]
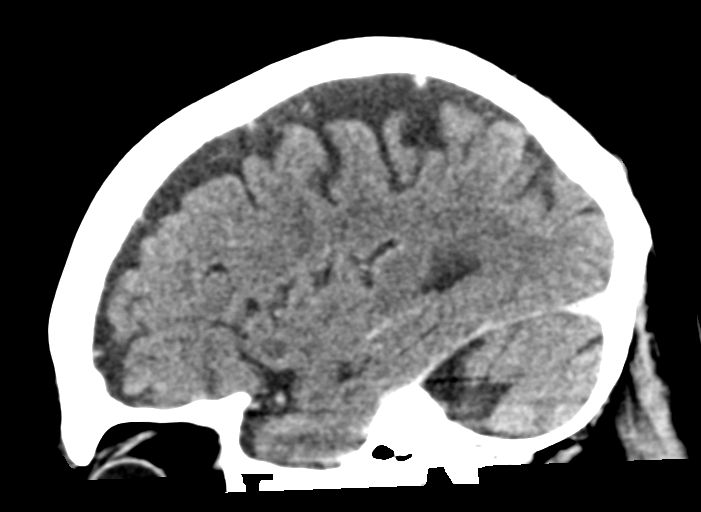

[15 of 47 positions shown; findings below may reference images not displayed]

FINDINGS: Brain: There is no evidence of an acute infarct, intracranial
hemorrhage, mass, midline shift, or extra-axial fluid collection.
Mild cerebral atrophy is not considered abnormal for age. There is a
lacunar infarct in the right thalamus which appears to be new from
6363 but is felt to be chronic. No abnormal enhancement is
identified.

Vascular: Calcified atherosclerosis at the skull base.

Skull: No fracture or suspicious osseous lesion.

Sinuses/Orbits: Minimal mucosal thickening in the paranasal sinuses.
Mild chronic right mastoid air cell opacification. Unremarkable
included orbits.

Other: None.
IMPRESSION: 1. No evidence of intracranial metastatic disease.
2. Right thalamic lacunar infarct, new from 6363 but likely chronic.
